# Patient Record
Sex: Male | Born: 1955
Health system: Southern US, Community
[De-identification: ages and names within clinical notes are randomized; demographics above are authoritative.]

## PROBLEM LIST (undated history)

## (undated) DIAGNOSIS — K219 Gastro-esophageal reflux disease without esophagitis: Secondary | ICD-10-CM

## (undated) DIAGNOSIS — G4700 Insomnia, unspecified: Secondary | ICD-10-CM

## (undated) DIAGNOSIS — G4730 Sleep apnea, unspecified: Secondary | ICD-10-CM

## (undated) DIAGNOSIS — K5790 Diverticulosis of intestine, part unspecified, without perforation or abscess without bleeding: Secondary | ICD-10-CM

## (undated) DIAGNOSIS — E785 Hyperlipidemia, unspecified: Secondary | ICD-10-CM

## (undated) DIAGNOSIS — B356 Tinea cruris: Secondary | ICD-10-CM

## (undated) DIAGNOSIS — M1990 Unspecified osteoarthritis, unspecified site: Secondary | ICD-10-CM

## (undated) DIAGNOSIS — J45909 Unspecified asthma, uncomplicated: Secondary | ICD-10-CM

## (undated) DIAGNOSIS — G4733 Obstructive sleep apnea (adult) (pediatric): Secondary | ICD-10-CM

## (undated) DIAGNOSIS — I10 Essential (primary) hypertension: Secondary | ICD-10-CM

## (undated) DIAGNOSIS — Z9989 Dependence on other enabling machines and devices: Secondary | ICD-10-CM

## (undated) DIAGNOSIS — Q539 Undescended testicle, unspecified: Secondary | ICD-10-CM

## (undated) DIAGNOSIS — E119 Type 2 diabetes mellitus without complications: Secondary | ICD-10-CM

## (undated) DIAGNOSIS — N200 Calculus of kidney: Secondary | ICD-10-CM

## (undated) HISTORY — PX: COLONOSCOPY: SHX174

## (undated) HISTORY — DX: Dependence on other enabling machines and devices: Z99.89

## (undated) HISTORY — DX: Type 2 diabetes mellitus without complications: E11.9

## (undated) HISTORY — DX: Sleep apnea, unspecified: G47.30

## (undated) HISTORY — DX: Essential (primary) hypertension: I10

## (undated) HISTORY — DX: Tinea cruris: B35.6

## (undated) HISTORY — DX: Diverticulosis of intestine, part unspecified, without perforation or abscess without bleeding: K57.90

## (undated) HISTORY — DX: Obstructive sleep apnea (adult) (pediatric): G47.33

## (undated) HISTORY — DX: Hyperlipidemia, unspecified: E78.5

## (undated) HISTORY — DX: Unspecified osteoarthritis, unspecified site: M19.90

## (undated) HISTORY — DX: Unspecified asthma, uncomplicated: J45.909

## (undated) HISTORY — DX: Calculus of kidney: N20.0

## (undated) HISTORY — DX: Gastro-esophageal reflux disease without esophagitis: K21.9

## (undated) HISTORY — DX: Undescended testicle, unspecified: Q53.9

## (undated) HISTORY — DX: Insomnia, unspecified: G47.00

---
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
## 2000-11-19 ENCOUNTER — Ambulatory Visit (HOSPITAL_COMMUNITY): Admission: RE | Admit: 2000-11-19 | Discharge: 2000-11-19 | Payer: Self-pay | Admitting: Internal Medicine

## 2000-11-19 ENCOUNTER — Encounter: Payer: Self-pay | Admitting: Internal Medicine

## 2007-08-05 ENCOUNTER — Ambulatory Visit (HOSPITAL_COMMUNITY): Admission: RE | Admit: 2007-08-05 | Discharge: 2007-08-05 | Payer: Self-pay | Admitting: Gastroenterology

## 2007-11-20 LAB — HM COLONOSCOPY

## 2008-09-04 ENCOUNTER — Encounter: Admission: RE | Admit: 2008-09-04 | Discharge: 2008-09-04 | Payer: Self-pay | Admitting: Internal Medicine

## 2008-09-11 ENCOUNTER — Ambulatory Visit (HOSPITAL_COMMUNITY): Admission: RE | Admit: 2008-09-11 | Discharge: 2008-09-11 | Payer: Self-pay | Admitting: Urology

## 2008-09-13 ENCOUNTER — Ambulatory Visit (HOSPITAL_BASED_OUTPATIENT_CLINIC_OR_DEPARTMENT_OTHER): Admission: RE | Admit: 2008-09-13 | Discharge: 2008-09-13 | Payer: Self-pay | Admitting: Urology

## 2008-09-13 HISTORY — PX: OTHER SURGICAL HISTORY: SHX169

## 2008-09-22 ENCOUNTER — Ambulatory Visit (HOSPITAL_COMMUNITY): Admission: RE | Admit: 2008-09-22 | Discharge: 2008-09-22 | Payer: Self-pay | Admitting: Urology

## 2008-09-28 ENCOUNTER — Ambulatory Visit (HOSPITAL_COMMUNITY): Admission: RE | Admit: 2008-09-28 | Discharge: 2008-09-28 | Payer: Self-pay | Admitting: Urology

## 2010-04-17 ENCOUNTER — Encounter: Payer: Self-pay | Admitting: Internal Medicine

## 2010-04-19 LAB — POCT I-STAT, CHEM 8
Calcium, Ion: 1.21 mmol/L (ref 1.12–1.32)
HCT: 52 % (ref 39.0–52.0)
Hemoglobin: 17.7 g/dL — ABNORMAL HIGH (ref 13.0–17.0)
TCO2: 22 mmol/L (ref 0–100)

## 2010-04-19 LAB — GLUCOSE, CAPILLARY
Glucose-Capillary: 116 mg/dL — ABNORMAL HIGH (ref 70–99)
Glucose-Capillary: 142 mg/dL — ABNORMAL HIGH (ref 70–99)

## 2010-04-24 ENCOUNTER — Encounter: Payer: Self-pay | Admitting: Internal Medicine

## 2010-04-24 ENCOUNTER — Ambulatory Visit (INDEPENDENT_AMBULATORY_CARE_PROVIDER_SITE_OTHER): Payer: Commercial Managed Care - PPO | Admitting: Internal Medicine

## 2010-04-24 DIAGNOSIS — G4733 Obstructive sleep apnea (adult) (pediatric): Secondary | ICD-10-CM

## 2010-04-24 DIAGNOSIS — G4700 Insomnia, unspecified: Secondary | ICD-10-CM

## 2010-04-24 NOTE — Progress Notes (Signed)
  Subjective:    Patient ID: Christopher Stewart, male    DOB: [birth year], 55 y.o.   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 He was aware of loud snore, witnessed apneas and daytime tiredness. A standard NPSG done at [facility] Sleep lab 10/18/09 showed AHI 52.1/ hr. CPAP was titrated to 9 cwp with residual AHI of 12.5/hr. Sleep efficiency was poor, with fragmentation. Interpretation was out-sourced, and he didn't have physician f/u. He is using CPAP every night, but it disturbs his wife, who has insomnia, so they sleep in separate rooms. Full face mask.  Sleep hygiene and schedule reviewed. He complains of difficulty maintaining sleep, "busy brain". In past he had used resoril 15 mg, then 1 mg Ativan. Ambien caused headache.    Review of Systems See HPI Constitutional:   No weight loss, night sweats,  Fevers, chills, fatigue, lassitude. HEENT:   No headaches,  Difficulty swallowing,  Tooth/dental problems,  Sore throat,                No sneezing, itching, ear ache, nasal congestion, post nasal drip,   CV:  No chest pain,  Orthopnea, PND, swelling in lower extremities, anasarca, dizziness, palpitations  GI  No heartburn, indigestion, abdominal pain, nausea, vomiting, diarrhea, change in bowel habits, loss of appetite  Resp: No shortness of breath with exertion or at rest.  No excess mucus, no productive cough,  No non-productive cough,  No coughing up of blood.  No change in color of mucus.  No wheezing.  Skin: no rash or lesions.  GU: no dysuria, change in color of urine, no urgency or frequency.  No flank pain.  MS:  No joint pain or swelling.  No decreased range of motion.  No back pain.  Psych:  No change in mood or affect. No depression or anxiety.  No memory loss.      Objective:   Physical Exam General- Alert, Oriented, Affect-appropriate, Distress- none acute. Overweight  Skin- rash-none, lesions- none,  excoriation- none  Lymphadenopathy- none  Head- atraumatic  Eyes- Gross vision intact, PERRLA, conjunctivae clear, secretions  Ears- Hearing normal canals, Tm L ,   R ,  Nose- Clear, No -Septal dev, but narrow on right, mucus, polyps, erosion, perforation   Throat- Mallampati II-III , mucosa clear , drainage- none, tonsils- atrophic  Neck- flexible , trachea midline, no stridor , thyroid nl, carotid no bruit  Chest - symmetrical excursion , unlabored     Heart/CV- RRR , no murmur , no gallop  , no rub, nl s1 s2                     - JVD- none , edema- none, stasis changes- none, varices- none     Lung- clear to P&A, wheeze- none, cough- none , dullness-none, rub- none     Chest wall- Abd- tender-no, distended-no, bowel sounds-present, HSM- no  Br/ Gen/ Rectal- Not done, not indicated  Extrem- cyanosis- none, clubbing, none, atrophy- none, strength- nl  Neuro- grossly intact to observation         Assessment & Plan:

## 2010-04-24 NOTE — Patient Instructions (Signed)
Continue CPAP at 9 CWP. Contact [facility] for any comfort or mechanical issues. If they can't help then call me.   Samples Lunesta 2 mg- call for script if you like it

## 2010-04-27 ENCOUNTER — Encounter: Payer: Self-pay | Admitting: Internal Medicine

## 2010-04-27 DIAGNOSIS — G4733 Obstructive sleep apnea (adult) (pediatric): Secondary | ICD-10-CM

## 2010-04-27 DIAGNOSIS — G4700 Insomnia, unspecified: Secondary | ICD-10-CM

## 2010-04-27 HISTORY — DX: Insomnia, unspecified: G47.00

## 2010-04-27 HISTORY — DX: Obstructive sleep apnea (adult) (pediatric): G47.33

## 2010-04-27 NOTE — Assessment & Plan Note (Signed)
He is getting used to CPAP 9 with full face mask. We don't see a reason to change now, but discussed the medical issues and available treatments for sleep apnea.

## 2010-04-27 NOTE — Assessment & Plan Note (Signed)
He notes difficulty maintaining sleep. This has persisted despite adjustment to CPAP and is probably a separate diagnosis. We reviewed sleep hygiene and have given samples Lunesta 2 mg.

## 2010-05-28 NOTE — Op Note (Signed)
NAME:  Christopher Stewart, Christopher Stewart NO.:  [id number]   MEDICAL RECORD NO.:  [id number]          PATIENT TYPE:  AMB   LOCATION:  [facility]                         FACILITY:  [facility]   PHYSICIAN:  Lindaann Slough, M.D.  DATE OF BIRTH:  [birth year]   DATE OF PROCEDURE:  09/13/2008  DATE OF DISCHARGE:                               OPERATIVE REPORT   PREOPERATIVE DIAGNOSIS:  Left ureteropelvic junction stone and left  ureterovesical junction stone, status post extracorporeal shock wave  lithotripsy.   POSTOPERATIVE DIAGNOSIS:  Left ureteropelvic junction stone and left  ureterovesical junction stone, status post extracorporeal shock wave  lithotripsy.   PROCEDURE:  Cystoscopy, stone extraction, left retrograde pyelogram, and  insertion of double-J catheter.   SURGEON:  Danae Chen, M.D.   ANESTHESIA:  General.   INDICATIONS:  The patient is a 55 year old male who had ESL of a left  renal stone on August 30.  He was doing well until yesterday when he  started having severe left flank pain.  The pain became worse this  morning and was associated with nausea and vomiting.  Repeat CT scan  showed a stone at the UPJ and a small stone fragment at the UVJ.  He is  scheduled for cystoscopy, stone extraction, retrograde pyelogram and  insertion of double-J stent.   The patient was identified by his wrist band, and proper time-out was  taken.   Under general anesthesia, he was prepped and draped and placed in the  dorsal lithotomy position.  A panendoscope was inserted in the bladder.  The urethra is normal.  The bladder mucosa is normal.  There is a stone  crowning at the UVJ.  There is no stone or tumor in the bladder.  The  ureteral orifices are in normal position and shape.  A nitinol stone  basket was passed through the cystoscope but could not be passed through  the UVJ.  The stone basket was removed, and a sensor tip guidewire was  passed through the cystoscope into the left ureter.   Then the stone  basket was passed in the ureter, and the stone at the UVJ was extracted.  There were 3 small stone fragments at the UVJ, and all of them were  removed.  The stone basket was removed.   Retrograde pyelogram.   An open-ended #5-French ureteral catheter was passed over the guidewire,  and the guidewire was removed.  Contrast was then injected through the  open-ended catheter.  The ureter appears normal.  There is a stone at  the UPJ, and the calices are moderately dilated.  The open-ended  catheter was then removed.   The guidewire was then back-loaded into the cystoscope, and a #6-French  26 double-J catheter was passed over the guidewire.  The proximal curl  of the double-J catheter is in the renal pelvis.  The distal curl is in  the bladder.   The bladder was then emptied, and the cystoscope and guidewire removed.   The patient tolerated the procedure well and left the OR in satisfactory  condition to post anesthesia care unit.  Lindaann Slough, M.D.  Electronically Signed     MN/MEDQ  D:  09/13/2008  T:  09/13/2008  Job:  [id number]

## 2010-06-21 ENCOUNTER — Telehealth: Payer: Self-pay | Admitting: *Deleted

## 2010-06-21 MED ORDER — ESZOPICLONE 2 MG PO TABS: 2.0000 mg | ORAL_TABLET | Freq: Every evening | ORAL | Status: DC | PRN

## 2010-06-21 NOTE — Telephone Encounter (Signed)
Per last OV note-okay to send Rx as requested. Pt aware Rx sent to Outpatient pharmacy.

## 2010-07-25 ENCOUNTER — Encounter: Payer: Self-pay | Admitting: Internal Medicine

## 2010-07-25 ENCOUNTER — Ambulatory Visit (INDEPENDENT_AMBULATORY_CARE_PROVIDER_SITE_OTHER): Payer: Commercial Managed Care - PPO | Admitting: Internal Medicine

## 2010-07-25 VITALS — BP 114/78 | HR 92 | Ht 70.0 in | Wt 304.6 lb

## 2010-07-25 DIAGNOSIS — G4733 Obstructive sleep apnea (adult) (pediatric): Secondary | ICD-10-CM

## 2010-07-25 DIAGNOSIS — G4700 Insomnia, unspecified: Secondary | ICD-10-CM

## 2010-07-25 NOTE — Assessment & Plan Note (Addendum)
He is doing very well with CPAP, but has comfort and fit issues. We discussed use of [employer] as a source for mask supplies.

## 2010-07-25 NOTE — Progress Notes (Signed)
Subjective:    Patient ID: Christopher Stewart, male    DOB: [birth year], 55 y.o.   MRN: [id number]  HPI    Review of Systems     Objective:   Physical Exam        Assessment & Plan:   Subjective:    Patient ID: Christopher Stewart, male    DOB: [birth year], 55 y.o.   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 He was aware of loud snore, witnessed apneas and daytime tiredness. A standard NPSG done at [facility] Sleep lab 10/18/09 showed AHI 52.1/ hr. CPAP was titrated to 9 cwp with residual AHI of 12.5/hr. Sleep efficiency was poor, with fragmentation. Interpretation was out-sourced, and he didn't have physician f/u. He is using CPAP every night, but it disturbs his wife, who has insomnia, so they sleep in separate rooms. Full face mask.  Sleep hygiene and schedule reviewed. He complains of difficulty maintaining sleep, "busy brain". In past he had used restoril 15 mg, then 1 mg Ativan. Ambien caused headache.   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 He is satisfied with CPAP 6.5 used all night every night and sleeping comfortably at this point. He expresses no concerns. Mask fit discussed.   Review of Systems See HPI Constitutional:   No weight loss, night sweats,  Fevers, chills, fatigue, lassitude. HEENT:   No headaches,  Difficulty swallowing,  Tooth/dental problems,  Sore throat,                No sneezing, itching, ear ache, nasal congestion, post nasal drip,   CV:  No chest pain,  Orthopnea, PND, swelling in lower extremities, anasarca, dizziness, palpitations  GI  No heartburn, indigestion, abdominal pain, nausea, vomiting, diarrhea, change in bowel habits, loss of appetite  Resp: No shortness of breath with exertion or at rest.  No excess mucus, no productive cough,  No non-productive cough,  No coughing up of blood.  No change  in color of mucus.  No wheezing.  Skin: no rash or lesions.  GU: no dysuria, change in color of urine, no urgency or frequency.  No flank pain.  MS:  No joint pain or swelling.  No decreased range of motion.  No back pain.  Psych:  No change in mood or affect. No depression or anxiety.  No memory loss.      Objective:   Physical Exam General- Alert, Oriented, Affect-appropriate, Distress- none acute. Overweight  Skin- rash-none, lesions- none, excoriation- none  Lymphadenopathy- none  Head- atraumatic  Eyes- Gross vision intact, PERRLA, conjunctivae clear secretions  Ears- Hearing normal canals, Tm- normal  Nose- Clear, No -Septal dev, but narrow on right, mucus, polyps, erosion, perforation   Throat- Mallampati II-III , mucosa clear , drainage- none, tonsils- atrophic  Neck- flexible , trachea midline, no stridor , thyroid nl, carotid no bruit  Chest - symmetrical excursion , unlabored     Heart/CV- RRR , no murmur , no gallop  , no rub, nl s1 s2                     - JVD- none , edema- none, stasis changes- none, varices- none     Lung- clear to P&A, wheeze- none, cough- none , dullness-none, rub- none     Chest wall- Abd- tender-no, distended-no, bowel sounds-present, HSM- no  Br/ Gen/ Rectal-  Not done, not indicated  Extrem- cyanosis- none, clubbing, none, atrophy- none, strength- nl  Neuro- grossly intact to observation         Assessment & Plan:

## 2010-07-25 NOTE — Patient Instructions (Signed)
Try [employer] as a source/ reference for CPAP supplies outside the DME/ insurance circuit.

## 2010-07-28 NOTE — Assessment & Plan Note (Signed)
Sleep hygiene discussed. Previously unsuccessfull with restoril, Remus Loffler, lunesta.

## 2010-12-25 IMAGING — CT CT ABDOMEN W/O CM
3 series · 12 of 36 positions shown, 18 images · non-contrast
Comparison: None.

CT ABDOMEN

CLINICAL DATA: Microhematuria with left side pain.  Question
kidney stones.

CT ABDOMEN AND PELVIS WITHOUT CONTRAST
TECHNIQUE: Multidetector CT imaging of the abdomen and pelvis was
performed following the standard protocol without intravenous
contrast.

[Series 3: renal stone · axial · 0.98mm/px · z∈[-457,-97]mm · 8 of 94 slices shown, 13 images]
[im 11/94  soft-tissue]
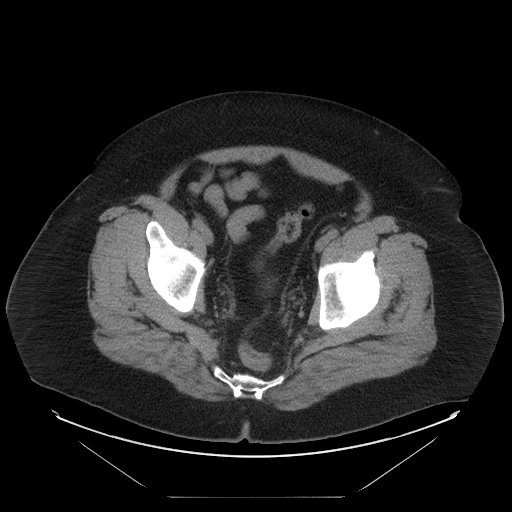
[im 11/94  bone]
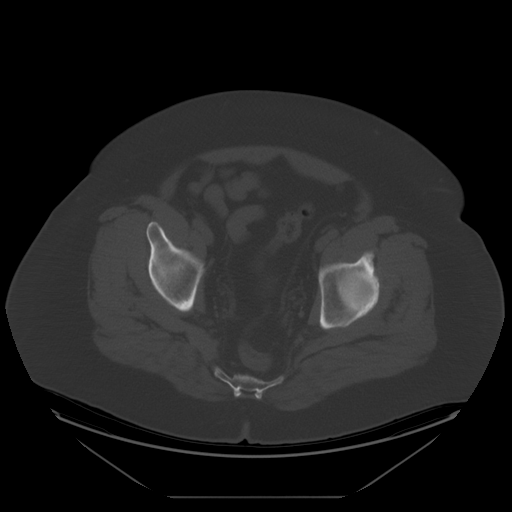
[im 21/94  soft-tissue]
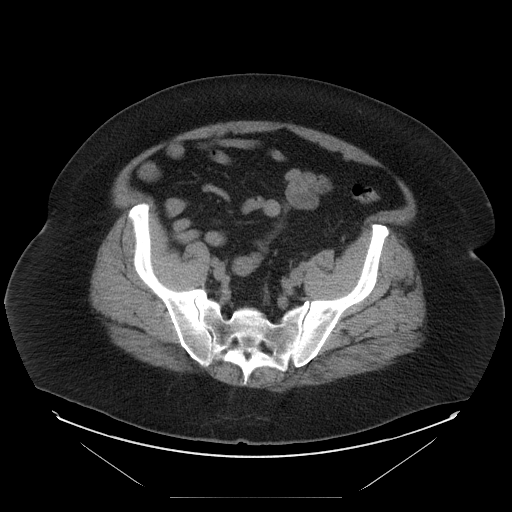
[im 32/94  soft-tissue]
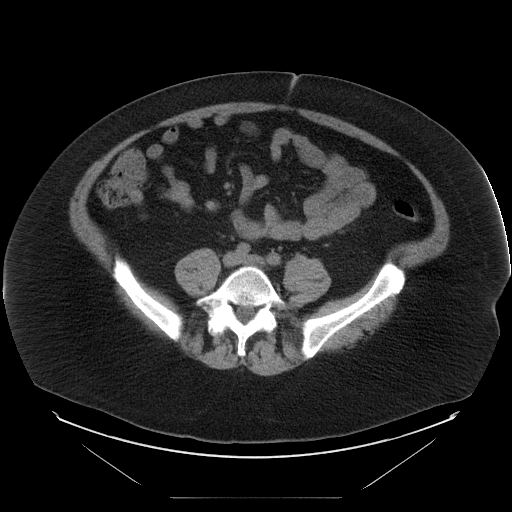
[im 42/94  soft-tissue]
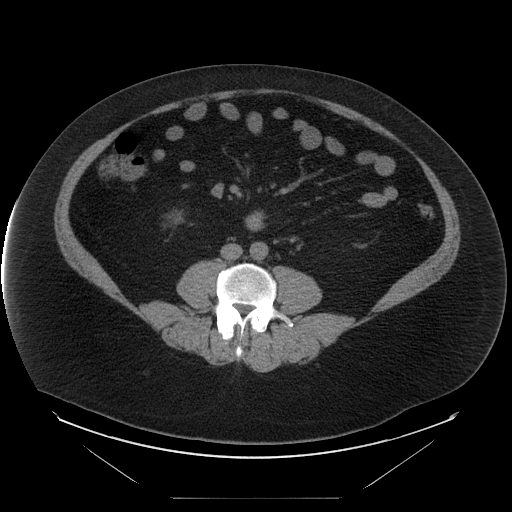
[im 52/94  soft-tissue]
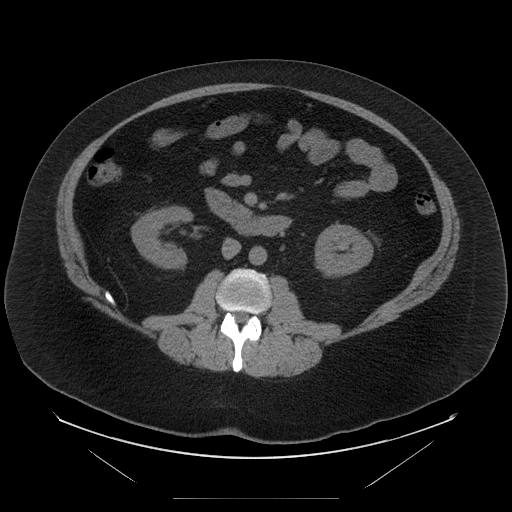
[im 52/94  lung]
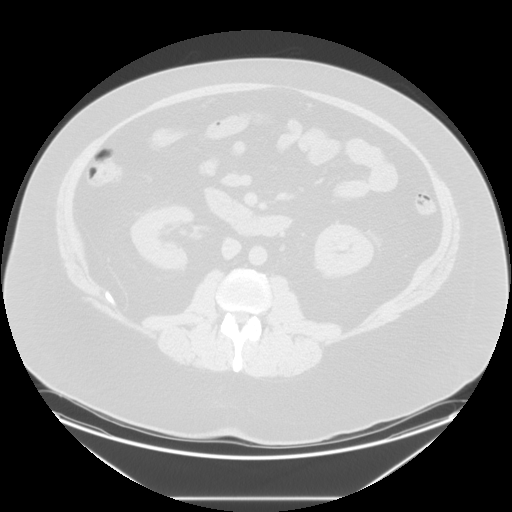
[im 63/94  soft-tissue]
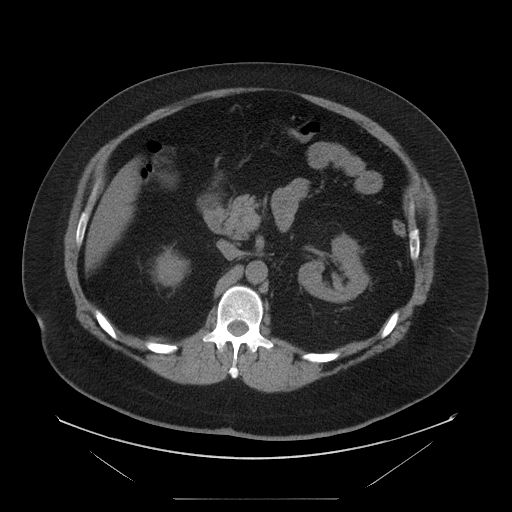
[im 63/94  lung]
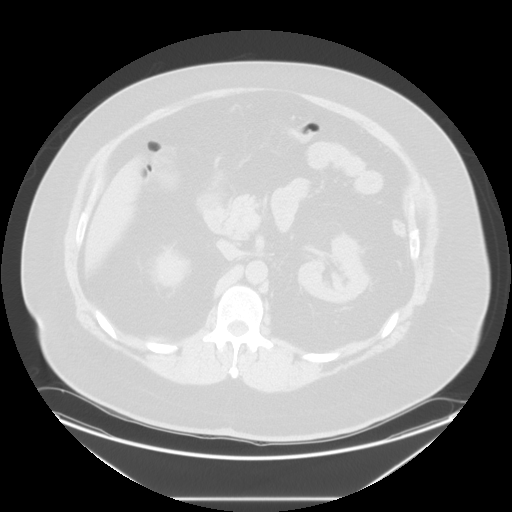
[im 73/94  soft-tissue]
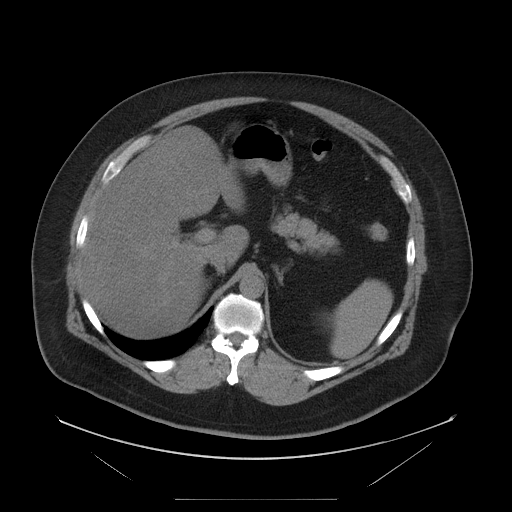
[im 73/94  lung]
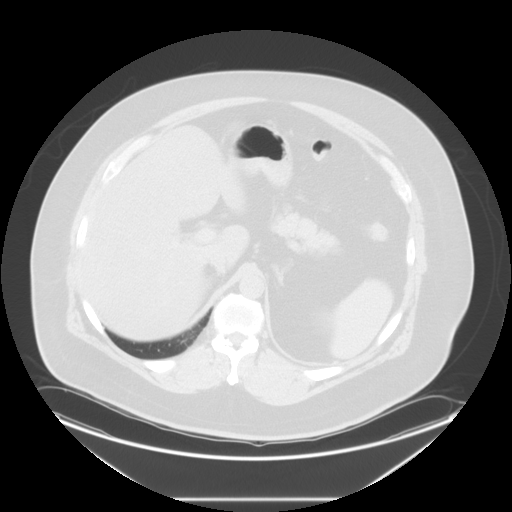
[im 83/94  soft-tissue]
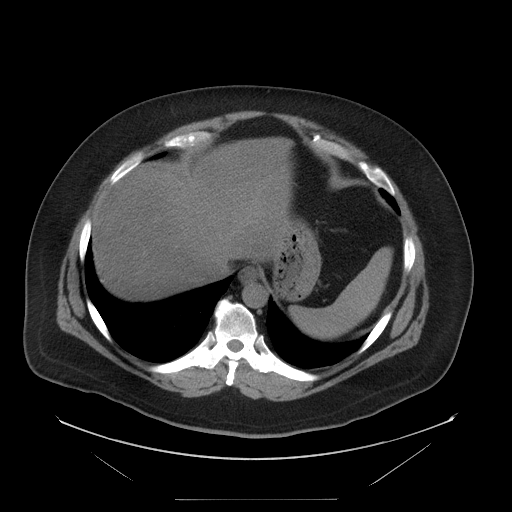
[im 83/94  lung]
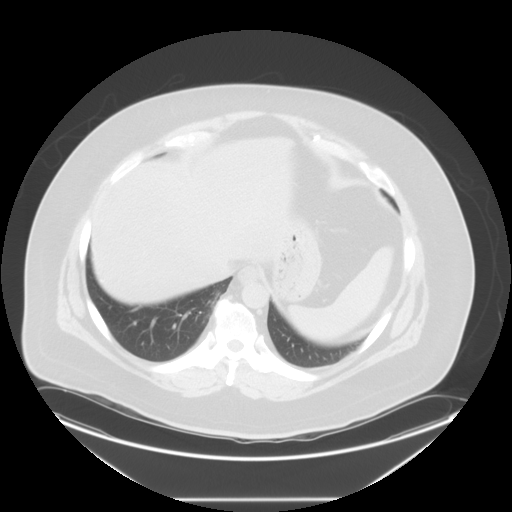

[Series 601: coronal body · coronal · 0.98mm/px · 1 of 153 slices shown, 2 images]
[im 51/153  soft-tissue]
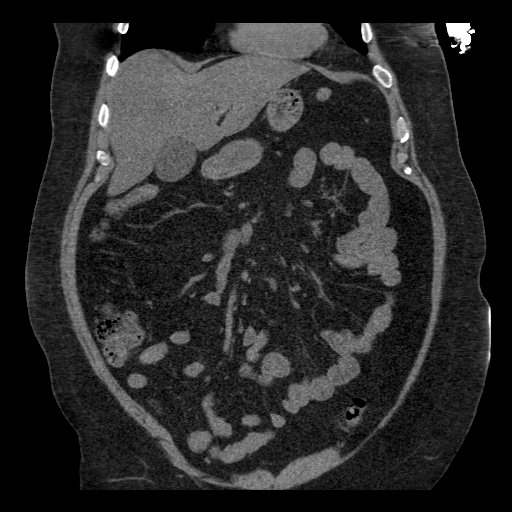
[im 51/153  bone]
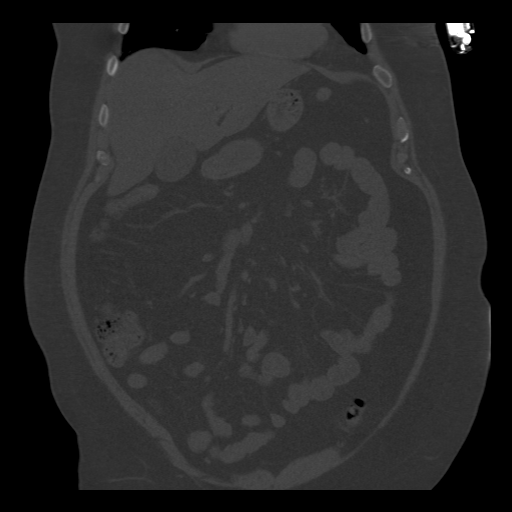

[Series 602: sagittal body · sagittal · 0.98mm/px · 3 of 200 slices shown]
[im 19/200  soft-tissue]
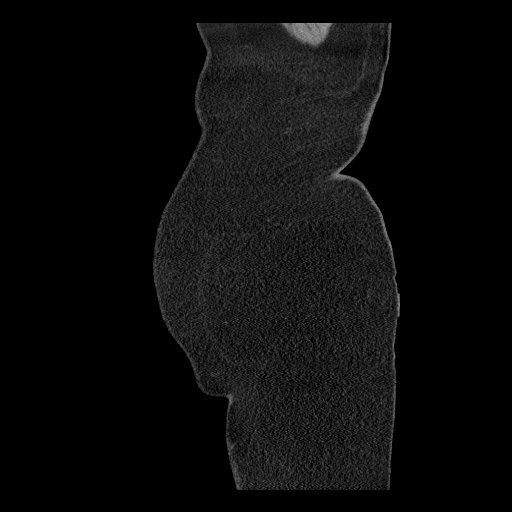
[im 38/200  soft-tissue]
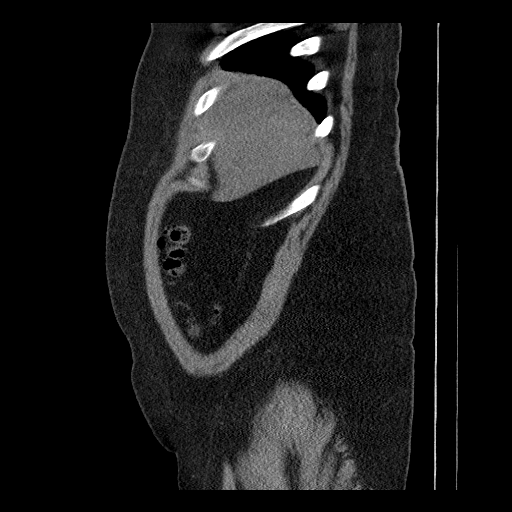
[im 67/200  soft-tissue]
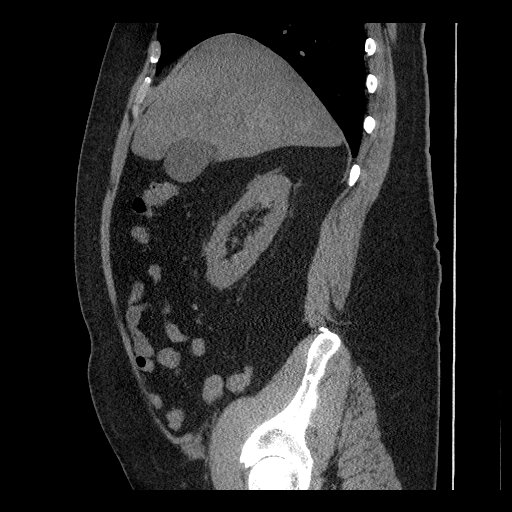

[12 of 36 positions shown; findings below may reference images not displayed]

FINDINGS: Lung bases show a pair of tiny nodules in the lower
lingula measuring up to 3 mm in diameter.

There is diffuse fatty infiltration of the liver with some
geographic sparing along the gallbladder fossa.  No focal
abnormalities seen in the spleen on this study performed without
intravenous contrast material.  The stomach, duodenum, pancreas,
gallbladder, adrenal glands, and right kidney are unremarkable.

7 mm stone is identified in the left renal pelvis, at the UPJ.  A
second 6 mm stone is identified in the lower pole of the left
kidney.  There is some mild perinephric and peripelvic edema on the
left.

No abdominal aortic aneurysm.  No abdominal lymphadenopathy.  No
intraperitoneal free fluid.  The abdominal bowel loops have normal
imaging features.
IMPRESSION: To stones are identified in the left kidney.  One is at the UPJ
causing mild secondary changes in the fat of the renal hilum.  A
second stone is in the lower pole of the left kidney.  There is no
overt hydronephrosis on the left.

Fatty infiltration of the liver.

Two tiny lung nodules are identified in the lingula. If the patient
is at high risk for bronchogenic carcinoma, follow-up chest CT at 1
year is recommended.  If the patient is at low risk, no follow-up
is needed.  This recommendation follows the consensus statement:
"Guidelines for Management of Small Pulmonary Nodules Detected on
CT Scans:  A Statement from the [HOSPITAL]" as published in
[URL]

CT PELVIS
FINDINGS: No stones are present within either ureter or the
urinary bladder.

Bladder is decompressed.  Prostate gland is unremarkable.
Scattered diverticuli are seen in the sigmoid colon without
diverticulitis.  The terminal ileum is normal.  The appendix is not
well seen, but there is no evidence for inflammatory change in the
right lower quadrant.

Bone windows show some degenerative facet disease in the lower
lumbar spine on the right.  No worrisome lytic or sclerotic osseous
lesion.
IMPRESSION: No acute findings in the pelvis.

## 2011-01-01 IMAGING — CR DG ABDOMEN 1V
2 series · 2 of 2 positions shown · non-contrast
Comparison: CT 09/04/2008.

CLINICAL DATA: Left UPJ calculus.

ABDOMEN - 1 VIEW

[t abdomen supine *]
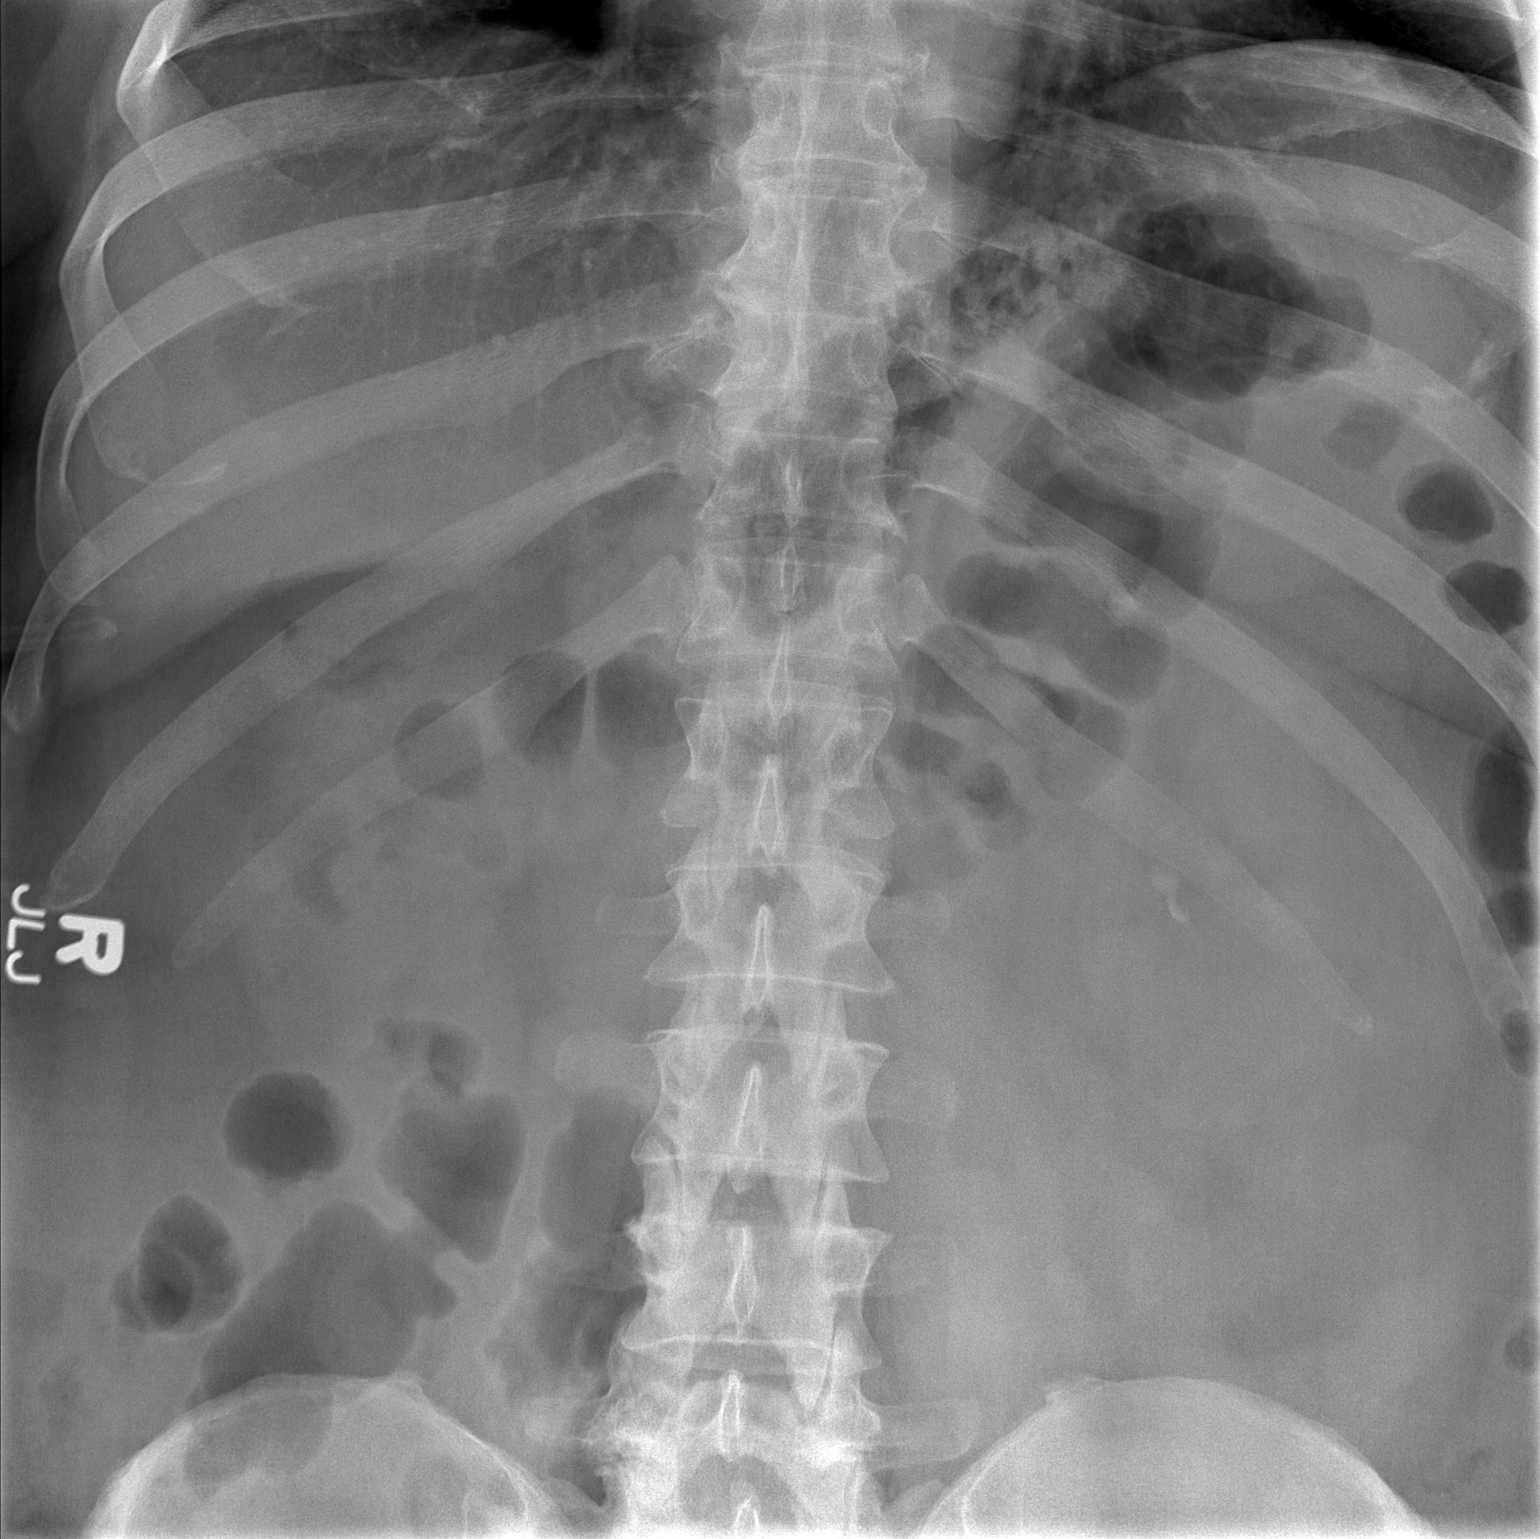

[t abdomen supine]
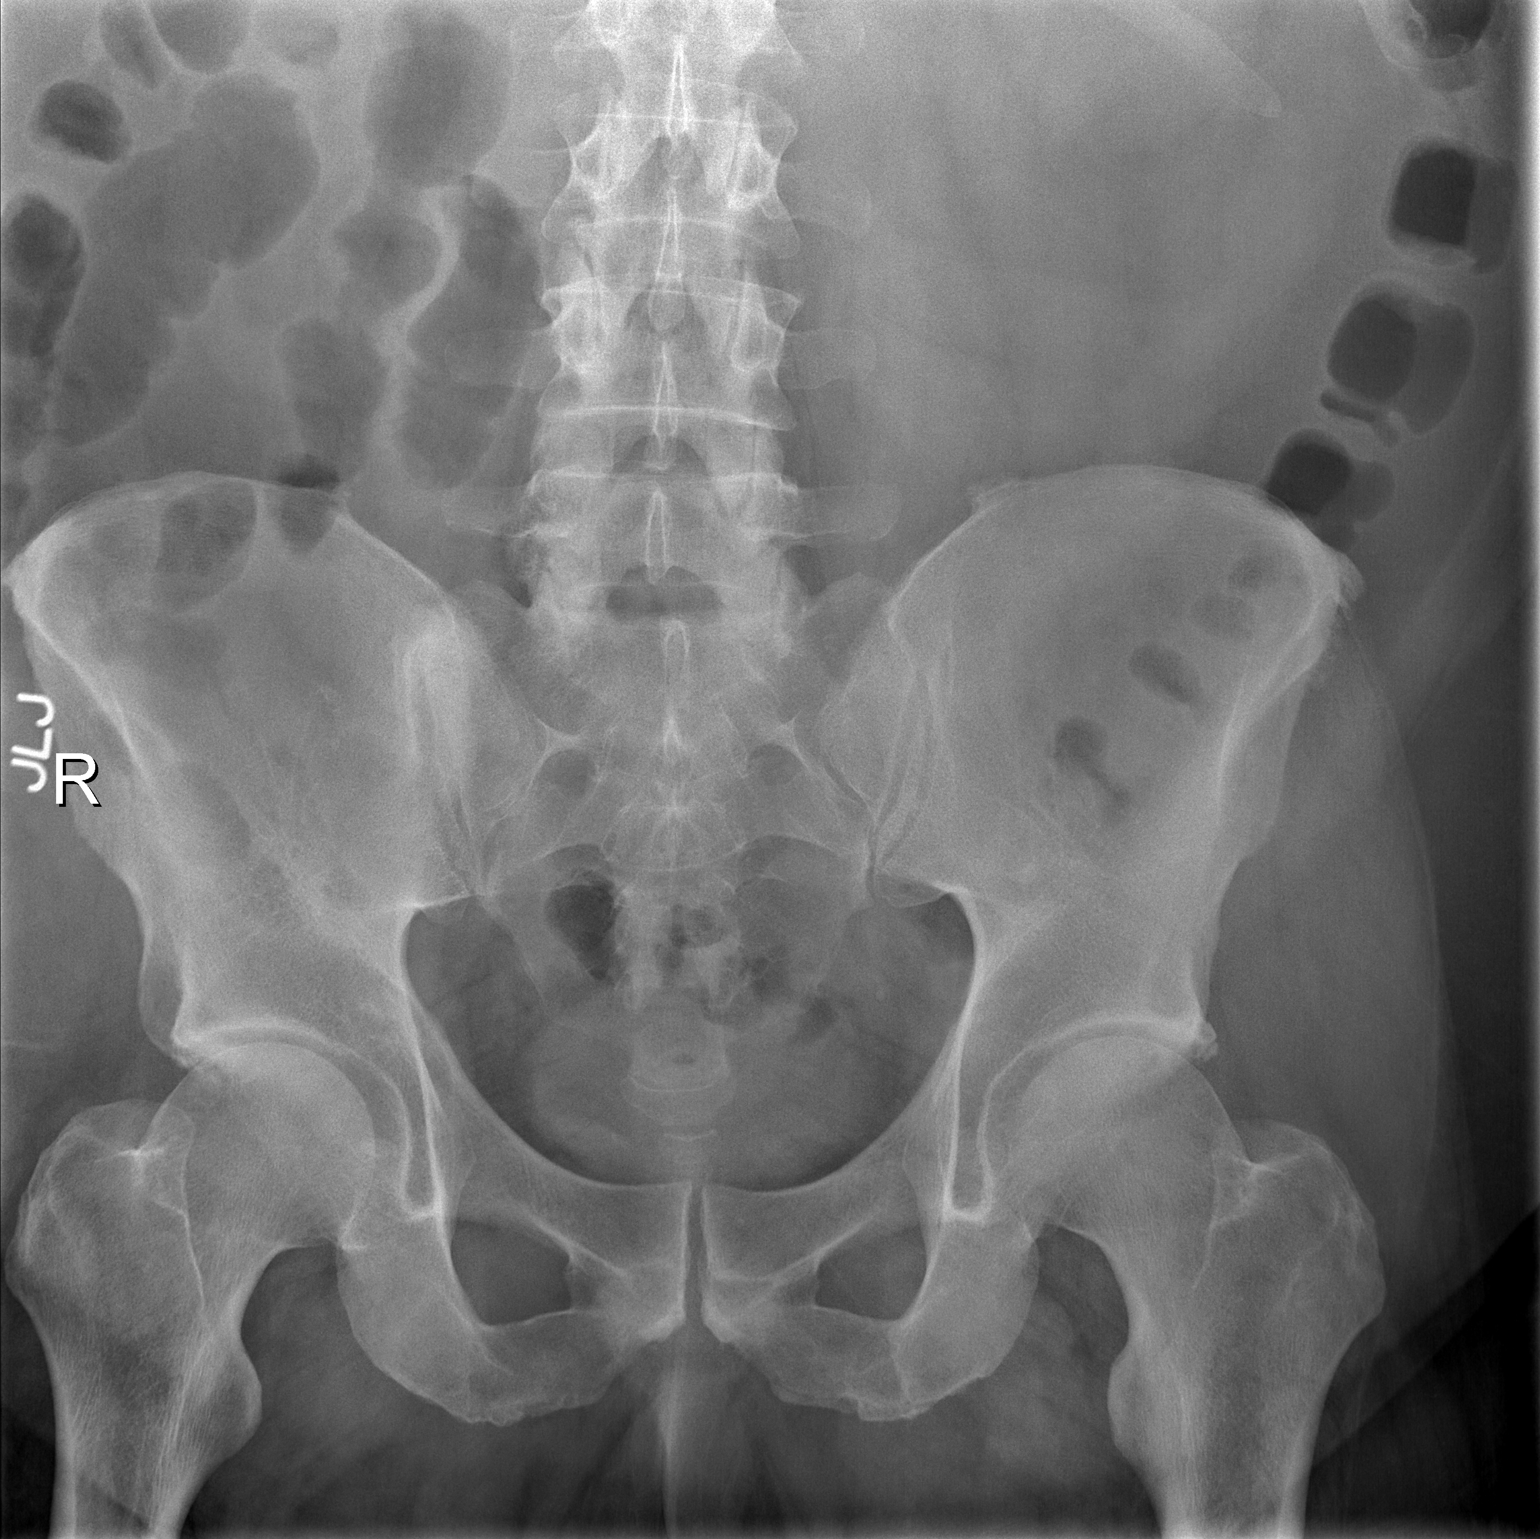

[2 of 2 positions shown; findings below may reference images not displayed]

FINDINGS: The recent CT showed a calculus at the UPJ and a second
calculus and a left lower pole calix.

On the current radiograph, there appear to be two calculi in the
left lower pole calix.  The UPJ stone may have migrated back into
the calix.

Bowel gas pattern is normal.  No acute bony abnormality.  No
calculi on the right.
IMPRESSION: Two calculi are present overlying the left kidney.  These may be in
the lower pole calix.  Each stone measures approximately 7 mm.

## 2011-02-13 ENCOUNTER — Telehealth: Payer: Self-pay | Admitting: Allergy

## 2011-02-13 MED ORDER — ESZOPICLONE 2 MG PO TABS: 2.0000 mg | ORAL_TABLET | Freq: Every evening | ORAL | Status: DC | PRN

## 2011-02-13 NOTE — Telephone Encounter (Signed)
Per [facility] to refill as requested x 5.

## 2011-02-13 NOTE — Telephone Encounter (Signed)
[facility] REQUESTING LUNESTA 2MG  TAKE 1 TABLET BY MOUTH AT BEDTIME AS NEEDED #30 Allergies  Allergen Reactions  . Horse-Derived Products Hives  . Other     Thimerosal  last fill 12//06/2010 Dr Maple Hudson is this ok to fill

## 2011-02-13 NOTE — Telephone Encounter (Signed)
Rx was called to pharm with 5 rf

## 2011-07-24 ENCOUNTER — Ambulatory Visit (INDEPENDENT_AMBULATORY_CARE_PROVIDER_SITE_OTHER): Payer: 59 | Admitting: Internal Medicine

## 2011-07-24 ENCOUNTER — Encounter: Payer: Self-pay | Admitting: Internal Medicine

## 2011-07-24 VITALS — BP 122/68 | HR 90 | Ht 70.0 in | Wt 208.8 lb

## 2011-07-24 DIAGNOSIS — G4733 Obstructive sleep apnea (adult) (pediatric): Secondary | ICD-10-CM

## 2011-07-24 DIAGNOSIS — G4700 Insomnia, unspecified: Secondary | ICD-10-CM

## 2011-07-24 MED ORDER — ESZOPICLONE 3 MG PO TABS: 3.0000 mg | ORAL_TABLET | Freq: Every day | ORAL | Status: DC

## 2011-07-24 NOTE — Progress Notes (Signed)
Patient ID: Christopher Stewart, male    DOB: 27-Feb-1955, 56 y.o.   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 He was aware of loud snore, witnessed apneas and daytime tiredness. A standard NPSG done at [facility] Sleep lab 10/18/09 showed AHI 52.1/ hr. CPAP was titrated to 9 cwp with residual AHI of 12.5/hr. Sleep efficiency was poor, with fragmentation. Interpretation was out-sourced, and he didn't have physician f/u. He is using CPAP every night, but it disturbs his wife, who has insomnia, so they sleep in separate rooms. Full face mask.  Sleep hygiene and schedule reviewed. He complains of difficulty maintaining sleep, "busy brain". In past he had used restoril 15 mg, then 1 mg Ativan. Ambien caused headache.   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 He is satisfied with CPAP 6.5 used all night every night and sleeping comfortably at this point. He expresses no concerns. Mask fit discussed.   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 Wears CPAP every night for approximately 8-9 hours and pressure working well for patient. We need to verify the pressure setting with [facility]. He feels he can continue using CPAP long term. Lunesta worked less well, like a tolerance. He has been off of it for a week. With 2 mg, he tends to wake after 2-4 hours. We discussed options and sleep hygiene.  ROS-see HPI Constitutional:   No-   weight loss, night sweats, fevers, chills, fatigue, lassitude. HEENT:   No-  headaches, difficulty swallowing, tooth/dental problems, sore throat,       No-  sneezing, itching, ear ache, nasal congestion, post nasal drip,  CV:  No-   chest pain, orthopnea, PND, swelling in lower extremities, anasarca, dizziness, palpitations Resp: No-   shortness of breath with  exertion or at rest.              No-   productive cough,  No non-productive cough,  No- coughing up of blood.              No-   change in color of mucus.  No- wheezing.   Skin: No-   rash or lesions. GI:  No-   heartburn, indigestion, abdominal pain, nausea, vomiting,  GU:  MS:  No-   joint pain or swelling.   Neuro-     nothing unusual Psych:  No- change in mood or affect. No depression or anxiety.  No memory loss.  OBJ- Physical Exam General- Alert, Oriented, Affect-appropriate, Distress- none acute, overweight Skin- rash-none, lesions- none, excoriation- none Lymphadenopathy- none Head- atraumatic            Eyes- Gross vision intact, PERRLA, conjunctivae and secretions clear            Ears- Hearing, canals-normal            Nose- Clear, no-Septal dev, mucus, polyps, erosion, perforation             Throat- Mallampati II-III , mucosa clear , drainage- none, tonsils- atrophic Neck- flexible , trachea midline, no stridor , thyroid nl, carotid no bruit Chest - symmetrical excursion , unlabored           Heart/CV- RRR , no murmur , no gallop  , no rub, nl s1 s2                           -  JVD- none , edema- none, stasis changes- none, varices- none           Lung- clear to P&A, wheeze- none, cough- none , dullness-none, rub- none           Chest wall-  Abd-  Br/ Gen/ Rectal- Not done, not indicated Extrem- cyanosis- none, clubbing, none, atrophy- none, strength- nl Neuro- grossly intact to observation

## 2011-07-24 NOTE — Patient Instructions (Addendum)
We will verify your current CPAP setting with [facility]   Ok to continue Lunesta 2 mg, but we will give you a comparison: \Sample and script Lunesta 3 mg. This may help you sleep through a little more soundly.

## 2011-07-30 NOTE — Assessment & Plan Note (Signed)
We reviewed sleep hygiene. Try increasing Lunesta to 3 mg

## 2011-07-30 NOTE — Assessment & Plan Note (Signed)
He thinks his pressure is set at 9, but we will verify. He describes good compliance and control with no complaints from his wife about snoring.

## 2012-02-09 ENCOUNTER — Other Ambulatory Visit: Payer: Self-pay | Admitting: Internal Medicine

## 2012-02-10 ENCOUNTER — Telehealth: Payer: Self-pay | Admitting: Internal Medicine

## 2012-02-10 NOTE — Telephone Encounter (Signed)
Per Florentina Addison, this is for Christopher Stewart is taking care of this & nothing further needed at this time.  Christopher Stewart

## 2012-07-21 ENCOUNTER — Ambulatory Visit: Payer: 59 | Admitting: Pulmonary Disease

## 2012-07-22 ENCOUNTER — Encounter: Payer: Self-pay | Admitting: Internal Medicine

## 2012-07-22 ENCOUNTER — Ambulatory Visit (INDEPENDENT_AMBULATORY_CARE_PROVIDER_SITE_OTHER): Payer: 59 | Admitting: Internal Medicine

## 2012-07-22 VITALS — BP 130/82 | HR 79 | Ht 70.0 in | Wt 298.8 lb

## 2012-07-22 DIAGNOSIS — G4733 Obstructive sleep apnea (adult) (pediatric): Secondary | ICD-10-CM

## 2012-07-22 MED ORDER — ESZOPICLONE 3 MG PO TABS: ORAL_TABLET | ORAL | Status: DC

## 2012-07-22 NOTE — Progress Notes (Signed)
Patient ID: Christopher Stewart, male    DOB: 1955-08-31, 57 y.o.   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 He was aware of loud snore, witnessed apneas and daytime tiredness. A standard NPSG done at [facility] Sleep lab 10/18/09 showed AHI 52.1/ hr. CPAP was titrated to 9 cwp with residual AHI of 12.5/hr. Sleep efficiency was poor, with fragmentation. Interpretation was out-sourced, and he didn't have physician f/u. He is using CPAP every night, but it disturbs his wife, who has insomnia, so they sleep in separate rooms. Full face mask.  Sleep hygiene and schedule reviewed. He complains of difficulty maintaining sleep, "busy brain". In past he had used restoril 15 mg, then 1 mg Ativan. Ambien caused headache.   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 He is satisfied with CPAP 6.5 used all night every night and sleeping comfortably at this point. He expresses no concerns. Mask fit discussed.   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 Wears CPAP every night for approximately 8-9 hours and pressure working well for patient. We need to verify the pressure setting with [facility]. He feels he can continue using CPAP long term. Lunesta worked less well, like a tolerance. He has been off of it for a week. With 2 mg, he tends to wake after 2-4 hours. We discussed options and sleep hygiene.  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 follows for:  wearing cpap 9/ [facility] nightly for about 8-9 hours and pt feels that the pressure is doing very well for him. fullface mask and humidifier. For insomnia component  uses Lunesta 3 mg as needed  ROS-see HPI Constitutional:   No-   weight loss, night sweats, fevers, chills,  fatigue, lassitude. HEENT:   No-  headaches, difficulty swallowing, tooth/dental problems, sore throat,       No-  sneezing, itching, ear ache, nasal congestion, post nasal drip,  CV:  No-   chest pain, orthopnea, PND, swelling in lower extremities, anasarca, dizziness, palpitations Resp: No-   shortness of breath with exertion or at rest.              No-   productive cough,  No non-productive cough,  No- coughing up of blood.              No-   change in color of mucus.  No- wheezing.   Skin: No-   rash or lesions. GI:  No-   heartburn, indigestion, abdominal pain, nausea, vomiting,  GU:  MS:  No-   joint pain or swelling.   Neuro-     nothing unusual Psych:  No- change in mood or affect. No depression or anxiety.  No memory loss.  OBJ- Physical Exam General- Alert, Oriented, Affect-appropriate, Distress- none acute, overweight Skin- rash-none, lesions- none, excoriation- none Lymphadenopathy- none Head- atraumatic            Eyes- Gross vision intact, PERRLA, conjunctivae and secretions clear            Ears- Hearing, canals-normal            Nose- Clear, no-Septal dev, mucus, polyps, erosion, perforation             Throat- Mallampati II-III , mucosa clear , drainage- none, tonsils-  atrophic Neck- flexible , trachea midline, no stridor , thyroid nl, carotid no bruit Chest - symmetrical excursion , unlabored           Heart/CV- RRR , no murmur , no gallop  , no rub, nl s1 s2                           - JVD- none , edema- none, stasis changes- none, varices- none           Lung- clear to P&A, wheeze- none, cough- none , dullness-none, rub- none           Chest wall-  Abd-  Br/ Gen/ Rectal- Not done, not indicated Extrem- cyanosis- none, clubbing, none, atrophy- none, strength- nl Neuro- grossly intact to observation

## 2012-07-22 NOTE — Patient Instructions (Addendum)
We can continue CPAP/ [facility]  Script refill Lunesta  Please call as needed

## 2012-08-04 NOTE — Assessment & Plan Note (Signed)
We think CPAP pressure is 6.5/[facility] but need to verify that with [facility]. Compliance and reported control are good.

## 2012-11-18 ENCOUNTER — Other Ambulatory Visit: Payer: Self-pay

## 2013-03-02 ENCOUNTER — Other Ambulatory Visit: Payer: Self-pay | Admitting: Internal Medicine

## 2013-03-02 NOTE — Telephone Encounter (Signed)
CY, please advise if okay to refill. Thanks.  

## 2013-03-02 NOTE — Telephone Encounter (Signed)
Ok to refill 

## 2013-03-04 NOTE — Telephone Encounter (Signed)
Called refill to pharmacy voicemail.  

## 2013-07-21 ENCOUNTER — Ambulatory Visit (INDEPENDENT_AMBULATORY_CARE_PROVIDER_SITE_OTHER): Payer: 59 | Admitting: Internal Medicine

## 2013-07-21 ENCOUNTER — Encounter: Payer: Self-pay | Admitting: Internal Medicine

## 2013-07-21 VITALS — BP 132/74 | HR 80 | Ht 70.0 in | Wt 292.0 lb

## 2013-07-21 DIAGNOSIS — G4700 Insomnia, unspecified: Secondary | ICD-10-CM

## 2013-07-21 DIAGNOSIS — G4733 Obstructive sleep apnea (adult) (pediatric): Secondary | ICD-10-CM

## 2013-07-21 NOTE — Patient Instructions (Signed)
We can continue CPAP 9/ [facility]  Order- DME [facility]- download for pressure compliance  We can refill the Lunesta when needed

## 2013-07-21 NOTE — Progress Notes (Signed)
Patient ID: Christopher Stewart, male    DOB: 1955/12/24, 58 y.o.   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 He was aware of loud snore, witnessed apneas and daytime tiredness. A standard NPSG done at [facility] 10/18/09 showed AHI 52.1/ hr. CPAP was titrated to 9 cwp with residual AHI of 12.5/hr. Sleep efficiency was poor, with fragmentation. Interpretation was out-sourced, and he didn't have physician f/u. He is using CPAP every night, but it disturbs his wife, who has insomnia, so they sleep in separate rooms. Full face mask.  Sleep hygiene and schedule reviewed. He complains of difficulty maintaining sleep, "busy brain". In past he had used restoril 15 mg, then 1 mg Ativan. Ambien caused headache.   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 He is satisfied with CPAP 6.5 used all night every night and sleeping comfortably at this point. He expresses no concerns. Mask fit discussed.   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 Wears CPAP every night for approximately 8-9 hours and pressure working well for patient. We need to verify the pressure setting with [facility]. He feels he can continue using CPAP long term. Lunesta worked less well, like a tolerance. He has been off of it for a week. With 2 mg, he tends to wake after 2-4 hours. We discussed options and sleep hygiene.  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 follows for:  wearing cpap 9/ [facility] nightly for about 8-9 hours and pt feels that the pressure is doing very well for him. fullface mask and humidifier. 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 FOLLOWS FOR: DME is [facility]; wears CPAP 9/ [facility] every night for about 8 hours on avg. Weight down 302->298 He feels that he "needs" CPAP and won't sleep without it. No reported snoring through. Lunesta 3 mg when needed has worked well for insomnia  ROS-see HPI Constitutional:   No-   weight loss, night sweats, fevers, chills, fatigue, lassitude. HEENT:   No-  headaches, difficulty swallowing, tooth/dental problems, sore throat,       No-  sneezing, itching, ear ache, nasal congestion, post nasal drip,  CV:  No-   chest pain, orthopnea, PND, swelling in lower extremities, anasarca, dizziness, palpitations Resp: No-   shortness of breath with exertion or at rest.              No-   productive cough,  No non-productive cough,  No- coughing up of blood.              No-   change in color of mucus.  No- wheezing.   Skin: No-   rash or lesions. GI:  No-   heartburn, indigestion, abdominal pain, nausea, vomiting,  GU:  MS:  No-   joint pain or swelling.   Neuro-     nothing unusual Psych:  No- change in mood or affect. No depression or anxiety.  No memory loss.  OBJ- Physical Exam General- Alert, Oriented, Affect-appropriate, Distress- none acute, overweight Skin- rash-none, lesions- none, excoriation- none Lymphadenopathy- none Head- atraumatic  Eyes- Gross vision intact, PERRLA, conjunctivae and secretions clear            Ears- Hearing, canals-normal            Nose- Clear, no-Septal dev, mucus, polyps, erosion, perforation             Throat- Mallampati II-III , mucosa clear , drainage- none, tonsils- atrophic Neck- flexible , trachea midline, no stridor , thyroid nl, carotid no bruit Chest - symmetrical excursion , unlabored           Heart/CV- RRR , no murmur , no gallop  , no rub, nl s1 s2                           - JVD- none , edema- none, stasis changes- none, varices- none           Lung- clear to P&A, wheeze- none, cough- none ,  dullness-none, rub- none           Chest wall-  Abd-  Br/ Gen/ Rectal- Not done, not indicated Extrem- cyanosis- none, clubbing, none, atrophy- none, strength- nl Neuro- grossly intact to observation

## 2013-07-27 ENCOUNTER — Encounter: Payer: Self-pay | Admitting: Internal Medicine

## 2013-09-21 ENCOUNTER — Other Ambulatory Visit: Payer: Self-pay | Admitting: Internal Medicine

## 2013-09-26 ENCOUNTER — Other Ambulatory Visit: Payer: Self-pay

## 2013-09-26 MED ORDER — ESZOPICLONE 3 MG PO TABS: 3.0000 mg | ORAL_TABLET | Freq: Every day | ORAL | Status: DC

## 2013-10-07 NOTE — Assessment & Plan Note (Signed)
Good compliance and control with CPAP 9/ [facility] Plan-download for pressure compliance documentation

## 2013-10-07 NOTE — Assessment & Plan Note (Signed)
Reminded of basic good sleep hygiene Plan-okay to refill Lunesta 3 mg when needed. Appropriate use discussed

## 2014-04-07 ENCOUNTER — Other Ambulatory Visit: Payer: Self-pay | Admitting: Internal Medicine

## 2014-04-10 ENCOUNTER — Telehealth: Payer: Self-pay | Admitting: Internal Medicine

## 2014-04-10 MED ORDER — ESZOPICLONE 3 MG PO TABS: 3.0000 mg | ORAL_TABLET | Freq: Every day | ORAL | Status: DC

## 2014-04-10 NOTE — Telephone Encounter (Signed)
Ok to refill [facility]

## 2014-04-10 NOTE — Telephone Encounter (Signed)
Rx was called in 

## 2014-04-10 NOTE — Telephone Encounter (Signed)
Last OV 07/21/13; next OV 08/03/14; Last refilled: 09/26/2013; ok to refill?  Current Outpatient Prescriptions on File Prior to Visit  Medication Sig Dispense Refill  . allopurinol (ZYLOPRIM) 300 MG tablet Take 300 mg by mouth daily.      Marland Kitchen amLODipine (NORVASC) 5 MG tablet Take 5 mg by mouth daily.      Marland Kitchen aspirin 81 MG tablet Take 81 mg by mouth every other day.     . Eszopiclone 3 MG TABS Take 1 tablet (3 mg total) by mouth at bedtime. Take immediately before bedtime 30 tablet 5  . lisinopril (PRINIVIL,ZESTRIL) 40 MG tablet Take 40 mg by mouth daily.      . metFORMIN (GLUCOPHAGE) 1000 MG tablet Take 1,000 mg by mouth 2 (two) times daily with a meal.      . pantoprazole (PROTONIX) 40 MG tablet Take 40 mg by mouth daily.    . rosuvastatin (CRESTOR) 10 MG tablet Take 10 mg by mouth every other day.     . tamsulosin (FLOMAX) 0.4 MG CAPS capsule Take 0.4 mg by mouth daily after supper.    . terazosin (HYTRIN) 5 MG capsule Take 5 mg by mouth daily.     No current facility-administered medications on file prior to visit.   Allergies  Allergen Reactions  . Horse-Derived Products Hives  . Other     Thimerosal

## 2014-05-30 ENCOUNTER — Other Ambulatory Visit (HOSPITAL_COMMUNITY): Payer: Self-pay | Admitting: Dentistry

## 2014-07-26 ENCOUNTER — Other Ambulatory Visit (HOSPITAL_COMMUNITY)
Admission: RE | Admit: 2014-07-26 | Discharge: 2014-07-26 | Disposition: A | Discharge status: Home / Self Care | Payer: 59 | Source: Ambulatory Visit | Attending: Internal Medicine | Admitting: Internal Medicine

## 2014-07-26 DIAGNOSIS — E119 Type 2 diabetes mellitus without complications: Secondary | ICD-10-CM | POA: Insufficient documentation

## 2014-07-27 ENCOUNTER — Ambulatory Visit: Payer: 59 | Admitting: Internal Medicine

## 2014-07-27 LAB — HEMOGLOBIN A1C
Hgb A1c MFr Bld: 7.1 % — ABNORMAL HIGH (ref 4.8–5.6)
MEAN PLASMA GLUCOSE: 157 mg/dL

## 2014-08-02 ENCOUNTER — Other Ambulatory Visit: Payer: Self-pay | Admitting: Internal Medicine

## 2014-08-02 DIAGNOSIS — G4733 Obstructive sleep apnea (adult) (pediatric): Secondary | ICD-10-CM

## 2014-08-03 ENCOUNTER — Encounter: Payer: Self-pay | Admitting: Internal Medicine

## 2014-08-03 ENCOUNTER — Ambulatory Visit (INDEPENDENT_AMBULATORY_CARE_PROVIDER_SITE_OTHER): Payer: 59 | Admitting: Internal Medicine

## 2014-08-03 VITALS — BP 118/76 | HR 83 | Ht 70.0 in | Wt 291.0 lb

## 2014-08-03 DIAGNOSIS — G4733 Obstructive sleep apnea (adult) (pediatric): Secondary | ICD-10-CM

## 2014-08-03 DIAGNOSIS — G4700 Insomnia, unspecified: Secondary | ICD-10-CM

## 2014-08-03 NOTE — Progress Notes (Signed)
Patient ID: Christopher Stewart, male    DOB: February 05, 1955, 59 y.o.   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 He was aware of loud snore, witnessed apneas and daytime tiredness. A standard NPSG done at [facility] 10/18/09 showed AHI 52.1/ hr. CPAP was titrated to 9 cwp with residual AHI of 12.5/hr. Sleep efficiency was poor, with fragmentation. Interpretation was out-sourced, and he didn't have physician f/u. He is using CPAP every night, but it disturbs his wife, who has insomnia, so they sleep in separate rooms. Full face mask.  Sleep hygiene and schedule reviewed. He complains of difficulty maintaining sleep, "busy brain". In past he had used restoril 15 mg, then 1 mg Ativan. Ambien caused headache.   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 He is satisfied with CPAP 6.5 used all night every night and sleeping comfortably at this point. He expresses no concerns. Mask fit discussed.   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 Wears CPAP every night for approximately 8-9 hours and pressure working well for patient. We need to verify the pressure setting with [facility]. He feels he can continue using CPAP long term. Lunesta worked less well, like a tolerance. He has been off of it for a week. With 2 mg, he tends to wake after 2-4 hours. We discussed options and sleep hygiene.  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 follows for:  wearing cpap 9/ [facility] nightly for about 8-9 hours and pt feels that the pressure is doing very well for him. fullface mask and humidifier. 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 FOLLOWS FOR: DME is [facility]; wears CPAP 9/ [facility] every night for about 8 hours on avg. Weight down 302->298 He feels that he "needs" CPAP and won't sleep without it. No reported snoring through. Lunesta 3 mg when needed has worked well for insomnia  08/03/14- 59 yoM, never smoker , hospital -based dentist, followed for OSA with Insomnia FOLLOWS FOR: DME is [facility]-pt went this morning drop off card for download. Pt continues to wear CPAP 9/ [facility]. Download confirms excellent use/compliance and control at a pressure of 9. He is quite comfortable. Unfortunately he has not lost weight but otherwise he is doing extremely well. Still needs occasional Lunesta 3 mg, but not often  ROS-see HPI Constitutional:   No-   weight loss, night sweats, fevers, chills, fatigue, lassitude. HEENT:   No-  headaches, difficulty swallowing, tooth/dental problems, sore throat,       No-  sneezing, itching, ear ache, nasal congestion, post nasal drip,  CV:  No-   chest pain, orthopnea, PND, swelling in lower extremities, anasarca, dizziness, palpitations Resp: No-   shortness of breath with exertion or at rest.              No-   productive cough,  No non-productive cough,  No- coughing up of blood.              No-   change in color of mucus.  No- wheezing.   Skin: No-   rash or lesions. GI:  No-   heartburn, indigestion, abdominal pain,  nausea, vomiting,  GU:  MS:  No-   joint pain or swelling.   Neuro-     nothing unusual Psych:  No- change in mood or affect. No depression or anxiety.  No memory loss.  OBJ- Physical Exam General- Alert, Oriented, Affect-appropriate, Distress- none acute, overweight Skin- rash-none, lesions- none, excoriation- none Lymphadenopathy- none Head- atraumatic            Eyes- Gross vision intact, PERRLA, conjunctivae and secretions clear            Ears- Hearing, canals-normal            Nose- Clear, no-Septal dev, mucus, polyps, erosion,  perforation             Throat- Mallampati II-III , mucosa clear , drainage- none, tonsils- atrophic Neck- flexible , trachea midline, no stridor , thyroid nl, carotid no bruit Chest - symmetrical excursion , unlabored           Heart/CV- RRR , no murmur , no gallop  , no rub, nl s1 s2                           - JVD- none , edema- none, stasis changes- none, varices- none           Lung- clear to P&A, wheeze- none, cough- none , dullness-none, rub- none           Chest wall-  Abd-  Br/ Gen/ Rectal- Not done, not indicated Extrem- cyanosis- none, clubbing, none, atrophy- none, strength- nl Neuro- grossly intact to observation

## 2014-08-03 NOTE — Telephone Encounter (Signed)
Will forward to [facility] as an [place]

## 2014-08-03 NOTE — Patient Instructions (Signed)
We can continue CPAP 9/ [facility]  We can refill lunesta when you call

## 2014-08-05 NOTE — Assessment & Plan Note (Signed)
CPAP compliance and control are excellent pressure 9 and continued to be necessary.

## 2014-08-05 NOTE — Assessment & Plan Note (Signed)
Sleep hygiene is appropriate. No problem with very occasional use of Lunesta.

## 2014-08-21 ENCOUNTER — Encounter: Payer: Self-pay | Admitting: Internal Medicine

## 2014-11-01 ENCOUNTER — Encounter: Payer: Self-pay | Admitting: Internal Medicine

## 2014-11-01 MED ORDER — ESZOPICLONE 3 MG PO TABS: 3.0000 mg | ORAL_TABLET | Freq: Every day | ORAL | Status: DC

## 2014-11-01 NOTE — Telephone Encounter (Signed)
10.19.16 mychart message from patient: Message     Dr. Annamaria Boots:    I need a new Rx with refills for Lunesta 3mg  #30 Take one at bedtime.    Please call or fax to [facility] Outpatient pharmacy.        Dr. Jorja Loa      Pt last seen by CY on 7.21.16: Patient Instructions     We can continue CPAP 9/ [facility]  We can refill lunesta when you call   Lunesta 3mg  last refilled 3.28.16 #30 with 5 additional refills Will go ahead and send refills to pt's requested pharmacy  Rx telephoned to pharmacist Aaron Edelman at the [facility] list updated and email sent to patient informing him

## 2014-11-15 LAB — HM DEXA SCAN

## 2015-01-16 MED FILL — predniSONE 5 MG (21) TBPK: 5 | 6 days supply | Qty: 21 | Fill #0

## 2015-01-22 MED FILL — PANTOPRAZOLE SOD DR 40 MG T: 40 | 90 days supply | Qty: 90 | Fill #2

## 2015-02-05 MED FILL — ESZOPICLONE 3 MG TABLET: 3 | 30 days supply | Qty: 30 | Fill #3

## 2015-02-05 MED FILL — predniSONE 5 MG (21) TBPK: 5 | 6 days supply | Qty: 21 | Fill #1

## 2015-02-23 MED FILL — ALLOPURINOL 300 MG TABLET: 300 | 90 days supply | Qty: 90 | Fill #0

## 2015-02-23 MED FILL — metFORMIN HCL 1000 MG TABS: 1000 | 90 days supply | Qty: 180 | Fill #0

## 2015-02-23 MED FILL — LISINOPRIL 40 MG TABLET: 40 | 90 days supply | Qty: 90 | Fill #2

## 2015-02-23 MED FILL — ROSUVASTATIN CALCIUM 10 MG: 10 | 90 days supply | Qty: 45 | Fill #1

## 2015-03-05 MED FILL — ESZOPICLONE 3 MG TABLET: 3 | 30 days supply | Qty: 30 | Fill #4

## 2015-03-19 MED FILL — AMLODIPINE BESYLATE 5 MG TA: 5 | 90 days supply | Qty: 90 | Fill #1

## 2015-04-06 MED FILL — ESZOPICLONE 3 MG TABLET: 3 | 30 days supply | Qty: 30 | Fill #5

## 2015-04-18 MED FILL — TERAZOSIN 5 MG CAPSULE: 5 | 90 days supply | Qty: 90 | Fill #1

## 2015-04-18 MED FILL — PANTOPRAZOLE SOD DR 40 MG T: 40 | 90 days supply | Qty: 90 | Fill #3

## 2015-05-06 ENCOUNTER — Encounter: Payer: Self-pay | Admitting: Internal Medicine

## 2015-05-07 ENCOUNTER — Other Ambulatory Visit: Payer: Self-pay | Admitting: Internal Medicine

## 2015-05-07 MED ORDER — ESZOPICLONE 3 MG PO TABS: 3.0000 mg | ORAL_TABLET | Freq: Every day | ORAL | Status: DC

## 2015-05-07 MED FILL — ESZOPICLONE 3 MG TABLET: 3 | 30 days supply | Qty: 30 | Fill #0

## 2015-05-07 NOTE — Telephone Encounter (Signed)
Ok as requested   Lunesta 3 mg, # 30, ref x 5, 1 at bedtime for sleep    To [place]

## 2015-05-07 NOTE — Addendum Note (Signed)
Addended by: Desmond Dike C on: 05/07/2015 03:20 PM   Modules accepted: Orders

## 2015-05-07 NOTE — Telephone Encounter (Signed)
CY please advise on refill. Thanks. 

## 2015-05-14 DIAGNOSIS — M109 Gout, unspecified: Secondary | ICD-10-CM | POA: Diagnosis not present

## 2015-05-14 DIAGNOSIS — Z0000 Encounter for general adult medical examination without abnormal findings: Secondary | ICD-10-CM | POA: Diagnosis not present

## 2015-05-14 DIAGNOSIS — Z125 Encounter for screening for malignant neoplasm of prostate: Secondary | ICD-10-CM | POA: Diagnosis not present

## 2015-05-14 DIAGNOSIS — E119 Type 2 diabetes mellitus without complications: Secondary | ICD-10-CM | POA: Diagnosis not present

## 2015-05-14 LAB — HEPATIC FUNCTION PANEL
ALK PHOS: 54 U/L (ref 25–125)
ALT: 46 U/L — AB (ref 10–40)
AST: 28 U/L (ref 14–40)
Bilirubin, Total: 0.6 mg/dL

## 2015-05-14 LAB — TSH: TSH: 2.15 u[IU]/mL (ref 0.41–5.90)

## 2015-05-14 LAB — HEMOGLOBIN A1C: Hemoglobin A1C: 7.3

## 2015-05-14 LAB — BASIC METABOLIC PANEL
BUN: 18 mg/dL (ref 4–21)
Creatinine: 1.1 mg/dL (ref 0.6–1.3)
GLUCOSE: 149 mg/dL
Potassium: 4.2 mmol/L (ref 3.4–5.3)
SODIUM: 140 mmol/L (ref 137–147)

## 2015-05-14 LAB — CBC AND DIFFERENTIAL
HEMATOCRIT: 42 % (ref 41–53)
HEMOGLOBIN: 13.7 g/dL (ref 13.5–17.5)
Platelets: 284 10*3/uL (ref 150–399)
WBC: 7.3 10*3/mL

## 2015-05-14 LAB — LIPID PANEL
Cholesterol: 123 mg/dL (ref 0–200)
HDL: 28 mg/dL — AB (ref 35–70)
LDL Cholesterol: 72 mg/dL
LDl/HDL Ratio: 2.6
Triglycerides: 114 mg/dL (ref 40–160)

## 2015-05-14 LAB — MICROALBUMIN, URINE

## 2015-05-14 LAB — PSA: PSA: 0.253

## 2015-05-20 LAB — HM DIABETES EYE EXAM

## 2015-05-25 DIAGNOSIS — E669 Obesity, unspecified: Secondary | ICD-10-CM | POA: Diagnosis not present

## 2015-05-25 DIAGNOSIS — I10 Essential (primary) hypertension: Secondary | ICD-10-CM | POA: Diagnosis not present

## 2015-05-25 DIAGNOSIS — E784 Other hyperlipidemia: Secondary | ICD-10-CM | POA: Diagnosis not present

## 2015-05-25 DIAGNOSIS — G4733 Obstructive sleep apnea (adult) (pediatric): Secondary | ICD-10-CM | POA: Diagnosis not present

## 2015-05-25 DIAGNOSIS — Z1389 Encounter for screening for other disorder: Secondary | ICD-10-CM | POA: Diagnosis not present

## 2015-05-25 DIAGNOSIS — E119 Type 2 diabetes mellitus without complications: Secondary | ICD-10-CM | POA: Diagnosis not present

## 2015-05-25 DIAGNOSIS — E298 Other testicular dysfunction: Secondary | ICD-10-CM | POA: Diagnosis not present

## 2015-05-25 DIAGNOSIS — N200 Calculus of kidney: Secondary | ICD-10-CM | POA: Diagnosis not present

## 2015-05-31 MED FILL — ALLOPURINOL 300 MG TABLET: 300 | 90 days supply | Qty: 90 | Fill #1

## 2015-05-31 MED FILL — ROSUVASTATIN CALCIUM 10 MG: 10 | 90 days supply | Qty: 45 | Fill #2

## 2015-05-31 MED FILL — LISINOPRIL 40 MG TABLET: 40 | 90 days supply | Qty: 90 | Fill #3

## 2015-05-31 MED FILL — metFORMIN HCL 1000 MG TABS: 1000 | 90 days supply | Qty: 180 | Fill #1

## 2015-06-04 DIAGNOSIS — Z1212 Encounter for screening for malignant neoplasm of rectum: Secondary | ICD-10-CM | POA: Diagnosis not present

## 2015-06-06 MED FILL — ESZOPICLONE 3 MG TABLET: 3 | 30 days supply | Qty: 30 | Fill #1

## 2015-06-12 MED FILL — AMLODIPINE BESYLATE 5 MG TA: 5 | 90 days supply | Qty: 90 | Fill #2

## 2015-06-27 DIAGNOSIS — G4733 Obstructive sleep apnea (adult) (pediatric): Secondary | ICD-10-CM | POA: Diagnosis not present

## 2015-07-06 MED FILL — ESZOPICLONE 3 MG TABLET: 3 | 30 days supply | Qty: 30 | Fill #2

## 2015-07-12 MED FILL — PANTOPRAZOLE SOD DR 40 MG T: 40 | 90 days supply | Qty: 90 | Fill #4

## 2015-07-12 MED FILL — TERAZOSIN 5 MG CAPSULE: 5 | 90 days supply | Qty: 90 | Fill #2

## 2015-08-06 MED FILL — ESZOPICLONE 3 MG TABLET: 3 | 30 days supply | Qty: 30 | Fill #3

## 2015-08-08 ENCOUNTER — Encounter: Payer: Self-pay | Admitting: Internal Medicine

## 2015-08-09 ENCOUNTER — Encounter: Payer: Self-pay | Admitting: Internal Medicine

## 2015-08-09 ENCOUNTER — Ambulatory Visit (INDEPENDENT_AMBULATORY_CARE_PROVIDER_SITE_OTHER): Payer: 59 | Admitting: Internal Medicine

## 2015-08-09 DIAGNOSIS — G4700 Insomnia, unspecified: Secondary | ICD-10-CM

## 2015-08-09 DIAGNOSIS — G4733 Obstructive sleep apnea (adult) (pediatric): Secondary | ICD-10-CM

## 2015-08-09 NOTE — Progress Notes (Signed)
Patient ID: Christopher Stewart, male    DOB: 18-Sep-1955, 60 y.o.   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 FOLLOWS FOR: DME is [facility]; wears CPAP 9/ [facility] every night for about 8 hours on avg. Weight down 302->298 He feels that he "needs" CPAP and won't sleep without it. No reported snoring through. Lunesta 3 mg when needed has worked well for insomnia  08/03/14- 60 yoM, never smoker , hospital -based dentist, followed for OSA with Insomnia FOLLOWS FOR: DME is [facility]-pt went this morning drop off card for download. Pt continues to wear CPAP 9/ [facility]. Download confirms excellent use/compliance and control at a pressure of 9. He is quite comfortable. Unfortunately he has not lost weight but otherwise he is doing extremely well. 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 Pt wears CPAP nightly; DL attached.  Machine working well now about 6 years. Download confirms pressure 9 with 100% 4 hour compliance and AHI 3.4/hour. He reports he "can't live without it"-his CPAP.  ROS-see HPI Constitutional:   No-   weight loss, night sweats, fevers, chills, fatigue, lassitude. HEENT:   No-  headaches, difficulty swallowing, tooth/dental problems, sore throat,       No-  sneezing, itching, ear ache, nasal congestion, post nasal drip,  CV:  No-   chest pain, orthopnea, PND, swelling in lower extremities, anasarca, dizziness, palpitations Resp: No-   shortness of breath with exertion or at rest.              No-   productive cough,  No non-productive cough,  No- coughing up of blood.              No-   change in color of mucus.  No- wheezing.   Skin: No-   rash or  lesions. GI:  No-   heartburn, indigestion, abdominal pain, nausea, vomiting,  GU:  MS:  No-   joint pain or swelling.   Neuro-     nothing unusual Psych:  No- change in mood or affect. No depression or anxiety.  No memory loss.  OBJ- Physical Exam General- Alert, Oriented, Affect-appropriate, Distress- none acute, + overweight Skin- rash-none, lesions- none, excoriation- none Lymphadenopathy- none Head- atraumatic            Eyes- Gross vision intact, PERRLA, conjunctivae and secretions clear            Ears- Hearing, canals-normal            Nose- Clear, no-Septal dev, mucus, polyps, erosion, perforation             Throat- Mallampati II-III , mucosa clear , drainage- none, tonsils- atrophic Neck- flexible , trachea midline, no stridor , thyroid nl, carotid no bruit Chest - symmetrical excursion , unlabored           Heart/CV- RRR , no murmur , no gallop  , no rub, nl s1 s2                           - JVD- none , edema- none, stasis changes- none, varices- none           Lung-  clear to P&A, wheeze- none, cough- none , dullness-none, rub- none           Chest wall-  Abd-  Br/ Gen/ Rectal- Not done, not indicated Extrem- cyanosis- none, clubbing, none, atrophy- none, strength- nl Neuro- grossly intact to observation

## 2015-08-09 NOTE — Patient Instructions (Signed)
We can continue CPAP 9, mask of choice, humidifier, supplies, AirView    Dx OSA  Please call as needed

## 2015-08-26 MED FILL — LISINOPRIL 40 MG TABLET: 40 | 90 days supply | Qty: 90 | Fill #4

## 2015-08-26 MED FILL — ROSUVASTATIN CALCIUM 10 MG: 10 | 90 days supply | Qty: 45 | Fill #3

## 2015-08-26 MED FILL — metFORMIN HCL 1000 MG TABS: 1000 | 90 days supply | Qty: 180 | Fill #2

## 2015-08-27 MED FILL — ALLOPURINOL 300 MG TABLET: 300 | 90 days supply | Qty: 90 | Fill #2

## 2015-09-06 MED FILL — ESZOPICLONE 3 MG TABLET: 3 | 30 days supply | Qty: 30 | Fill #4

## 2015-09-11 MED FILL — AMLODIPINE BESYLATE 5 MG TA: 5 | 90 days supply | Qty: 90 | Fill #0

## 2015-09-18 DIAGNOSIS — M546 Pain in thoracic spine: Secondary | ICD-10-CM | POA: Diagnosis not present

## 2015-10-02 NOTE — Assessment & Plan Note (Signed)
Lunesta 3 mg has remained effective Plan-continue emphasis on good sleep hygiene

## 2015-10-02 NOTE — Assessment & Plan Note (Signed)
Very successful with CPAP as documented by great download report this visit. He denies daytime sleepiness now and is told he is not snoring. We discussed pressure goals. Plan-continue present settings.

## 2015-10-08 MED FILL — ESZOPICLONE 3 MG TABLET: 3 | 30 days supply | Qty: 30 | Fill #5

## 2015-10-08 MED FILL — PANTOPRAZOLE SOD DR 40 MG T: 40 | 90 days supply | Qty: 90 | Fill #0

## 2015-10-10 DIAGNOSIS — M546 Pain in thoracic spine: Secondary | ICD-10-CM | POA: Diagnosis not present

## 2015-10-17 DIAGNOSIS — M546 Pain in thoracic spine: Secondary | ICD-10-CM | POA: Diagnosis not present

## 2015-10-17 MED FILL — FREESTYLE LANCETS: 25 days supply | Qty: 100 | Fill #0

## 2015-10-17 MED FILL — FREESTYLE LITE TEST STRIP: 75 days supply | Qty: 300 | Fill #0

## 2015-10-17 MED FILL — TERAZOSIN 5 MG CAPSULE: 5 | 90 days supply | Qty: 90 | Fill #3

## 2015-10-30 DIAGNOSIS — M546 Pain in thoracic spine: Secondary | ICD-10-CM | POA: Diagnosis not present

## 2015-11-02 ENCOUNTER — Other Ambulatory Visit: Payer: Self-pay | Admitting: Internal Medicine

## 2015-11-02 NOTE — Telephone Encounter (Signed)
Per CY: okay to fill Lunesta 3mg , 1 at bedtime if needed for sleep #30 with 5 refills [facility], spoke with pharmacist Darnelle Bos and verbal Rx was given Med list updated Will sign off

## 2015-11-02 NOTE — Telephone Encounter (Signed)
Last ov with CY on 08/09/15  Return in about 1 year (around 08/08/2016).  We can continue CPAP 9, mask of choice, humidifier, supplies, AirView    Dx OSA  Please call as needed   Received electronic refill request for  Eszoopiclone 3mg  Sig: Take 1 tablet (3 mg total) by mouth at bedtime. Take immediately before bedtime #30 with 5 refills Last refilled 05/07/15   CY please advise if okay to refill   Allergies  Allergen Reactions  . Horse-Derived Products Hives  . Other     Thimerosal     Current Outpatient Prescriptions:  .  allopurinol (ZYLOPRIM) 300 MG tablet, Take 300 mg by mouth daily.  , Disp: , Rfl:  .  amLODipine (NORVASC) 5 MG tablet, Take 5 mg by mouth daily.  , Disp: , Rfl:  .  aspirin 81 MG tablet, Take 81 mg by mouth every other day. , Disp: , Rfl:  .  Eszopiclone 3 MG TABS, Take 1 tablet (3 mg total) by mouth at bedtime. Take immediately before bedtime, Disp: 30 tablet, Rfl: 5 .  lisinopril (PRINIVIL,ZESTRIL) 40 MG tablet, Take 40 mg by mouth daily.  , Disp: , Rfl:  .  metFORMIN (GLUCOPHAGE) 1000 MG tablet, Take 1,000 mg by mouth 2 (two) times daily with a meal.  , Disp: , Rfl:  .  pantoprazole (PROTONIX) 40 MG tablet, Take 40 mg by mouth daily., Disp: , Rfl:  .  rosuvastatin (CRESTOR) 10 MG tablet, Take 10 mg by mouth every other day. , Disp: , Rfl:  .  terazosin (HYTRIN) 5 MG capsule, Take 5 mg by mouth daily., Disp: , Rfl:

## 2015-11-05 MED FILL — ESZOPICLONE 3 MG TABLET: 3 | 30 days supply | Qty: 30 | Fill #0

## 2015-11-13 ENCOUNTER — Telehealth: Payer: Self-pay | Admitting: *Deleted

## 2015-11-13 NOTE — Telephone Encounter (Signed)
Pt has been scheduled for Nov 6 @5pm ( New Patient) will this be okay. He switched appt with his wife, due to her not being able to keep her appt. Will this be okay ?

## 2015-11-13 NOTE — Telephone Encounter (Signed)
FYI

## 2015-11-19 ENCOUNTER — Ambulatory Visit (INDEPENDENT_AMBULATORY_CARE_PROVIDER_SITE_OTHER): Payer: 59 | Admitting: Internal Medicine

## 2015-11-19 ENCOUNTER — Encounter: Payer: Self-pay | Admitting: Internal Medicine

## 2015-11-19 VITALS — BP 154/88 | HR 87 | Temp 97.8°F | Resp 12 | Ht 70.0 in | Wt 296.0 lb

## 2015-11-19 DIAGNOSIS — E669 Obesity, unspecified: Secondary | ICD-10-CM

## 2015-11-19 DIAGNOSIS — E785 Hyperlipidemia, unspecified: Secondary | ICD-10-CM | POA: Diagnosis not present

## 2015-11-19 DIAGNOSIS — E1159 Type 2 diabetes mellitus with other circulatory complications: Secondary | ICD-10-CM

## 2015-11-19 DIAGNOSIS — E119 Type 2 diabetes mellitus without complications: Secondary | ICD-10-CM | POA: Diagnosis not present

## 2015-11-19 DIAGNOSIS — I10 Essential (primary) hypertension: Secondary | ICD-10-CM

## 2015-11-19 DIAGNOSIS — E1169 Type 2 diabetes mellitus with other specified complication: Secondary | ICD-10-CM

## 2015-11-19 NOTE — Patient Instructions (Addendum)
Good to see you!     Cell phone [phone]  Call if any problems

## 2015-11-19 NOTE — Progress Notes (Signed)
Pre-visit discussion using our clinic review tool. No additional management support is needed unless otherwise documented below in the visit note.  

## 2015-11-19 NOTE — Progress Notes (Signed)
20 every other   Subjective:  Patient ID: Christopher Stewart, male    DOB: [birth year]  Age: 60 y.o. MRN: [id number]  CC: The primary encounter diagnosis was Diabetes mellitus without complication ([place]). Diagnoses of Hyperlipidemia LDL goal <100, Obesity, diabetes, and hypertension syndrome ([place]), and Essential hypertension were also pertinent to this visit.  HPI Christopher Stewart presents for establishment of care. 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  He has a histor yof Type 2 DM complicated by hypertension, hyperlipidemia and obesity complicated by OSA managed with CPAP.     History of hypogonadism: treated  In the past with various modalities including buccal tabs,  Topical gels, all not tolerated by patient or wife . Has had a screening  DEXA  Which was reportedly normal.      :  Hypertension: patient checks blood pressure twice weekly at home.  Readings have been for the most part < 130/80 at rest . Patient is following a reduced salt diet most days and is taking medications as prescribed.  Terazosin was added /substituted for Flomax for additional control.    Type 2 DM; diagnosed 15 years ago .    Annual eye exams every 2 years by Dr Patsy Lager colleague Dr. Cena Benton .   Last a1c 7.3 in May.  Prior trial of Byetta made him nauseated.   Stress eater .  Walking every other day, limited by the inclination and ambulatory speed of his dachsund, and by his chronic back pain .  Marland Kitchen   Mid  thoracic Back pain since10/31/2105 after a 3 hour surgery.  Diagnosed with bone spurring at  T8 level with arthritis tried oral steroids,  Then NSAIDs.  Saw Ramos, injection helped transiently  for 2 week .  Uses aleve 400 mg total and tylenol twice daily  the day of and after surgery    colonoscopy at 17.  No polyps some tics  Done by Sadie Haber in [place].  Wants to switch to [employer] .  OSA managed by Pulmonary. Changing it routinely.   Hypertension  Hyperlipidemia  On Crestor   10  mg every other day ,  Alternates with aspirin.  Last panel  HDL 28 LDL 72 . Avoiding daily asa due to mild gastritis with prior daily use.  PSA normal May 2017 during annual preventive visit with Crist Infante     [name] has a past medical history of Arthritis; Diabetes mellitus, type 2 ([place]); Gout; Hyperlipemia; Hypertension; Insomnia (04/27/2010); Nephrolithiasis; Obstructive sleep apnea (04/27/2010); and OSA on CPAP.   He has a past surgical history that includes Surgery scrotal / testicular (N/A, 1966).   His family history includes Deep vein thrombosis in his father; Heart disease in his mother.He reports that he has never smoked. He has never used smokeless tobacco. He reports that he does not drink alcohol or use drugs.  Outpatient Medications Prior to Visit  Medication Sig Dispense Refill  . allopurinol (ZYLOPRIM) 300 MG tablet Take 300 mg by mouth daily.      Marland Kitchen amLODipine (NORVASC) 5 MG tablet Take 5 mg by mouth daily.      Marland Kitchen aspirin 81 MG tablet Take 81 mg by mouth every other day.     . Eszopiclone 3 MG TABS TAKE 1 TABLET BY MOUTH AT BEDTIME 30 tablet 5  . lisinopril (PRINIVIL,ZESTRIL) 40 MG tablet Take 40 mg by mouth daily.      . metFORMIN (GLUCOPHAGE) 1000 MG  tablet Take 1,000 mg by mouth 2 (two) times daily with a meal.      . pantoprazole (PROTONIX) 40 MG tablet Take 40 mg by mouth daily.    . rosuvastatin (CRESTOR) 10 MG tablet Take 10 mg by mouth every other day.     . terazosin (HYTRIN) 5 MG capsule Take 5 mg by mouth daily.     No facility-administered medications prior to visit.     Review of Systems:  Patient denies headache, fevers, malaise, unintentional weight loss, skin rash, eye pain, sinus congestion and sinus pain, sore throat, dysphagia,  hemoptysis , cough, dyspnea, wheezing, chest pain, palpitations, orthopnea, edema, abdominal pain, nausea, melena, diarrhea, constipation, flank pain, dysuria, hematuria, urinary  Frequency, nocturia, numbness,  tingling, seizures,  Focal weakness, Loss of consciousness,  Tremor, insomnia, depression, anxiety, and suicidal ideation.     Objective:  BP (!) 154/88   Pulse 87   Temp 97.8 F (36.6 C) (Oral)   Resp 12   Ht 5\' 10"  (1.778 m)   Wt 296 lb (134.3 kg)   SpO2 97%   BMI 42.47 kg/m   Physical Exam:   Assessment & Plan:   Problem List Items Addressed This Visit    Obesity, diabetes, and hypertension syndrome ([place])    Currently managed with metformin only,  Intolerant of Byetta.  Last A1c 7.3 in July 2016,  No proteinuria.  On lisinopril, d crestor and aspirin.  Lab Results  Component Value Date   HGBA1C 7.1 (H) 07/26/2014         Hyperlipidemia LDL goal <100    Managed with every other day crestor 10 mg .  Fasting labs ordered       Relevant Orders   LDL cholesterol, direct   Essential hypertension    he reports compliance with medication regimen  but has an elevated reading today in office.  He has been asked to check his BP at work and  submit readings for evaluation. Renal function will be checked today. Plan will be to change lisinopril to losartan if needed for goal BP 120/70        Other Visit Diagnoses    Diabetes mellitus without complication ([place])    -  Primary   Relevant Orders   Hemoglobin A1c   Comprehensive metabolic panel   Lipid panel      I am having Mr. Christopher Stewart maintain his lisinopril, allopurinol, metFORMIN, aspirin, amLODipine, rosuvastatin, pantoprazole, terazosin, Eszopiclone, acetaminophen, and naproxen sodium.  Meds ordered this encounter  Medications  . acetaminophen (TYLENOL) 500 MG tablet    Sig: Take 1,000 mg by mouth every 6 (six) hours as needed.  . naproxen sodium (ANAPROX) 220 MG tablet    Sig: Take 400 mg by mouth 2 (two) times daily with a meal.    There are no discontinued medications.  Follow-up: No Follow-up on file.   Crecencio Mc, MD

## 2015-11-20 ENCOUNTER — Encounter: Payer: Self-pay | Admitting: Internal Medicine

## 2015-11-20 DIAGNOSIS — E119 Type 2 diabetes mellitus without complications: Secondary | ICD-10-CM | POA: Insufficient documentation

## 2015-11-20 DIAGNOSIS — E669 Obesity, unspecified: Secondary | ICD-10-CM | POA: Insufficient documentation

## 2015-11-20 DIAGNOSIS — E785 Hyperlipidemia, unspecified: Secondary | ICD-10-CM | POA: Insufficient documentation

## 2015-11-20 DIAGNOSIS — E1159 Type 2 diabetes mellitus with other circulatory complications: Secondary | ICD-10-CM

## 2015-11-20 DIAGNOSIS — E1169 Type 2 diabetes mellitus with other specified complication: Secondary | ICD-10-CM

## 2015-11-20 DIAGNOSIS — I10 Essential (primary) hypertension: Secondary | ICD-10-CM | POA: Insufficient documentation

## 2015-11-20 LAB — HEMOGLOBIN A1C: Hgb A1c MFr Bld: 8 % — ABNORMAL HIGH (ref 4.6–6.5)

## 2015-11-20 LAB — LIPID PANEL
CHOLESTEROL: 132 mg/dL (ref 0–200)
HDL: 33 mg/dL — ABNORMAL LOW (ref >39.00)
LDL Cholesterol: 71 mg/dL (ref 0–99)
NonHDL: 98.51
Total CHOL/HDL Ratio: 4
Triglycerides: 137 mg/dL (ref 0.0–149.0)
VLDL: 27.4 mg/dL (ref 0.0–40.0)

## 2015-11-20 LAB — COMPREHENSIVE METABOLIC PANEL
ALT: 67 U/L — ABNORMAL HIGH (ref 0–53)
AST: 47 U/L — AB (ref 0–37)
Albumin: 4.1 g/dL (ref 3.5–5.2)
Alkaline Phosphatase: 56 U/L (ref 39–117)
BILIRUBIN TOTAL: 0.5 mg/dL (ref 0.2–1.2)
BUN: 24 mg/dL — ABNORMAL HIGH (ref 6–23)
CALCIUM: 10.1 mg/dL (ref 8.4–10.5)
CHLORIDE: 105 meq/L (ref 96–112)
CO2: 26 mEq/L (ref 19–32)
Creatinine, Ser: 1.29 mg/dL (ref 0.40–1.50)
GFR: 60.45 mL/min (ref >60.00)
Glucose, Bld: 152 mg/dL — ABNORMAL HIGH (ref 70–99)
Potassium: 4.2 mEq/L (ref 3.5–5.1)
Sodium: 141 mEq/L (ref 135–145)
Total Protein: 7.3 g/dL (ref 6.0–8.3)

## 2015-11-20 LAB — LDL CHOLESTEROL, DIRECT: LDL DIRECT: 87 mg/dL

## 2015-11-20 NOTE — Assessment & Plan Note (Signed)
Managed with every other day crestor 10 mg .  Fasting labs ordered

## 2015-11-20 NOTE — Assessment & Plan Note (Signed)
he reports compliance with medication regimen  but has an elevated reading today in office.  He has been asked to check his BP at work and  submit readings for evaluation. Renal function will be checked today. Plan will be to change lisinopril to losartan if needed for goal BP 120/70

## 2015-11-20 NOTE — Assessment & Plan Note (Signed)
Currently managed with metformin only,  Intolerant of Byetta.  Last A1c 7.3 in July 2016,  No proteinuria.  On lisinopril, d crestor and aspirin.  Lab Results  Component Value Date   HGBA1C 7.1 (H) 07/26/2014

## 2015-11-21 ENCOUNTER — Other Ambulatory Visit: Payer: Self-pay | Admitting: Internal Medicine

## 2015-11-21 MED ORDER — LISINOPRIL 40 MG PO TABS: 40.0000 mg | ORAL_TABLET | Freq: Every day | ORAL | 1 refills | Status: DC

## 2015-11-21 MED ORDER — EMPAGLIFLOZIN 10 MG PO TABS: 10.0000 mg | ORAL_TABLET | Freq: Every day | ORAL | 3 refills | Status: DC

## 2015-11-21 MED ORDER — ROSUVASTATIN CALCIUM 10 MG PO TABS: 10.0000 mg | ORAL_TABLET | ORAL | 1 refills | Status: DC

## 2015-11-21 MED ORDER — CELECOXIB 200 MG PO CAPS: 200.0000 mg | ORAL_CAPSULE | Freq: Two times a day (BID) | ORAL | 1 refills | Status: DC

## 2015-11-21 MED FILL — JARDIANCE 10 MG TABLET: 10 | 30 days supply | Qty: 30 | Fill #0

## 2015-11-21 MED FILL — metFORMIN HCL 1000 MG TABS: 1000 | 90 days supply | Qty: 180 | Fill #3

## 2015-11-21 MED FILL — CELECOXIB 200 MG CAPSULE: 200 | 90 days supply | Qty: 180 | Fill #0

## 2015-11-21 MED FILL — LISINOPRIL 40 MG TABLET: 40 | 90 days supply | Qty: 90 | Fill #0

## 2015-11-21 MED FILL — ALLOPURINOL 300 MG TABLET: 300 | 90 days supply | Qty: 90 | Fill #3

## 2015-11-21 MED FILL — ROSUVASTATIN CALCIUM 10 MG: 10 | 90 days supply | Qty: 45 | Fill #0

## 2015-11-23 ENCOUNTER — Ambulatory Visit: Payer: 59 | Admitting: Internal Medicine

## 2015-11-27 LAB — LIPID PANEL
CHOLESTEROL: 123 mg/dL (ref 0–200)
HDL: 28 mg/dL — AB (ref 35–70)
LDL Cholesterol: 72 mg/dL
LDl/HDL Ratio: 2.6
Triglycerides: 114 mg/dL (ref 40–160)

## 2015-11-27 LAB — BASIC METABOLIC PANEL
BUN: 18 mg/dL (ref 4–21)
Creatinine: 1.1 mg/dL (ref 0.6–1.3)
Glucose: 149 mg/dL
Sodium: 140 mmol/L (ref 137–147)

## 2015-11-27 LAB — TSH: TSH: 2.15 u[IU]/mL (ref 0.41–5.90)

## 2015-11-27 LAB — PSA: PSA: 0.253

## 2015-11-27 LAB — CBC AND DIFFERENTIAL
HEMATOCRIT: 42 % (ref 41–53)
HEMOGLOBIN: 13.7 g/dL (ref 13.5–17.5)
Platelets: 284 10*3/uL (ref 150–399)
WBC: 7.3 10^3/mL

## 2015-11-27 LAB — HEPATIC FUNCTION PANEL
ALK PHOS: 54 U/L (ref 25–125)
ALT: 46 U/L — AB (ref 10–40)
AST: 28 U/L (ref 14–40)
Bilirubin, Total: 0.6 mg/dL

## 2015-11-27 LAB — HEMOGLOBIN A1C: Hemoglobin A1C: 7.3

## 2015-12-04 MED FILL — ESZOPICLONE 3 MG TABLET: 3 | 30 days supply | Qty: 30 | Fill #1

## 2015-12-04 MED FILL — AMLODIPINE BESYLATE 5 MG TA: 5 | 90 days supply | Qty: 90 | Fill #1

## 2015-12-12 ENCOUNTER — Telehealth: Payer: Self-pay | Admitting: Internal Medicine

## 2015-12-12 DIAGNOSIS — E1159 Type 2 diabetes mellitus with other circulatory complications: Secondary | ICD-10-CM

## 2015-12-12 DIAGNOSIS — E785 Hyperlipidemia, unspecified: Secondary | ICD-10-CM

## 2015-12-12 DIAGNOSIS — E1169 Type 2 diabetes mellitus with other specified complication: Secondary | ICD-10-CM

## 2015-12-12 DIAGNOSIS — E669 Obesity, unspecified: Secondary | ICD-10-CM

## 2015-12-12 DIAGNOSIS — I10 Essential (primary) hypertension: Secondary | ICD-10-CM

## 2015-12-12 NOTE — Telephone Encounter (Signed)
Yes, labs and UA for protein have been ordered

## 2015-12-12 NOTE — Telephone Encounter (Signed)
I have pended the some orders please advise if this is what you want.

## 2015-12-12 NOTE — Telephone Encounter (Signed)
Pt called and was wondering if you would like him to have an A1C and CMET before appt on 2/9 at 3:30. Please advise, thank you!  Call pt @ [phone]

## 2015-12-13 NOTE — Telephone Encounter (Signed)
Pt is going to have done at the hospital.

## 2015-12-13 NOTE — Telephone Encounter (Signed)
Yes please  Schedule lab.

## 2015-12-17 MED FILL — JARDIANCE 10 MG TABLET: 10 | 30 days supply | Qty: 30 | Fill #1

## 2015-12-31 MED FILL — ESZOPICLONE 3 MG TABLET: 3 | 30 days supply | Qty: 30 | Fill #2

## 2015-12-31 MED FILL — PANTOPRAZOLE SOD DR 40 MG T: 40 | 90 days supply | Qty: 90 | Fill #1

## 2016-01-03 ENCOUNTER — Encounter: Payer: Self-pay | Admitting: Internal Medicine

## 2016-01-05 DIAGNOSIS — G4733 Obstructive sleep apnea (adult) (pediatric): Secondary | ICD-10-CM | POA: Diagnosis not present

## 2016-01-15 MED FILL — predniSONE 5 MG (21) TBPK: 5 | 6 days supply | Qty: 21 | Fill #2

## 2016-01-18 MED FILL — TERAZOSIN 5 MG CAPSULE: 5 | 90 days supply | Qty: 90 | Fill #0

## 2016-01-18 MED FILL — JARDIANCE 10 MG TABLET: 10 | 30 days supply | Qty: 30 | Fill #2

## 2016-01-28 MED FILL — ESZOPICLONE 3 MG TABLET: 3 | 30 days supply | Qty: 30 | Fill #3

## 2016-01-28 MED FILL — FREESTYLE LANCETS: 25 days supply | Qty: 100 | Fill #1

## 2016-02-12 ENCOUNTER — Telehealth: Payer: Self-pay | Admitting: Internal Medicine

## 2016-02-12 ENCOUNTER — Encounter: Payer: Self-pay | Admitting: Internal Medicine

## 2016-02-12 MED ORDER — METFORMIN HCL 1000 MG PO TABS: 1000.0000 mg | ORAL_TABLET | Freq: Two times a day (BID) | ORAL | 0 refills | Status: DC

## 2016-02-12 MED ORDER — ALLOPURINOL 300 MG PO TABS: 300.0000 mg | ORAL_TABLET | Freq: Every day | ORAL | 0 refills | Status: DC

## 2016-02-12 MED FILL — metFORMIN HCL 1000 MG TABS: 1000 | 30 days supply | Qty: 60 | Fill #0

## 2016-02-12 MED FILL — JARDIANCE 10 MG TABLET: 10 | 30 days supply | Qty: 30 | Fill #3

## 2016-02-12 MED FILL — LISINOPRIL 40 MG TABLET: 40 | 90 days supply | Qty: 90 | Fill #1

## 2016-02-12 MED FILL — ALLOPURINOL 300 MG TABLET: 300 | 30 days supply | Qty: 30 | Fill #0

## 2016-02-12 MED FILL — CELECOXIB 200 MG CAP: 200 | 90 days supply | Qty: 180 | Fill #1

## 2016-02-12 NOTE — Telephone Encounter (Signed)
allopurinol (ZYLOPRIM) 300 MG tablet take one by mouth daily. Qty 90 refills 3.

## 2016-02-12 NOTE — Telephone Encounter (Signed)
metFORMIN (GLUCOPHAGE) 1000 MG tablet take one tablet by mouth twice daily qty 180 refills 3.

## 2016-02-12 NOTE — Addendum Note (Signed)
Addended by: Valere Dross on: 02/12/2016 02:57 PM   Modules accepted: Orders

## 2016-02-13 MED ORDER — ALLOPURINOL 300 MG PO TABS: 300.0000 mg | ORAL_TABLET | Freq: Every day | ORAL | 1 refills | Status: DC

## 2016-02-13 MED ORDER — METFORMIN HCL 1000 MG PO TABS: 1000.0000 mg | ORAL_TABLET | Freq: Two times a day (BID) | ORAL | 1 refills | Status: DC

## 2016-02-15 ENCOUNTER — Other Ambulatory Visit (INDEPENDENT_AMBULATORY_CARE_PROVIDER_SITE_OTHER): Payer: 59

## 2016-02-15 DIAGNOSIS — E785 Hyperlipidemia, unspecified: Secondary | ICD-10-CM | POA: Diagnosis not present

## 2016-02-15 DIAGNOSIS — I10 Essential (primary) hypertension: Secondary | ICD-10-CM | POA: Diagnosis not present

## 2016-02-15 DIAGNOSIS — E1169 Type 2 diabetes mellitus with other specified complication: Principal | ICD-10-CM

## 2016-02-15 DIAGNOSIS — E1159 Type 2 diabetes mellitus with other circulatory complications: Principal | ICD-10-CM

## 2016-02-15 DIAGNOSIS — E119 Type 2 diabetes mellitus without complications: Secondary | ICD-10-CM

## 2016-02-15 DIAGNOSIS — E669 Obesity, unspecified: Secondary | ICD-10-CM | POA: Diagnosis not present

## 2016-02-15 LAB — COMPREHENSIVE METABOLIC PANEL
ALT: 49 U/L (ref 0–53)
AST: 30 U/L (ref 0–37)
Albumin: 3.9 g/dL (ref 3.5–5.2)
Alkaline Phosphatase: 60 U/L (ref 39–117)
BUN: 21 mg/dL (ref 6–23)
CO2: 29 mEq/L (ref 19–32)
CREATININE: 1.09 mg/dL (ref 0.40–1.50)
Calcium: 9.8 mg/dL (ref 8.4–10.5)
Chloride: 108 mEq/L (ref 96–112)
GFR: 73.37 mL/min (ref >60.00)
GLUCOSE: 140 mg/dL — AB (ref 70–99)
Potassium: 4.4 mEq/L (ref 3.5–5.1)
Sodium: 143 mEq/L (ref 135–145)
Total Bilirubin: 0.3 mg/dL (ref 0.2–1.2)
Total Protein: 7 g/dL (ref 6.0–8.3)

## 2016-02-15 LAB — LIPID PANEL
CHOL/HDL RATIO: 4
Cholesterol: 117 mg/dL (ref 0–200)
HDL: 28.1 mg/dL — ABNORMAL LOW (ref >39.00)
LDL CALC: 71 mg/dL (ref 0–99)
NONHDL: 88.54
Triglycerides: 86 mg/dL (ref 0.0–149.0)
VLDL: 17.2 mg/dL (ref 0.0–40.0)

## 2016-02-15 LAB — MICROALBUMIN / CREATININE URINE RATIO
CREATININE, U: 102.6 mg/dL
Microalb Creat Ratio: 1 mg/g (ref 0.0–30.0)
Microalb, Ur: 1 mg/dL (ref 0.0–1.9)

## 2016-02-15 LAB — HEMOGLOBIN A1C: Hgb A1c MFr Bld: 7.3 % — ABNORMAL HIGH (ref 4.6–6.5)

## 2016-02-18 ENCOUNTER — Other Ambulatory Visit: Payer: Self-pay | Admitting: Internal Medicine

## 2016-02-18 ENCOUNTER — Other Ambulatory Visit: Payer: 59

## 2016-02-22 ENCOUNTER — Encounter: Payer: Self-pay | Admitting: Internal Medicine

## 2016-02-22 ENCOUNTER — Ambulatory Visit (INDEPENDENT_AMBULATORY_CARE_PROVIDER_SITE_OTHER): Payer: 59 | Admitting: Internal Medicine

## 2016-02-22 VITALS — BP 116/72 | HR 84 | Resp 16 | Wt 294.0 lb

## 2016-02-22 DIAGNOSIS — S39012S Strain of muscle, fascia and tendon of lower back, sequela: Secondary | ICD-10-CM | POA: Diagnosis not present

## 2016-02-22 DIAGNOSIS — I10 Essential (primary) hypertension: Secondary | ICD-10-CM

## 2016-02-22 DIAGNOSIS — E669 Obesity, unspecified: Secondary | ICD-10-CM

## 2016-02-22 DIAGNOSIS — E785 Hyperlipidemia, unspecified: Secondary | ICD-10-CM

## 2016-02-22 DIAGNOSIS — E1169 Type 2 diabetes mellitus with other specified complication: Principal | ICD-10-CM

## 2016-02-22 DIAGNOSIS — E119 Type 2 diabetes mellitus without complications: Secondary | ICD-10-CM | POA: Diagnosis not present

## 2016-02-22 DIAGNOSIS — E1159 Type 2 diabetes mellitus with other circulatory complications: Principal | ICD-10-CM

## 2016-02-22 MED ORDER — LISINOPRIL 40 MG PO TABS: 40.0000 mg | ORAL_TABLET | Freq: Every day | ORAL | 1 refills | Status: DC

## 2016-02-22 MED ORDER — CELECOXIB 200 MG PO CAPS: 200.0000 mg | ORAL_CAPSULE | Freq: Two times a day (BID) | ORAL | 1 refills | Status: DC

## 2016-02-22 MED ORDER — EMPAGLIFLOZIN 10 MG PO TABS: 10.0000 mg | ORAL_TABLET | Freq: Every day | ORAL | 3 refills | Status: DC

## 2016-02-22 MED ORDER — PANTOPRAZOLE SODIUM 40 MG PO TBEC: 40.0000 mg | DELAYED_RELEASE_TABLET | Freq: Every day | ORAL | 0 refills | Status: DC

## 2016-02-22 NOTE — Progress Notes (Signed)
Subjective:  Patient ID: Christopher Stewart, male    DOB: [birth year]  Age: 61 y.o. MRN: [id number]  CC: The primary encounter diagnosis was Obesity, diabetes, and hypertension syndrome ([place]). Diagnoses of Essential hypertension, Hyperlipidemia LDL goal <100, and Low back strain, sequela were also pertinent to this visit.  HPI [name] presents for follow up on type 2 DM obesity, hypertension and back pain.    He feels generally well, is not exercising due ot recent flare of back pain , but is checking blood sugars once daily at variable times.  BS have been under 130 fasting and < 150 post prandially.  Denies any recent hypoglyemic events.  Taking his medications as directed. Following a carbohydrate modified diet 6 days per week. Denies numbness, burning and tingling of extremities. Appetite is good.     Weight down 2 lbs.   bp fine    Outpatient Medications Prior to Visit  Medication Sig Dispense Refill  . allopurinol (ZYLOPRIM) 300 MG tablet Take 1 tablet (300 mg total) by mouth daily. 90 tablet 1  . amLODipine (NORVASC) 5 MG tablet Take 5 mg by mouth daily.      Marland Kitchen aspirin 81 MG tablet Take 81 mg by mouth every other day.     . Eszopiclone 3 MG TABS TAKE 1 TABLET BY MOUTH AT BEDTIME 30 tablet 5  . metFORMIN (GLUCOPHAGE) 1000 MG tablet Take 1 tablet (1,000 mg total) by mouth 2 (two) times daily with a meal. 180 tablet 1  . rosuvastatin (CRESTOR) 10 MG tablet Take 1 tablet (10 mg total) by mouth every other day. 45 tablet 1  . terazosin (HYTRIN) 5 MG capsule Take 5 mg by mouth daily.    . celecoxib (CELEBREX) 200 MG capsule Take 1 capsule (200 mg total) by mouth 2 (two) times daily. 180 capsule 1  . JARDIANCE 10 MG TABS tablet TAKE 1 TABLET BY MOUTH DAILY. 30 tablet 3  . lisinopril (PRINIVIL,ZESTRIL) 40 MG tablet Take 1 tablet (40 mg total) by mouth daily. 90 tablet 1  . pantoprazole (PROTONIX) 40 MG tablet Take 40 mg by mouth daily.    Marland Kitchen acetaminophen (TYLENOL) 500 MG tablet  Take 1,000 mg by mouth every 6 (six) hours as needed.    . naproxen sodium (ANAPROX) 220 MG tablet Take 400 mg by mouth 2 (two) times daily with a meal.     No facility-administered medications prior to visit.     Review of Systems;  Patient denies headache, fevers, malaise, unintentional weight loss, skin rash, eye pain, sinus congestion and sinus pain, sore throat, dysphagia,  hemoptysis , cough, dyspnea, wheezing, chest pain, palpitations, orthopnea, edema, abdominal pain, nausea, melena, diarrhea, constipation, flank pain, dysuria, hematuria, urinary  Frequency, nocturia, numbness, tingling, seizures,  Focal weakness, Loss of consciousness,  Tremor, insomnia, depression, anxiety, and suicidal ideation.      Objective:  BP 116/72   Pulse 84   Resp 16   Wt 294 lb (133.4 kg)   SpO2 96%   BMI 42.18 kg/m   BP Readings from Last 3 Encounters:  02/22/16 116/72  11/19/15 (!) 154/88  08/09/15 126/80    Wt Readings from Last 3 Encounters:  02/22/16 294 lb (133.4 kg)  11/19/15 296 lb (134.3 kg)  08/09/15 297 lb 12.8 oz (135.1 kg)    General appearance: alert, cooperative and appears stated age Ears: normal TM's and external ear canals both ears Throat: lips, mucosa, and tongue normal; teeth and gums normal Neck:  no adenopathy, no carotid bruit, supple, symmetrical, trachea midline and thyroid not enlarged, symmetric, no tenderness/mass/nodules Back: symmetric, no curvature. ROM normal. No CVA tenderness. Lungs: clear to auscultation bilaterally Heart: regular rate and rhythm, S1, S2 normal, no murmur, click, rub or gallop Abdomen: soft, non-tender; bowel sounds normal; no masses,  no organomegaly Pulses: 2+ and symmetric Skin: Skin color, texture, turgor normal. No rashes or lesions Lymph nodes: Cervical, supraclavicular, and axillary nodes normal.  Lab Results  Component Value Date   HGBA1C 7.3 (H) 02/15/2016   HGBA1C 7.3 11/27/2015   HGBA1C 8.0 (H) 11/19/2015    Lab  Results  Component Value Date   CREATININE 1.09 02/15/2016   CREATININE 1.1 11/27/2015   CREATININE 1.29 11/19/2015    Lab Results  Component Value Date   WBC 7.3 11/27/2015   HGB 13.7 11/27/2015   HCT 42 11/27/2015   PLT 284 11/27/2015   GLUCOSE 140 (H) 02/15/2016   CHOL 117 02/15/2016   TRIG 86.0 02/15/2016   HDL 28.10 (L) 02/15/2016   LDLDIRECT 87.0 11/19/2015   LDLCALC 71 02/15/2016   ALT 49 02/15/2016   AST 30 02/15/2016   NA 143 02/15/2016   K 4.4 02/15/2016   CL 108 02/15/2016   CREATININE 1.09 02/15/2016   BUN 21 02/15/2016   CO2 29 02/15/2016   TSH 2.15 11/27/2015   PSA 0.253 11/27/2015   HGBA1C 7.3 (H) 02/15/2016   MICROALBUR 1.0 02/15/2016    No results found.  Assessment & Plan:   Problem List Items Addressed This Visit    Essential hypertension    Well controlled on current regimen. Renal function stable, no changes today.  Lab Results  Component Value Date   CREATININE 1.09 02/15/2016   Lab Results  Component Value Date   NA 143 02/15/2016   K 4.4 02/15/2016   CL 108 02/15/2016   CO2 29 02/15/2016         Relevant Medications   lisinopril (PRINIVIL,ZESTRIL) 40 MG tablet   Hyperlipidemia LDL goal <100    Managed with every other day crestor 10 mg .  No changes today  Lab Results  Component Value Date   CHOL 117 02/15/2016   HDL 28.10 (L) 02/15/2016   LDLCALC 71 02/15/2016   LDLDIRECT 87.0 11/19/2015   TRIG 86.0 02/15/2016   CHOLHDL 4 02/15/2016   Lab Results  Component Value Date   ALT 49 02/15/2016   AST 30 02/15/2016   ALKPHOS 60 02/15/2016   BILITOT 0.3 02/15/2016         Relevant Medications   lisinopril (PRINIVIL,ZESTRIL) 40 MG tablet   Low back strain, sequela    Managed with celebrex once daily ,  Prednisone for acute flares.      Obesity, diabetes, and hypertension syndrome ([place]) - Primary    Previously  managed with metformin only,  Intolerant of Byetta.  Jardiance added for A1c 7.1 in Nov 2017  No proteinuria.   On lisinopril,  crestor and aspirin.  Lab Results  Component Value Date   HGBA1C 7.3 (H) 02/15/2016         Relevant Medications   lisinopril (PRINIVIL,ZESTRIL) 40 MG tablet   empagliflozin (JARDIANCE) 10 MG TABS tablet   Other Relevant Orders   Comp Met (CMET)   Hemoglobin A1c      I have changed Mr. Wessler JARDIANCE to empagliflozin. I have also changed his pantoprazole. I am also having him maintain his aspirin, amLODipine, terazosin, Eszopiclone, acetaminophen, naproxen sodium, rosuvastatin, metFORMIN,  allopurinol, celecoxib, and lisinopril.  Meds ordered this encounter  Medications  . celecoxib (CELEBREX) 200 MG capsule    Sig: Take 1 capsule (200 mg total) by mouth 2 (two) times daily.    Dispense:  180 capsule    Refill:  1  . lisinopril (PRINIVIL,ZESTRIL) 40 MG tablet    Sig: Take 1 tablet (40 mg total) by mouth daily.    Dispense:  90 tablet    Refill:  1  . empagliflozin (JARDIANCE) 10 MG TABS tablet    Sig: Take 10 mg by mouth daily.    Dispense:  90 tablet    Refill:  3  . pantoprazole (PROTONIX) 40 MG tablet    Sig: Take 1 tablet (40 mg total) by mouth daily.    Dispense:  90 tablet    Refill:  0    DO NOT FILL; KEEP ON FILE FOR NEXT REFILL    Medications Discontinued During This Encounter  Medication Reason  . celecoxib (CELEBREX) 200 MG capsule Reorder  . lisinopril (PRINIVIL,ZESTRIL) 40 MG tablet Reorder  . JARDIANCE 10 MG TABS tablet Reorder  . pantoprazole (PROTONIX) 40 MG tablet Reorder    Follow-up: No Follow-up on file.   Crecencio Mc, MD

## 2016-02-22 NOTE — Progress Notes (Signed)
Pre visit review using our clinic review tool, if applicable. No additional management support is needed unless otherwise documented below in the visit note. 

## 2016-02-24 DIAGNOSIS — S39012S Strain of muscle, fascia and tendon of lower back, sequela: Secondary | ICD-10-CM | POA: Insufficient documentation

## 2016-02-24 NOTE — Assessment & Plan Note (Signed)
Managed with celebrex once daily ,  Prednisone for acute flares.

## 2016-02-24 NOTE — Assessment & Plan Note (Signed)
Previously  managed with metformin only,  Intolerant of Byetta.  Jardiance added for A1c 7.1 in Nov 2017  No proteinuria.  On lisinopril,  crestor and aspirin.  Lab Results  Component Value Date   HGBA1C 7.3 (H) 02/15/2016

## 2016-02-24 NOTE — Assessment & Plan Note (Signed)
Well controlled on current regimen. Renal function stable, no changes today.  Lab Results  Component Value Date   CREATININE 1.09 02/15/2016   Lab Results  Component Value Date   NA 143 02/15/2016   K 4.4 02/15/2016   CL 108 02/15/2016   CO2 29 02/15/2016

## 2016-02-24 NOTE — Assessment & Plan Note (Signed)
Managed with every other day crestor 10 mg .  No changes today  Lab Results  Component Value Date   CHOL 117 02/15/2016   HDL 28.10 (L) 02/15/2016   LDLCALC 71 02/15/2016   LDLDIRECT 87.0 11/19/2015   TRIG 86.0 02/15/2016   CHOLHDL 4 02/15/2016   Lab Results  Component Value Date   ALT 49 02/15/2016   AST 30 02/15/2016   ALKPHOS 60 02/15/2016   BILITOT 0.3 02/15/2016

## 2016-03-11 MED FILL — ESZOPICLONE 3 MG TABLET: 3 | 30 days supply | Qty: 30 | Fill #4

## 2016-03-11 MED FILL — ROSUVASTATIN CALCIUM 10 MG: 10 | 90 days supply | Qty: 45 | Fill #1

## 2016-03-12 ENCOUNTER — Other Ambulatory Visit: Payer: Self-pay | Admitting: Internal Medicine

## 2016-03-12 MED FILL — AMLODIPINE BESYLATE 5 MG TA: 5 | 90 days supply | Qty: 90 | Fill #2

## 2016-03-12 MED FILL — JARDIANCE 10 MG TABLET: 10 | 30 days supply | Qty: 30 | Fill #0

## 2016-03-27 MED FILL — ALLOPURINOL 300 MG TAB: 300 | 90 days supply | Qty: 90 | Fill #0

## 2016-04-09 MED FILL — JARDIANCE 10 MG TABLET: 10 | 30 days supply | Qty: 30 | Fill #1

## 2016-04-09 MED FILL — ESZOPICLONE 3 MG TABLET: 3 | 30 days supply | Qty: 30 | Fill #5

## 2016-04-14 MED FILL — PANTOPRAZOLE SOD DR 40 MG T: 40 | 90 days supply | Qty: 90 | Fill #2

## 2016-04-14 MED FILL — metFORMIN HCL 1000 MG TABS: 1000 | 90 days supply | Qty: 180 | Fill #0

## 2016-04-21 MED FILL — TERAZOSIN 5 MG CAPSULE: 5 | 90 days supply | Qty: 90 | Fill #0

## 2016-05-09 MED FILL — CELECOXIB 200 MG CAP: 200 | 90 days supply | Qty: 180 | Fill #0

## 2016-05-09 MED FILL — JARDIANCE 10 MG TABLET: 10 | 30 days supply | Qty: 30 | Fill #0

## 2016-05-12 ENCOUNTER — Other Ambulatory Visit: Payer: Self-pay | Admitting: Internal Medicine

## 2016-05-12 NOTE — Telephone Encounter (Signed)
CY Please advise on refill. Thanks.  

## 2016-05-12 NOTE — Telephone Encounter (Signed)
Ok to refill x 6 months 

## 2016-05-13 MED FILL — ESZOPICLONE 3 MG TABLET: 3 | 30 days supply | Qty: 30 | Fill #0

## 2016-06-16 MED FILL — LISINOPRIL 40 MG TABLET: 40 | 90 days supply | Qty: 90 | Fill #0

## 2016-06-16 MED FILL — JARDIANCE 10 MG TABLET: 10 | 30 days supply | Qty: 30 | Fill #1

## 2016-06-17 ENCOUNTER — Other Ambulatory Visit: Payer: Self-pay

## 2016-06-17 MED ORDER — AMLODIPINE BESYLATE 5 MG PO TABS: 5.0000 mg | ORAL_TABLET | Freq: Every day | ORAL | 2 refills | Status: DC

## 2016-06-17 MED FILL — AMLODIPINE BESYLATE 5 MG TA: 5 | 90 days supply | Qty: 90 | Fill #0

## 2016-06-23 MED FILL — ALLOPURINOL 300 MG TAB: 300 | 90 days supply | Qty: 90 | Fill #1

## 2016-07-02 MED FILL — ESZOPICLONE 3 MG TABLET: 3 | 30 days supply | Qty: 30 | Fill #1

## 2016-07-07 ENCOUNTER — Encounter: Payer: Self-pay | Admitting: Internal Medicine

## 2016-07-07 ENCOUNTER — Other Ambulatory Visit (INDEPENDENT_AMBULATORY_CARE_PROVIDER_SITE_OTHER): Payer: 59

## 2016-07-07 DIAGNOSIS — E1159 Type 2 diabetes mellitus with other circulatory complications: Principal | ICD-10-CM

## 2016-07-07 DIAGNOSIS — I10 Essential (primary) hypertension: Secondary | ICD-10-CM | POA: Diagnosis not present

## 2016-07-07 DIAGNOSIS — E669 Obesity, unspecified: Secondary | ICD-10-CM

## 2016-07-07 DIAGNOSIS — E119 Type 2 diabetes mellitus without complications: Secondary | ICD-10-CM

## 2016-07-07 DIAGNOSIS — E1169 Type 2 diabetes mellitus with other specified complication: Principal | ICD-10-CM

## 2016-07-07 LAB — COMPREHENSIVE METABOLIC PANEL
ALT: 45 U/L (ref 0–53)
AST: 32 U/L (ref 0–37)
Albumin: 3.9 g/dL (ref 3.5–5.2)
Alkaline Phosphatase: 55 U/L (ref 39–117)
BUN: 16 mg/dL (ref 6–23)
CALCIUM: 9.7 mg/dL (ref 8.4–10.5)
CHLORIDE: 106 meq/L (ref 96–112)
CO2: 26 meq/L (ref 19–32)
Creatinine, Ser: 1.15 mg/dL (ref 0.40–1.50)
GFR: 68.88 mL/min (ref >60.00)
Glucose, Bld: 236 mg/dL — ABNORMAL HIGH (ref 70–99)
Potassium: 4.4 mEq/L (ref 3.5–5.1)
Sodium: 142 mEq/L (ref 135–145)
Total Bilirubin: 0.4 mg/dL (ref 0.2–1.2)
Total Protein: 6.7 g/dL (ref 6.0–8.3)

## 2016-07-07 LAB — HEMOGLOBIN A1C: Hgb A1c MFr Bld: 8 % — ABNORMAL HIGH (ref 4.6–6.5)

## 2016-07-08 MED ORDER — ROSUVASTATIN CALCIUM 10 MG PO TABS: 10.0000 mg | ORAL_TABLET | ORAL | 1 refills | Status: DC

## 2016-07-08 MED FILL — ROSUVASTATIN CALCIUM 10 MG: 10 | 90 days supply | Qty: 45 | Fill #0

## 2016-07-08 MED FILL — metFORMIN HCL 1000 MG TABS: 1000 | 90 days supply | Qty: 180 | Fill #1

## 2016-07-08 MED FILL — PANTOPRAZOLE SOD DR 40 MG T: 40 | 90 days supply | Qty: 90 | Fill #3

## 2016-07-08 NOTE — Telephone Encounter (Signed)
Rx sent 

## 2016-07-09 ENCOUNTER — Encounter: Payer: Self-pay | Admitting: Internal Medicine

## 2016-07-11 ENCOUNTER — Other Ambulatory Visit: Payer: Self-pay | Admitting: Internal Medicine

## 2016-07-11 MED ORDER — EMPAGLIFLOZIN 25 MG PO TABS: 25.0000 mg | ORAL_TABLET | Freq: Every day | ORAL | 5 refills | Status: DC

## 2016-07-14 MED FILL — TERAZOSIN 5 MG CAPSULE: 5 | 90 days supply | Qty: 90 | Fill #1

## 2016-07-14 MED FILL — JARDIANCE 25 MG TABLET: 25 | 30 days supply | Qty: 30 | Fill #0

## 2016-07-22 DIAGNOSIS — G4733 Obstructive sleep apnea (adult) (pediatric): Secondary | ICD-10-CM | POA: Diagnosis not present

## 2016-07-24 MED FILL — FREESTYLE LANCETS: 25 days supply | Qty: 100 | Fill #2

## 2016-07-30 ENCOUNTER — Encounter: Payer: Self-pay | Admitting: Internal Medicine

## 2016-07-30 ENCOUNTER — Telehealth: Payer: Self-pay | Admitting: Internal Medicine

## 2016-07-30 DIAGNOSIS — G4733 Obstructive sleep apnea (adult) (pediatric): Secondary | ICD-10-CM

## 2016-07-30 NOTE — Telephone Encounter (Signed)
CY, please see below copied email.  Ok to order new cpap?  If so, please specify settings.  Thanks!   Dr. Annamaria Boots:  My ResMed S-9 CPAP unit motor is dead. I will need to upgrade to a new unit per Nevin Bloodgood (respiratory specialist).  Can you send a prescription to [facility] in [place] for a new unit.  The Rx needs to specify the ResMed S-10 unit with autoset features.  I will be seeing you next week for follow up.  Nevin Bloodgood indicated she downloaded the results from my sim card yesterday if you need to request recent CPAP information.  Christopher Stewart

## 2016-07-30 NOTE — Telephone Encounter (Signed)
Christopher Stewart w/ [facility] states pt will need OV with note stating benefit and usage. After OV order for new machine can be processed.  Pt last seen 08/09/15. Pending OV 08/07/16 Per Katie schedule pt with CY at 11:30 on 08/04/16. ATC- received busy dial.

## 2016-07-30 NOTE — Telephone Encounter (Signed)
Please order DME [facility]- replacement for old broken CPAP machine:   Request ResMed S-10, auto 5-20, mask of choice, humidifier, supplies, AirView,    Dx OSA

## 2016-07-30 NOTE — Addendum Note (Signed)
Addended by: Len Blalock on: 07/30/2016 11:44 AM   Modules accepted: Orders

## 2016-07-31 NOTE — Telephone Encounter (Signed)
Pt is scheduled to see CY on 7/26 at 3

## 2016-08-06 ENCOUNTER — Telehealth: Payer: Self-pay | Admitting: Internal Medicine

## 2016-08-06 MED FILL — CELECOXIB 200 MG CAPSULE: 200 | 90 days supply | Qty: 180 | Fill #1

## 2016-08-06 MED FILL — ESZOPICLONE 3 MG TABLET: 3 | 30 days supply | Qty: 30 | Fill #2

## 2016-08-06 NOTE — Telephone Encounter (Signed)
Pt was contacted and voicemail was left for patient to bring CPAP machine with the cord to appointment on 08/07/16 with CY. Nothing further is needed.

## 2016-08-06 NOTE — Telephone Encounter (Signed)
Spoke with the pt  He states that [facility] is supposed to be sending DL off of his CPAP  Please advise once you have reviewed, thanks

## 2016-08-07 ENCOUNTER — Encounter: Payer: Self-pay | Admitting: Internal Medicine

## 2016-08-07 ENCOUNTER — Ambulatory Visit (INDEPENDENT_AMBULATORY_CARE_PROVIDER_SITE_OTHER): Payer: 59 | Admitting: Internal Medicine

## 2016-08-07 DIAGNOSIS — I10 Essential (primary) hypertension: Secondary | ICD-10-CM | POA: Diagnosis not present

## 2016-08-07 DIAGNOSIS — G4733 Obstructive sleep apnea (adult) (pediatric): Secondary | ICD-10-CM

## 2016-08-07 DIAGNOSIS — E669 Obesity, unspecified: Secondary | ICD-10-CM | POA: Diagnosis not present

## 2016-08-07 DIAGNOSIS — E1169 Type 2 diabetes mellitus with other specified complication: Secondary | ICD-10-CM

## 2016-08-07 DIAGNOSIS — E1159 Type 2 diabetes mellitus with other circulatory complications: Secondary | ICD-10-CM

## 2016-08-07 DIAGNOSIS — E119 Type 2 diabetes mellitus without complications: Secondary | ICD-10-CM | POA: Diagnosis not present

## 2016-08-07 NOTE — Patient Instructions (Addendum)
Order- [facility]- Please go ahead with order to replace old/ broken CPAP with Resmed S10 Air auto 5-20, mask of choice, humidifier, supplies, AirView    Dx OSA   Please call as needed

## 2016-08-07 NOTE — Progress Notes (Signed)
   Patient ID: Christopher Stewart, male    DOB: [birth year], 61 y.o.   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 Pt states his machine broke a week ago (new motor was placed); in process of getting new machine. No supplies needed.  He continues to feel he sleeps well with CPAP and depends on it. Qualifies for new machine. Download 100% compliance, AHI 3.3/hour.  ROS-see HPI   + = pos Constitutional:   No-   weight loss, night sweats, fevers, chills, fatigue, lassitude. HEENT:   No-  headaches, difficulty swallowing, tooth/dental problems, sore throat,       No-  sneezing, itching, ear ache, nasal congestion, post nasal drip,  CV:  No-   chest pain, orthopnea, PND, swelling in lower extremities, anasarca, dizziness, palpitations Resp: No-   shortness of breath with exertion or at rest.              No-   productive cough,  No non-productive cough,  No- coughing up of blood.              No-   change in color of mucus.  No- wheezing.   Skin: No-   rash or lesions. GI:  No-   heartburn, indigestion, abdominal pain, nausea, vomiting,  GU:  MS:  No-   joint pain or swelling.   Neuro-     nothing unusual Psych:  No- change in mood or affect. No depression or anxiety.  No memory loss.  OBJ- Physical Exam General- Alert, Oriented, Affect-appropriate, Distress- none acute, + overweight Skin- rash-none, lesions- none, excoriation- none Lymphadenopathy- none Head-  atraumatic            Eyes- Gross vision intact, PERRLA, conjunctivae and secretions clear            Ears- Hearing, canals-normal            Nose- Clear, no-Septal dev, mucus, polyps, erosion, perforation             Throat- Mallampati II-III , mucosa clear , drainage- none, tonsils- atrophic Neck- flexible , trachea midline, no stridor , thyroid nl, carotid no bruit Chest - symmetrical excursion , unlabored           Heart/CV- RRR , no murmur , no gallop  , no rub, nl s1 s2                           - JVD- none , edema- none, stasis changes- none, varices- none           Lung- clear to P&A, wheeze- none, cough- none , dullness-none, rub- none           Chest wall-  Abd-  Br/ Gen/ Rectal- Not done, not indicated Extrem- cyanosis- none, clubbing, none, atrophy- none, strength- nl Neuro- grossly intact to observation

## 2016-08-08 MED FILL — JARDIANCE 25 MG TABLET: 25 | 30 days supply | Qty: 30 | Fill #1

## 2016-08-08 NOTE — Telephone Encounter (Signed)
Pt had OV with CY on 08/07/16. Per Sharla Kidney reviewed compliance report. Order was placed for new machine during office visit. Nothing further needed.

## 2016-08-24 NOTE — Assessment & Plan Note (Signed)
He continues with excellent compliance and control, but wanted to sleep without CPAP. Pressure of 9 remains satisfactory but since he is eligible for a new machine, we will take advantage of AutoPap and set pressure 5-20

## 2016-08-24 NOTE — Assessment & Plan Note (Signed)
He understands the medical importance of seeking normal body weight with potential benefit for management of his comorbidities.

## 2016-09-08 MED FILL — JARDIANCE 25 MG TABLET: 25 | 30 days supply | Qty: 30 | Fill #2

## 2016-09-08 MED FILL — AMLODIPINE BESYLATE 5 MG TA: 5 | 90 days supply | Qty: 90 | Fill #1

## 2016-09-08 MED FILL — ESZOPICLONE 3 MG TABS: 3 | 30 days supply | Qty: 30 | Fill #3

## 2016-09-08 MED FILL — LISINOPRIL 40 MG TABLET: 40 | 90 days supply | Qty: 90 | Fill #1

## 2016-09-22 ENCOUNTER — Other Ambulatory Visit: Payer: Self-pay | Admitting: Internal Medicine

## 2016-09-22 MED FILL — ALLOPURINOL 300 MG TAB: 300 | 90 days supply | Qty: 90 | Fill #0

## 2016-09-30 MED FILL — PANTOPRAZOLE SOD DR 40 MG T: 40 | 90 days supply | Qty: 90 | Fill #4

## 2016-09-30 MED FILL — FREESTYLE LITE TEST STRIP: 75 days supply | Qty: 300 | Fill #1

## 2016-10-03 ENCOUNTER — Encounter: Payer: Self-pay | Admitting: Internal Medicine

## 2016-10-03 ENCOUNTER — Telehealth: Payer: Self-pay | Admitting: Internal Medicine

## 2016-10-03 DIAGNOSIS — I152 Hypertension secondary to endocrine disorders: Secondary | ICD-10-CM

## 2016-10-03 DIAGNOSIS — E1159 Type 2 diabetes mellitus with other circulatory complications: Secondary | ICD-10-CM

## 2016-10-03 DIAGNOSIS — E1169 Type 2 diabetes mellitus with other specified complication: Secondary | ICD-10-CM

## 2016-10-03 DIAGNOSIS — I10 Essential (primary) hypertension: Secondary | ICD-10-CM

## 2016-10-03 DIAGNOSIS — E785 Hyperlipidemia, unspecified: Secondary | ICD-10-CM

## 2016-10-03 DIAGNOSIS — E669 Obesity, unspecified: Secondary | ICD-10-CM

## 2016-10-03 NOTE — Telephone Encounter (Signed)
Please see telephone encounter 10/03/16.

## 2016-10-03 NOTE — Telephone Encounter (Signed)
Per CY >> Please order change DME from [facility], per patient request- see his note. Please print script for replacement CPAP machine as in his note, for him to pick up today, and let him know when ready.  Attempted to contact pt. No answer, no option to leave a message. Will try back.

## 2016-10-03 NOTE — Telephone Encounter (Signed)
I already responded to this issue when he emailed- forwarded [facility] Chart message to Triage about an hour ago.

## 2016-10-03 NOTE — Telephone Encounter (Signed)
Spoke with pt, who is requesting written Rx for cpap. Pt states he does not wish to get cpap therapy with [facility]. Pt states he is researching local companies. Pt has requested that we fax Rx to his office at   [phone].  CY please advise. Thanks.

## 2016-10-03 NOTE — Telephone Encounter (Signed)
[facility] still has not provided me with a new CPAP unit.  [facility] indicates that they have not received a Rx for the new unit even though your note indicates it was sent at my visit of 08/07/16.   I no longer wish to use [facility] for my CPAP unit and supplies. They are unprofessional and have poor patient relation skills.  Can I pick up a Rx for the ResMed S10 unit with Air Auto 5-20, mask of choice, humidifier, supplies prn ,and AirView- today?  I will then take to another Medical Supply business.  Can you recommend another CPAP supply business?  1. [facility] [facility] [address]  2. [employer] [facility] in [place], [place] please advise. Thanks

## 2016-10-03 NOTE — Telephone Encounter (Signed)
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    Thanks,  I have ordered his labs   TT

## 2016-10-03 NOTE — Telephone Encounter (Signed)
Please order change DME from [facility], per patient request- see his note. Please print script for replacement CPAP machine as in his note, for him to pick up today, and let him know when ready.

## 2016-10-03 NOTE — Telephone Encounter (Signed)
Sent patient My chart message with [facility] and [facility] we can do either one or I can find another besides these two.

## 2016-10-06 NOTE — Telephone Encounter (Signed)
ATC pt, it was a general phone message without VM. WCB.

## 2016-10-08 NOTE — Telephone Encounter (Signed)
Attempted to contact pt. No answer, no option to leave a message. Will try back.   Tried pt on his cell phone. Call went straight to voicemail but I could not leave a message. Will call back.

## 2016-10-09 NOTE — Telephone Encounter (Signed)
Attempted to call the pt but the VM was not accepting messages.

## 2016-10-12 MED FILL — ESZOPICLONE 3 MG TABS: 3 | 30 days supply | Qty: 30 | Fill #4

## 2016-10-12 MED FILL — TERAZOSIN 5 MG CAPSULE: 5 | 90 days supply | Qty: 90 | Fill #2

## 2016-10-13 ENCOUNTER — Other Ambulatory Visit: Payer: Self-pay | Admitting: Internal Medicine

## 2016-10-13 MED FILL — ROSUVASTATIN CALCIUM 10 MG: 10 | 90 days supply | Qty: 45 | Fill #1

## 2016-10-13 MED FILL — metFORMIN HCL 1000 MG TABS: 1000 | 90 days supply | Qty: 180 | Fill #0

## 2016-10-13 MED FILL — JARDIANCE 25 MG TABLET: 25 | 30 days supply | Qty: 30 | Fill #3

## 2016-10-13 NOTE — Telephone Encounter (Signed)
Called pt and spoke with pt and he stated that he did get everything taken care of.

## 2016-10-20 ENCOUNTER — Other Ambulatory Visit (INDEPENDENT_AMBULATORY_CARE_PROVIDER_SITE_OTHER): Payer: 59

## 2016-10-20 DIAGNOSIS — I10 Essential (primary) hypertension: Secondary | ICD-10-CM | POA: Diagnosis not present

## 2016-10-20 DIAGNOSIS — E119 Type 2 diabetes mellitus without complications: Secondary | ICD-10-CM

## 2016-10-20 DIAGNOSIS — E785 Hyperlipidemia, unspecified: Secondary | ICD-10-CM

## 2016-10-20 DIAGNOSIS — E669 Obesity, unspecified: Secondary | ICD-10-CM | POA: Diagnosis not present

## 2016-10-20 DIAGNOSIS — E1159 Type 2 diabetes mellitus with other circulatory complications: Principal | ICD-10-CM

## 2016-10-20 DIAGNOSIS — E1169 Type 2 diabetes mellitus with other specified complication: Principal | ICD-10-CM

## 2016-10-20 LAB — LIPID PANEL
CHOL/HDL RATIO: 4
Cholesterol: 117 mg/dL (ref 0–200)
HDL: 27.3 mg/dL — AB (ref >39.00)
LDL CALC: 69 mg/dL (ref 0–99)
NONHDL: 90.16
Triglycerides: 107 mg/dL (ref 0.0–149.0)
VLDL: 21.4 mg/dL (ref 0.0–40.0)

## 2016-10-20 LAB — COMPREHENSIVE METABOLIC PANEL
ALT: 37 U/L (ref 0–53)
AST: 24 U/L (ref 0–37)
Albumin: 4 g/dL (ref 3.5–5.2)
Alkaline Phosphatase: 52 U/L (ref 39–117)
BILIRUBIN TOTAL: 0.4 mg/dL (ref 0.2–1.2)
BUN: 16 mg/dL (ref 6–23)
CO2: 27 meq/L (ref 19–32)
Calcium: 9.7 mg/dL (ref 8.4–10.5)
Chloride: 104 mEq/L (ref 96–112)
Creatinine, Ser: 1.11 mg/dL (ref 0.40–1.50)
GFR: 71.68 mL/min (ref >60.00)
GLUCOSE: 150 mg/dL — AB (ref 70–99)
POTASSIUM: 3.9 meq/L (ref 3.5–5.1)
Sodium: 140 mEq/L (ref 135–145)
Total Protein: 7.1 g/dL (ref 6.0–8.3)

## 2016-10-20 LAB — HEMOGLOBIN A1C: Hgb A1c MFr Bld: 7.8 % — ABNORMAL HIGH (ref 4.6–6.5)

## 2016-10-23 ENCOUNTER — Encounter: Payer: Self-pay | Admitting: Internal Medicine

## 2016-10-30 DIAGNOSIS — H5213 Myopia, bilateral: Secondary | ICD-10-CM | POA: Diagnosis not present

## 2016-10-30 DIAGNOSIS — R0689 Other abnormalities of breathing: Secondary | ICD-10-CM | POA: Diagnosis not present

## 2016-10-30 DIAGNOSIS — G4733 Obstructive sleep apnea (adult) (pediatric): Secondary | ICD-10-CM | POA: Diagnosis not present

## 2016-11-10 ENCOUNTER — Other Ambulatory Visit: Payer: Self-pay | Admitting: Internal Medicine

## 2016-11-10 MED FILL — JARDIANCE 25 MG TABLET: 25 | 30 days supply | Qty: 30 | Fill #4

## 2016-11-10 MED FILL — CELECOXIB 200 MG CAPS: 200 | 90 days supply | Qty: 180 | Fill #0

## 2016-11-10 NOTE — Telephone Encounter (Signed)
Please advise on refill. Thanks. 

## 2016-11-10 NOTE — Telephone Encounter (Signed)
Ok to refill x 6 months 

## 2016-11-11 MED FILL — ESZOPICLONE 3 MG TABS: 3 | 30 days supply | Qty: 30 | Fill #0

## 2016-11-30 DIAGNOSIS — R0689 Other abnormalities of breathing: Secondary | ICD-10-CM | POA: Diagnosis not present

## 2016-11-30 DIAGNOSIS — G4733 Obstructive sleep apnea (adult) (pediatric): Secondary | ICD-10-CM | POA: Diagnosis not present

## 2016-12-01 ENCOUNTER — Encounter: Payer: Self-pay | Admitting: Internal Medicine

## 2016-12-03 MED FILL — AMLODIPINE BESYLATE 5 MG TA: 5 | 90 days supply | Qty: 90 | Fill #2

## 2016-12-04 MED FILL — JARDIANCE 25 MG TABLET: 25 | 30 days supply | Qty: 30 | Fill #5

## 2016-12-09 MED FILL — ESZOPICLONE 3 MG TABS: 3 | 30 days supply | Qty: 30 | Fill #1

## 2016-12-19 ENCOUNTER — Other Ambulatory Visit: Payer: Self-pay | Admitting: Internal Medicine

## 2016-12-19 MED FILL — LISINOPRIL 40 MG TABLET: 40 | 90 days supply | Qty: 90 | Fill #0

## 2016-12-19 MED FILL — ALLOPURINOL 300 MG TAB: 300 | 90 days supply | Qty: 90 | Fill #1

## 2016-12-25 ENCOUNTER — Telehealth: Payer: Self-pay | Admitting: Internal Medicine

## 2016-12-25 ENCOUNTER — Other Ambulatory Visit: Payer: Self-pay | Admitting: Internal Medicine

## 2016-12-25 MED FILL — PANTOPRAZOLE SOD DR 40 MG T: 40 | 90 days supply | Qty: 90 | Fill #0

## 2016-12-25 MED FILL — ROSUVASTATIN CALCIUM 10 MG: 10 | 90 days supply | Qty: 45 | Fill #0

## 2016-12-25 NOTE — Telephone Encounter (Signed)
Spoke with patient-appt made for Friday 12-26-16 at [place] is in [facility] for CPAP usage. Nothing more needed at this time.

## 2016-12-26 ENCOUNTER — Encounter: Payer: Self-pay | Admitting: Internal Medicine

## 2016-12-26 ENCOUNTER — Ambulatory Visit: Payer: 59 | Admitting: Internal Medicine

## 2016-12-26 DIAGNOSIS — G4733 Obstructive sleep apnea (adult) (pediatric): Secondary | ICD-10-CM

## 2016-12-26 DIAGNOSIS — I10 Essential (primary) hypertension: Secondary | ICD-10-CM

## 2016-12-26 DIAGNOSIS — E669 Obesity, unspecified: Secondary | ICD-10-CM

## 2016-12-26 DIAGNOSIS — E119 Type 2 diabetes mellitus without complications: Secondary | ICD-10-CM

## 2016-12-26 DIAGNOSIS — I152 Hypertension secondary to endocrine disorders: Secondary | ICD-10-CM

## 2016-12-26 DIAGNOSIS — E1159 Type 2 diabetes mellitus with other circulatory complications: Secondary | ICD-10-CM

## 2016-12-26 DIAGNOSIS — E1169 Type 2 diabetes mellitus with other specified complication: Secondary | ICD-10-CM

## 2016-12-26 NOTE — Patient Instructions (Signed)
We can continue CPAP auto 5-20, mask of choice, humidifier, supplies, AirView dx OSA  Please call if we can help

## 2016-12-26 NOTE — Progress Notes (Signed)
Patient ID: Christopher Stewart, male    DOB: August 10, 1955, 61 y.o.   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 Pt states his machine broke a week ago (new motor was placed); in process of getting new machine. No supplies needed.  He continues to feel he sleeps well with CPAP and depends on it. Qualifies for new machine. Download 100% compliance, AHI 3.3/hour.  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 Pt wears CPAP nightly-DL attached  He is very comfortable with his AutoPap machine.  Download shows excellent compliance and control-100%, AHI 2.8/hour.  No problem with the pressures.  He prefers his older mask and we discussed choice and replacement of masks. Denies pertinent other medical problems or acute issues.  ROS-see HPI   + = pos Constitutional:   No-   weight loss, night sweats, fevers, chills, fatigue, lassitude. HEENT:   No-  headaches, difficulty swallowing, tooth/dental problems, sore throat,       No-  sneezing, itching, ear ache, nasal congestion, post nasal drip,  CV:  No-   chest pain, orthopnea, PND, swelling in lower extremities, anasarca, dizziness, palpitations Resp: No-   shortness of breath with exertion or at rest.              No-   productive cough,  No non-productive cough,  No- coughing up of blood.    No-   change in color of mucus.  No- wheezing.   Skin: No-   rash or lesions. GI:  No-   heartburn, indigestion, abdominal pain, nausea, vomiting,  GU:  MS:  No-   joint pain or swelling.   Neuro-     nothing unusual Psych:  No- change in mood or affect. No depression or anxiety.  No memory loss.  OBJ- Physical Exam General- Alert, Oriented, Affect-appropriate, Distress- none acute, + overweight Skin- rash-none, lesions- none, excoriation- none Lymphadenopathy- none Head- atraumatic            Eyes- Gross vision intact, PERRLA, conjunctivae and secretions clear            Ears- Hearing, canals-normal            Nose- Clear, no-Septal dev, mucus, polyps, erosion, perforation             Throat- Mallampati II-III , mucosa clear , drainage- none, tonsils- atrophic Neck- flexible , trachea midline, no stridor , thyroid nl, carotid no bruit Chest - symmetrical excursion , unlabored           Heart/CV- RRR , no murmur , no gallop  , no rub, nl s1 s2                           - JVD- none , edema- none, stasis changes- none, varices- none           Lung- clear to P&A, wheeze- none, cough- none , dullness-none, rub- none  Chest wall-  Abd-  Br/ Gen/ Rectal- Not done, not indicated Extrem- cyanosis- none, clubbing, none, atrophy- none, strength- nl Neuro- grossly intact to observation

## 2016-12-26 NOTE — Assessment & Plan Note (Signed)
He recognizes benefit from CPAP.  Download confirms excellent compliance and control with his AutoPap 5-20 machine.  He may decide to make changes, including mask style, and we will help as needed.

## 2016-12-26 NOTE — Assessment & Plan Note (Signed)
Weight loss would be to his advantage and he understands this.

## 2016-12-28 MED FILL — JARDIANCE 25 MG TABLET: 25 | 30 days supply | Qty: 30 | Fill #0

## 2016-12-30 DIAGNOSIS — R0689 Other abnormalities of breathing: Secondary | ICD-10-CM | POA: Diagnosis not present

## 2016-12-30 DIAGNOSIS — G4733 Obstructive sleep apnea (adult) (pediatric): Secondary | ICD-10-CM | POA: Diagnosis not present

## 2017-01-13 MED FILL — TERAZOSIN 5 MG CAPSULE: 5 | 90 days supply | Qty: 90 | Fill #3

## 2017-01-13 MED FILL — ESZOPICLONE 3 MG TABS: 3 | 30 days supply | Qty: 30 | Fill #2

## 2017-01-13 MED FILL — metFORMIN HCL 1000 MG TABS: 1000 | 90 days supply | Qty: 180 | Fill #1

## 2017-01-30 DIAGNOSIS — G4733 Obstructive sleep apnea (adult) (pediatric): Secondary | ICD-10-CM | POA: Diagnosis not present

## 2017-01-30 DIAGNOSIS — R0689 Other abnormalities of breathing: Secondary | ICD-10-CM | POA: Diagnosis not present

## 2017-01-30 MED FILL — JARDIANCE 25 MG TABLET: 25 | 30 days supply | Qty: 30 | Fill #1

## 2017-02-11 MED FILL — ESZOPICLONE 3 MG TABS: 3 | 30 days supply | Qty: 30 | Fill #3

## 2017-02-11 MED FILL — CELECOXIB 200 MG CAPSULE: 200 | 90 days supply | Qty: 180 | Fill #1

## 2017-02-27 ENCOUNTER — Other Ambulatory Visit: Payer: Self-pay | Admitting: Internal Medicine

## 2017-02-27 ENCOUNTER — Ambulatory Visit: Payer: 59 | Admitting: Internal Medicine

## 2017-02-27 MED FILL — AMLODIPINE BESYLATE 5 MG TA: 5 | 90 days supply | Qty: 90 | Fill #0

## 2017-02-27 MED FILL — JARDIANCE 25 MG TABLET: 25 | 30 days supply | Qty: 30 | Fill #2

## 2017-03-02 DIAGNOSIS — G4733 Obstructive sleep apnea (adult) (pediatric): Secondary | ICD-10-CM | POA: Diagnosis not present

## 2017-03-02 DIAGNOSIS — R0689 Other abnormalities of breathing: Secondary | ICD-10-CM | POA: Diagnosis not present

## 2017-03-16 ENCOUNTER — Other Ambulatory Visit: Payer: Self-pay | Admitting: Internal Medicine

## 2017-03-16 MED FILL — ESZOPICLONE 3 MG TABS: 3 | 30 days supply | Qty: 30 | Fill #4

## 2017-03-16 MED FILL — LISINOPRIL 40 MG TABLET: 40 | 90 days supply | Qty: 90 | Fill #1

## 2017-03-16 MED FILL — ALLOPURINOL 300 MG TAB: 300 | 90 days supply | Qty: 90 | Fill #0

## 2017-03-30 ENCOUNTER — Other Ambulatory Visit: Payer: Self-pay | Admitting: Internal Medicine

## 2017-03-30 DIAGNOSIS — R0689 Other abnormalities of breathing: Secondary | ICD-10-CM | POA: Diagnosis not present

## 2017-03-30 DIAGNOSIS — G4733 Obstructive sleep apnea (adult) (pediatric): Secondary | ICD-10-CM | POA: Diagnosis not present

## 2017-03-30 MED FILL — JARDIANCE 25 MG TABLET: 25 | 30 days supply | Qty: 30 | Fill #3

## 2017-03-30 MED FILL — PANTOPRAZOLE SOD DR 40 MG T: 40 | 90 days supply | Qty: 90 | Fill #0

## 2017-03-31 ENCOUNTER — Telehealth: Payer: Self-pay

## 2017-03-31 NOTE — Telephone Encounter (Signed)
Copied from [place] [id number]. Topic: Appointment Scheduling - Scheduling Inquiry for Clinic >> Mar 31, 2017  9:12 AM [name] wrote: Reason for [id number] scheduled for April 5, needing to change appt to anytime on April 11,12,15. Able to work in? Please advise

## 2017-03-31 NOTE — Telephone Encounter (Signed)
Yes, that's fine 

## 2017-03-31 NOTE — Telephone Encounter (Signed)
Would it be okay to change pt's appt to April 11th at 11:30am?

## 2017-04-01 NOTE — Telephone Encounter (Signed)
Please change and add slot. Patient has not been notified.

## 2017-04-01 NOTE — Telephone Encounter (Signed)
Patient has been scheduled for 04/23/17 at 11:30 [facility] may notify patient.

## 2017-04-03 ENCOUNTER — Encounter: Payer: Self-pay | Admitting: Internal Medicine

## 2017-04-03 DIAGNOSIS — I10 Essential (primary) hypertension: Principal | ICD-10-CM

## 2017-04-03 DIAGNOSIS — E669 Obesity, unspecified: Secondary | ICD-10-CM

## 2017-04-03 DIAGNOSIS — E1169 Type 2 diabetes mellitus with other specified complication: Principal | ICD-10-CM

## 2017-04-03 DIAGNOSIS — E1159 Type 2 diabetes mellitus with other circulatory complications: Principal | ICD-10-CM

## 2017-04-03 DIAGNOSIS — Z125 Encounter for screening for malignant neoplasm of prostate: Secondary | ICD-10-CM

## 2017-04-06 ENCOUNTER — Encounter: Payer: Self-pay | Admitting: Internal Medicine

## 2017-04-06 MED ORDER — FREESTYLE LANCETS MISC: 2 refills | Status: DC

## 2017-04-06 MED ORDER — METFORMIN HCL 1000 MG PO TABS: ORAL_TABLET | ORAL | 1 refills | Status: DC

## 2017-04-06 MED ORDER — GLUCOSE BLOOD VI STRP: ORAL_STRIP | 2 refills | Status: DC

## 2017-04-07 ENCOUNTER — Telehealth: Payer: Self-pay | Admitting: Internal Medicine

## 2017-04-07 MED FILL — metFORMIN HCL 1000 MG TABS: 1000 | 90 days supply | Qty: 180 | Fill #0

## 2017-04-07 NOTE — Telephone Encounter (Signed)
Copied from [place]. Topic: Quick Communication - See Telephone Encounter >> Apr 07, 2017  8:34 AM Ahmed Prima L wrote: CRM for notification. See Telephone encounter for: 04/07/17.  [place] needs clarification on the directions glucose blood test strip & lancets. Call back is [phone]

## 2017-04-08 MED ORDER — FREESTYLE LANCETS MISC: 2 refills | Status: DC

## 2017-04-08 MED ORDER — GLUCOSE BLOOD VI STRP: ORAL_STRIP | 2 refills | Status: DC

## 2017-04-08 MED FILL — FREESTYLE LITE TEST STRIP: 90 days supply | Qty: 100 | Fill #0

## 2017-04-08 MED FILL — FREESTYLE LANCETS: 90 days supply | Qty: 100 | Fill #0

## 2017-04-08 NOTE — Telephone Encounter (Signed)
Resent to [employer] with clarification

## 2017-04-10 ENCOUNTER — Other Ambulatory Visit (INDEPENDENT_AMBULATORY_CARE_PROVIDER_SITE_OTHER): Payer: 59

## 2017-04-10 DIAGNOSIS — E1169 Type 2 diabetes mellitus with other specified complication: Secondary | ICD-10-CM

## 2017-04-10 DIAGNOSIS — E1159 Type 2 diabetes mellitus with other circulatory complications: Secondary | ICD-10-CM

## 2017-04-10 DIAGNOSIS — I10 Essential (primary) hypertension: Secondary | ICD-10-CM | POA: Diagnosis not present

## 2017-04-10 DIAGNOSIS — E119 Type 2 diabetes mellitus without complications: Secondary | ICD-10-CM | POA: Diagnosis not present

## 2017-04-10 DIAGNOSIS — E669 Obesity, unspecified: Secondary | ICD-10-CM | POA: Diagnosis not present

## 2017-04-10 DIAGNOSIS — Z125 Encounter for screening for malignant neoplasm of prostate: Secondary | ICD-10-CM | POA: Diagnosis not present

## 2017-04-10 LAB — COMPREHENSIVE METABOLIC PANEL
ALT: 41 U/L (ref 0–53)
AST: 26 U/L (ref 0–37)
Albumin: 3.9 g/dL (ref 3.5–5.2)
Alkaline Phosphatase: 58 U/L (ref 39–117)
BILIRUBIN TOTAL: 0.5 mg/dL (ref 0.2–1.2)
BUN: 25 mg/dL — AB (ref 6–23)
CALCIUM: 9.7 mg/dL (ref 8.4–10.5)
CHLORIDE: 105 meq/L (ref 96–112)
CO2: 27 meq/L (ref 19–32)
CREATININE: 1.09 mg/dL (ref 0.40–1.50)
GFR: 73.08 mL/min (ref >60.00)
Glucose, Bld: 182 mg/dL — ABNORMAL HIGH (ref 70–99)
Potassium: 4.7 mEq/L (ref 3.5–5.1)
SODIUM: 140 meq/L (ref 135–145)
Total Protein: 6.7 g/dL (ref 6.0–8.3)

## 2017-04-10 LAB — MICROALBUMIN / CREATININE URINE RATIO
Creatinine,U: 63.9 mg/dL
MICROALB/CREAT RATIO: 1.1 mg/g (ref 0.0–30.0)
Microalb, Ur: 0.7 mg/dL (ref 0.0–1.9)

## 2017-04-10 LAB — PSA: PSA: 0.31 ng/mL (ref 0.10–4.00)

## 2017-04-10 LAB — HEMOGLOBIN A1C: HEMOGLOBIN A1C: 8.1 % — AB (ref 4.6–6.5)

## 2017-04-10 MED FILL — TERAZOSIN 5 MG CAPSULE: 5 | 90 days supply | Qty: 90 | Fill #0

## 2017-04-13 MED FILL — ESZOPICLONE 3 MG TABS: 3 | 30 days supply | Qty: 30 | Fill #5

## 2017-04-17 ENCOUNTER — Ambulatory Visit: Payer: 59 | Admitting: Internal Medicine

## 2017-04-23 ENCOUNTER — Ambulatory Visit (INDEPENDENT_AMBULATORY_CARE_PROVIDER_SITE_OTHER): Payer: 59 | Admitting: Internal Medicine

## 2017-04-23 ENCOUNTER — Encounter: Payer: Self-pay | Admitting: Internal Medicine

## 2017-04-23 ENCOUNTER — Other Ambulatory Visit: Payer: Self-pay

## 2017-04-23 VITALS — BP 132/76 | HR 78 | Temp 98.0°F | Wt 300.4 lb

## 2017-04-23 DIAGNOSIS — Z1283 Encounter for screening for malignant neoplasm of skin: Secondary | ICD-10-CM

## 2017-04-23 DIAGNOSIS — E669 Obesity, unspecified: Secondary | ICD-10-CM | POA: Diagnosis not present

## 2017-04-23 DIAGNOSIS — Z1211 Encounter for screening for malignant neoplasm of colon: Secondary | ICD-10-CM

## 2017-04-23 DIAGNOSIS — D492 Neoplasm of unspecified behavior of bone, soft tissue, and skin: Secondary | ICD-10-CM

## 2017-04-23 DIAGNOSIS — E119 Type 2 diabetes mellitus without complications: Secondary | ICD-10-CM | POA: Diagnosis not present

## 2017-04-23 DIAGNOSIS — E785 Hyperlipidemia, unspecified: Secondary | ICD-10-CM

## 2017-04-23 DIAGNOSIS — I10 Essential (primary) hypertension: Secondary | ICD-10-CM | POA: Diagnosis not present

## 2017-04-23 DIAGNOSIS — E1159 Type 2 diabetes mellitus with other circulatory complications: Secondary | ICD-10-CM

## 2017-04-23 DIAGNOSIS — E1169 Type 2 diabetes mellitus with other specified complication: Secondary | ICD-10-CM

## 2017-04-23 DIAGNOSIS — G4733 Obstructive sleep apnea (adult) (pediatric): Secondary | ICD-10-CM

## 2017-04-23 MED ORDER — SITAGLIPTIN PHOSPHATE 25 MG PO TABS: 25.0000 mg | ORAL_TABLET | Freq: Every day | ORAL | 2 refills | Status: DC

## 2017-04-23 NOTE — Progress Notes (Signed)
Subjective:  Patient ID: Christopher Stewart, male    DOB: 24-Apr-1955  Age: 62 y.o. MRN: [id number]  CC: The primary encounter diagnosis was Skin neoplasm. Diagnoses of Skin cancer screening, Colon cancer screening, Obesity, diabetes, and hypertension syndrome ([place]), Obstructive sleep apnea, Hyperlipidemia LDL goal <100, Essential hypertension, and Morbid obesity ([place]) were also pertinent to this visit.  HPI Christopher Stewart presents for 3 month follow up on diabetes, morbid obesity.  Patient has no complaints today.  Patient is  Not following a low glycemic index diet but he is  taking all prescribed medications regularly without side effects.  Fasting sugars  have been  less than 150 most of the time and post prandials have been under 180 except on rare occasions. Patient is not exercising  .  Patient has had an eye exam in the last 12 months and checks feet regularly for signs of infection.  Patient does not walk barefoot outside,  And denies an numbness tingling or burning in feet. Patient is up to date on all recommended vaccinations  Dr Katy Fitch eye exam nov 2018 . No retinopathy,  Annual follow up  Wearing CPAP,   8 hours ,  99%  Scores.  9 to 12 mm H20   Lab Results  Component Value Date   PSA 0.31 04/10/2017   PSA 0.253 11/27/2015   PSA 0.253 05/14/2015    Needs colonoscy with gessner   Lab Results  Component Value Date   HGBA1C 8.1 (H) 04/10/2017     Outpatient Medications Prior to Visit  Medication Sig Dispense Refill  . acetaminophen (TYLENOL) 500 MG tablet Take 1,000 mg by mouth every 6 (six) hours as needed.    Marland Kitchen allopurinol (ZYLOPRIM) 300 MG tablet TAKE 1 TABLET BY MOUTH ONCE DAILY 90 tablet 1  . amLODipine (NORVASC) 5 MG tablet TAKE 1 TABLET BY MOUTH ONCE DAILY 90 tablet 2  . aspirin 81 MG tablet Take 81 mg by mouth every other day.     . celecoxib (CELEBREX) 200 MG capsule TAKE 1 CAPSULE BY MOUTH TWICE DAILY 180 capsule 1  . Eszopiclone 3 MG TABS TAKE 1 TABLET BY  MOUTH AT BEDTIME AS NEEDED FOR SLEEP 30 tablet 5  . glucose blood test strip Use to check blood sugar once daily 100 each 2  . JARDIANCE 25 MG TABS tablet TAKE 1 TABLET BY MOUTH ONCE DAILY 30 tablet 5  . Lancets (FREESTYLE) lancets Use to check blood sugar once daily 100 each 2  . lisinopril (PRINIVIL,ZESTRIL) 40 MG tablet TAKE 1 TABLET BY MOUTH ONCE DAILY 90 tablet 1  . metFORMIN (GLUCOPHAGE) 1000 MG tablet TAKE 1 TABLET BY MOUTH TWICE A DAY WITH A MEAL 180 tablet 1  . pantoprazole (PROTONIX) 40 MG tablet TAKE 1 TABLET BY MOUTH ONCE DAILY 90 tablet 1  . rosuvastatin (CRESTOR) 10 MG tablet TAKE 1 TABLET BY MOUTH EVERY OTHER DAY. 45 tablet 1  . terazosin (HYTRIN) 5 MG capsule Take 5 mg by mouth daily.     No facility-administered medications prior to visit.     Review of Systems;  Patient denies headache, fevers, malaise, unintentional weight loss, skin rash, eye pain, sinus congestion and sinus pain, sore throat, dysphagia,  hemoptysis , cough, dyspnea, wheezing, chest pain, palpitations, orthopnea, edema, abdominal pain, nausea, melena, diarrhea, constipation, flank pain, dysuria, hematuria, urinary  Frequency, nocturia, numbness, tingling, seizures,  Focal weakness, Loss of consciousness,  Tremor, insomnia, depression, anxiety, and suicidal ideation.  Objective:  BP 132/76 (BP Location: Left Arm, Patient Position: Sitting, Cuff Size: Normal)   Pulse 78   Temp 98 F (36.7 C)   Wt (!) 300 lb 6.4 oz (136.3 kg)   SpO2 97%   BMI 43.10 kg/m   BP Readings from Last 3 Encounters:  04/23/17 132/76  12/26/16 130/74  08/07/16 130/72    Wt Readings from Last 3 Encounters:  04/23/17 (!) 300 lb 6.4 oz (136.3 kg)  12/26/16 295 lb 12.8 oz (134.2 kg)  08/07/16 294 lb 9.6 oz (133.6 kg)    General appearance: alert, cooperative and appears stated age Ears: normal TM's and external ear canals both ears Throat: lips, mucosa, and tongue normal; teeth and gums normal Neck: no adenopathy,  no carotid bruit, supple, symmetrical, trachea midline and thyroid not enlarged, symmetric, no tenderness/mass/nodules Back: symmetric, no curvature. ROM normal. No CVA tenderness. Lungs: clear to auscultation bilaterally Heart: regular rate and rhythm, S1, S2 normal, no murmur, click, rub or gallop Abdomen: soft, non-tender; bowel sounds normal; no masses,  no organomegaly Pulses: 2+ and symmetric Skin: Skin color, texture, turgor normal. No rashes or lesions Lymph nodes: Cervical, supraclavicular, and axillary nodes normal.  Lab Results  Component Value Date   HGBA1C 8.1 (H) 04/10/2017   HGBA1C 7.8 (H) 10/20/2016   HGBA1C 8.0 (H) 07/07/2016    Lab Results  Component Value Date   CREATININE 1.09 04/10/2017   CREATININE 1.11 10/20/2016   CREATININE 1.15 07/07/2016    Lab Results  Component Value Date   WBC 7.3 11/27/2015   HGB 13.7 11/27/2015   HCT 42 11/27/2015   PLT 284 11/27/2015   GLUCOSE 182 (H) 04/10/2017   CHOL 117 10/20/2016   TRIG 107.0 10/20/2016   HDL 27.30 (L) 10/20/2016   LDLDIRECT 87.0 11/19/2015   LDLCALC 69 10/20/2016   ALT 41 04/10/2017   AST 26 04/10/2017   NA 140 04/10/2017   K 4.7 04/10/2017   CL 105 04/10/2017   CREATININE 1.09 04/10/2017   BUN 25 (H) 04/10/2017   CO2 27 04/10/2017   TSH 2.15 11/27/2015   PSA 0.31 04/10/2017   HGBA1C 8.1 (H) 04/10/2017   MICROALBUR 0.7 04/10/2017    No results found.  Assessment & Plan:   Problem List Items Addressed This Visit    Obstructive sleep apnea (Chronic)    Diagnosed by sleep study. he is wearing her CPAP every night a minimum of 6 hours per night and notes improved daytime wakefulness and decreased fatigue       Obesity, diabetes, and hypertension syndrome ([place])    Not at goal on maximal dose of Jardiance.  Adding Januvia.. Continue metformin and lisiinopril , pravastatin and aspirin   Lab Results  Component Value Date   HGBA1C 8.1 (H) 04/10/2017   Lab Results  Component Value Date    MICROALBUR 0.7 04/10/2017         Relevant Medications   sitaGLIPtin (JANUVIA) 25 MG tablet   Morbid obesity ([place])    complicated by diabetes, hypertension and OSA> I  have addressed  BMI and recommended wt loss of 10% of body weigh over the next 6 months using a low glycemic index diet and regular exercise a minimum of 5 days per week.        Relevant Medications   sitaGLIPtin (JANUVIA) 25 MG tablet   Hyperlipidemia LDL goal <100    Managed with every other day crestor 10 mg .  No changes today  Lab Results  Component Value Date   CHOL 117 10/20/2016   HDL 27.30 (L) 10/20/2016   LDLCALC 69 10/20/2016   LDLDIRECT 87.0 11/19/2015   TRIG 107.0 10/20/2016   CHOLHDL 4 10/20/2016   Lab Results  Component Value Date   ALT 41 04/10/2017   AST 26 04/10/2017   ALKPHOS 58 04/10/2017   BILITOT 0.5 04/10/2017         Essential hypertension    Well controlled on current regimen. Renal function stable, no changes today.  Lab Results  Component Value Date   CREATININE 1.09 04/10/2017   Lab Results  Component Value Date   NA 140 04/10/2017   K 4.7 04/10/2017   CL 105 04/10/2017   CO2 27 04/10/2017          Other Visit Diagnoses    Skin neoplasm    -  Primary   Skin cancer screening       Relevant Orders   Ambulatory referral to Dermatology   Colon cancer screening       Relevant Orders   Ambulatory referral to Gastroenterology      I am having Lenn Cal start on sitaGLIPtin. I am also having him maintain his aspirin, terazosin, acetaminophen, celecoxib, Eszopiclone, lisinopril, JARDIANCE, rosuvastatin, amLODipine, allopurinol, pantoprazole, metFORMIN, freestyle, and glucose blood.  Meds ordered this encounter  Medications  . sitaGLIPtin (JANUVIA) 25 MG tablet    Sig: Take 1 tablet (25 mg total) by mouth daily.    Dispense:  30 tablet    Refill:  2    There are no discontinued medications.  Follow-up: No follow-ups on file.   Crecencio Mc, MD

## 2017-04-23 NOTE — Patient Instructions (Signed)
Sitagliptin oral tablet What is this medicine? SITAGLIPTIN (sit a GLIP tin) helps to treat type 2 diabetes. It helps to control blood sugar. Treatment is combined with diet and exercise. This medicine may be used for other purposes; ask your health care provider or pharmacist if you have questions. COMMON BRAND NAME(S): Januvia What should I tell my health care provider before I take this medicine? They need to know if you have any of these conditions: -diabetic ketoacidosis -kidney disease -pancreatitis -previous swelling of the tongue, face, or lips with difficulty breathing, difficulty swallowing, hoarseness, or tightening of the throat -type 1 diabetes -an unusual or allergic reaction to sitagliptin, other medicines, foods, dyes, or preservatives -pregnant or trying to get pregnant -breast-feeding How should I use this medicine? Take this medicine by mouth with a glass of water. Follow the directions on the prescription label. You can take it with or without food. Do not cut, crush or chew this medicine. Take your dose at the same time each day. Do not take more often than directed. Do not stop taking except on your doctor's advice. A special MedGuide will be given to you by the pharmacist with each prescription and refill. Be sure to read this information carefully each time. Talk to your pediatrician regarding the use of this medicine in children. Special care may be needed. Overdosage: If you think you have taken too much of this medicine contact a poison control center or emergency room at once. NOTE: This medicine is only for you. Do not share this medicine with others. What if I miss a dose? If you miss a dose, take it as soon as you can. If it is almost time for your next dose, take only that dose. Do not take double or extra doses. What may interact with this medicine? Do not take this medicine with any of the following medications: -gatifloxacin This medicine may also interact  with the following medications: -alcohol -digoxin -insulin -sulfonylureas like glimepiride, glipizide, glyburide This list may not describe all possible interactions. Give your health care provider a list of all the medicines, herbs, non-prescription drugs, or dietary supplements you use. Also tell them if you smoke, drink alcohol, or use illegal drugs. Some items may interact with your medicine. What should I watch for while using this medicine? Visit your doctor or health care professional for regular checks on your progress. A test called the HbA1C (A1C) will be monitored. This is a simple blood test. It measures your blood sugar control over the last 2 to 3 months. You will receive this test every 3 to 6 months. Learn how to check your blood sugar. Learn the symptoms of low and high blood sugar and how to manage them. Always carry a quick-source of sugar with you in case you have symptoms of low blood sugar. Examples include hard sugar candy or glucose tablets. Make sure others know that you can choke if you eat or drink when you develop serious symptoms of low blood sugar, such as seizures or unconsciousness. They must get medical help at once. Tell your doctor or health care professional if you have high blood sugar. You might need to change the dose of your medicine. If you are sick or exercising more than usual, you might need to change the dose of your medicine. Do not skip meals. Ask your doctor or health care professional if you should avoid alcohol. Many nonprescription cough and cold products contain sugar or alcohol. These can affect blood sugar.   Wear a medical ID bracelet or chain, and carry a card that describes your disease and details of your medicine and dosage times. What side effects may I notice from receiving this medicine? Side effects that you should report to your doctor or health care professional as soon as possible: -allergic reactions like skin rash, itching or hives,  swelling of the face, lips, or tongue -breathing problems -general ill feeling or flu-like symptoms -joint pain -loss of appetite -redness, blistering, peeling or loosening of the skin, including inside the mouth -signs and symptoms of low blood sugar such as feeling anxious, confusion, dizziness, increased hunger, unusually weak or tired, sweating, shakiness, cold, irritable, headache, blurred vision, fast heartbeat, loss of consciousness -unusual stomach pain or discomfort -vomiting Side effects that usually do not require medical attention (report to your doctor or health care professional if they continue or are bothersome): -diarrhea -headache -sore throat -stomach upset -stuffy or runny nose This list may not describe all possible side effects. Call your doctor for medical advice about side effects. You may report side effects to FDA at 1-800-FDA-1088. Where should I keep my medicine? Keep out of the reach of children. Store at room temperature between 15 and 30 degrees C (59 and 86 degrees F). Throw away any unused medicine after the expiration date. NOTE: This sheet is a summary. It may not cover all possible information. If you have questions about this medicine, talk to your doctor, pharmacist, or health care provider.  2018 Elsevier/Gold Standard (2015-06-29 14:23:55)  

## 2017-04-25 NOTE — Assessment & Plan Note (Signed)
complicated by diabetes, hypertension and OSA> I  have addressed  BMI and recommended wt loss of 10% of body weigh over the next 6 months using a low glycemic index diet and regular exercise a minimum of 5 days per week.

## 2017-04-25 NOTE — Assessment & Plan Note (Signed)
Diagnosed by sleep study. he is wearing her CPAP every night a minimum of 6 hours per night and notes improved daytime wakefulness and decreased fatigue  

## 2017-04-25 NOTE — Assessment & Plan Note (Signed)
Not at goal on maximal dose of Jardiance.  Adding Januvia.. Continue metformin and lisiinopril , pravastatin and aspirin   Lab Results  Component Value Date   HGBA1C 8.1 (H) 04/10/2017   Lab Results  Component Value Date   MICROALBUR 0.7 04/10/2017

## 2017-04-25 NOTE — Assessment & Plan Note (Signed)
Managed with every other day crestor 10 mg .  No changes today  Lab Results  Component Value Date   CHOL 117 10/20/2016   HDL 27.30 (L) 10/20/2016   LDLCALC 69 10/20/2016   LDLDIRECT 87.0 11/19/2015   TRIG 107.0 10/20/2016   CHOLHDL 4 10/20/2016   Lab Results  Component Value Date   ALT 41 04/10/2017   AST 26 04/10/2017   ALKPHOS 58 04/10/2017   BILITOT 0.5 04/10/2017

## 2017-04-25 NOTE — Assessment & Plan Note (Signed)
Well controlled on current regimen. Renal function stable, no changes today.  Lab Results  Component Value Date   CREATININE 1.09 04/10/2017   Lab Results  Component Value Date   NA 140 04/10/2017   K 4.7 04/10/2017   CL 105 04/10/2017   CO2 27 04/10/2017

## 2017-04-27 MED FILL — JANUVIA 25 MG TABLET: 25 | 60 days supply | Qty: 60 | Fill #0

## 2017-04-28 ENCOUNTER — Encounter (INDEPENDENT_AMBULATORY_CARE_PROVIDER_SITE_OTHER): Payer: Self-pay

## 2017-05-01 MED FILL — JARDIANCE 25 MG TABLET: 25 | 30 days supply | Qty: 30 | Fill #4

## 2017-05-04 MED FILL — ROSUVASTATIN CALCIUM 10 MG: 10 | 90 days supply | Qty: 45 | Fill #1

## 2017-05-07 DIAGNOSIS — G4733 Obstructive sleep apnea (adult) (pediatric): Secondary | ICD-10-CM | POA: Diagnosis not present

## 2017-05-14 ENCOUNTER — Encounter: Payer: Self-pay | Admitting: Internal Medicine

## 2017-05-14 ENCOUNTER — Other Ambulatory Visit: Payer: Self-pay | Admitting: Internal Medicine

## 2017-05-14 MED ORDER — PREDNISONE 10 MG PO TABS: ORAL_TABLET | ORAL | 0 refills | Status: DC

## 2017-05-14 MED ORDER — SITAGLIPTIN PHOSPHATE 50 MG PO TABS: 50.0000 mg | ORAL_TABLET | Freq: Every day | ORAL | 1 refills | Status: DC

## 2017-05-14 MED ORDER — CELECOXIB 200 MG PO CAPS: 200.0000 mg | ORAL_CAPSULE | Freq: Two times a day (BID) | ORAL | 1 refills | Status: DC

## 2017-05-14 MED FILL — predniSONE 10 MG (21) TBPK: 10 | 6 days supply | Qty: 21 | Fill #0

## 2017-05-14 MED FILL — JANUVIA 50 MG TABLET: 50 | 30 days supply | Qty: 30 | Fill #0

## 2017-05-14 MED FILL — CELECOXIB 200 MG CAPSULE: 200 | 90 days supply | Qty: 180 | Fill #0

## 2017-05-14 MED FILL — ESZOPICLONE 3 MG TABLET: 3 | 30 days supply | Qty: 30 | Fill #0

## 2017-05-14 NOTE — Telephone Encounter (Signed)
Ok to refill total 6 months 

## 2017-05-14 NOTE — Progress Notes (Signed)
meds sent to [facility]

## 2017-05-14 NOTE — Telephone Encounter (Signed)
CY Please advise on refill. Thanks.  

## 2017-05-25 MED FILL — AMLODIPINE BESYLATE 5 MG TA: 5 | 90 days supply | Qty: 90 | Fill #1

## 2017-05-25 MED FILL — JARDIANCE 25 MG TABLET: 25 | 30 days supply | Qty: 30 | Fill #5

## 2017-06-16 ENCOUNTER — Other Ambulatory Visit: Payer: Self-pay | Admitting: Internal Medicine

## 2017-06-16 MED FILL — ESZOPICLONE 3 MG TABLET: 3 | 30 days supply | Qty: 30 | Fill #1

## 2017-06-16 MED FILL — JANUVIA 50 MG TABLET: 50 | 30 days supply | Qty: 30 | Fill #1

## 2017-06-16 MED FILL — ALLOPURINOL 300 MG TABS: 300 | 90 days supply | Qty: 90 | Fill #1

## 2017-06-18 ENCOUNTER — Encounter: Payer: Self-pay | Admitting: Internal Medicine

## 2017-06-18 MED FILL — LISINOPRIL 40 MG TABLET: 40 | 90 days supply | Qty: 90 | Fill #0

## 2017-06-30 ENCOUNTER — Other Ambulatory Visit: Payer: Self-pay | Admitting: Internal Medicine

## 2017-06-30 MED FILL — metFORMIN HCL 1000 MG TABS: 1000 | 90 days supply | Qty: 180 | Fill #1

## 2017-06-30 MED FILL — JARDIANCE 25 MG TABLET: 25 | 90 days supply | Qty: 90 | Fill #0

## 2017-06-30 MED FILL — PANTOPRAZOLE SOD DR 40 MG T: 40 | 90 days supply | Qty: 90 | Fill #1

## 2017-07-08 DIAGNOSIS — Z1283 Encounter for screening for malignant neoplasm of skin: Secondary | ICD-10-CM | POA: Diagnosis not present

## 2017-07-08 DIAGNOSIS — B359 Dermatophytosis, unspecified: Secondary | ICD-10-CM | POA: Diagnosis not present

## 2017-07-08 DIAGNOSIS — F424 Excoriation (skin-picking) disorder: Secondary | ICD-10-CM | POA: Diagnosis not present

## 2017-07-08 DIAGNOSIS — L739 Follicular disorder, unspecified: Secondary | ICD-10-CM | POA: Diagnosis not present

## 2017-07-08 DIAGNOSIS — L814 Other melanin hyperpigmentation: Secondary | ICD-10-CM | POA: Diagnosis not present

## 2017-07-08 DIAGNOSIS — B351 Tinea unguium: Secondary | ICD-10-CM | POA: Diagnosis not present

## 2017-07-08 DIAGNOSIS — L821 Other seborrheic keratosis: Secondary | ICD-10-CM | POA: Diagnosis not present

## 2017-07-08 DIAGNOSIS — D229 Melanocytic nevi, unspecified: Secondary | ICD-10-CM | POA: Diagnosis not present

## 2017-07-08 DIAGNOSIS — D1800 Hemangioma unspecified site: Secondary | ICD-10-CM | POA: Diagnosis not present

## 2017-07-14 ENCOUNTER — Other Ambulatory Visit: Payer: Self-pay | Admitting: Internal Medicine

## 2017-07-14 MED ORDER — SITAGLIPTIN PHOSPHATE 100 MG PO TABS
100.0000 mg | ORAL_TABLET | Freq: Every day | ORAL | 1 refills | Status: DC
Start: 2017-07-14 — End: 2017-10-25

## 2017-07-14 MED FILL — JANUVIA 100 MG TABLET: 100 | 30 days supply | Qty: 30 | Fill #0

## 2017-07-19 MED FILL — TERAZOSIN 5 MG CAPSULE: 5 | 90 days supply | Qty: 90 | Fill #1

## 2017-07-20 MED FILL — ESZOPICLONE 3 MG TABS: 3 | 30 days supply | Qty: 30 | Fill #2

## 2017-08-11 MED FILL — JANUVIA 100 MG TABLET: 100 | 30 days supply | Qty: 30 | Fill #1

## 2017-08-11 MED FILL — CELECOXIB 200 MG CAP: 200 | 90 days supply | Qty: 180 | Fill #1

## 2017-08-11 MED FILL — FREESTYLE LANCETS: 90 days supply | Qty: 100 | Fill #1

## 2017-08-12 ENCOUNTER — Encounter: Payer: Self-pay | Admitting: Internal Medicine

## 2017-08-13 ENCOUNTER — Encounter: Payer: Self-pay | Admitting: Internal Medicine

## 2017-08-13 ENCOUNTER — Ambulatory Visit: Payer: 59 | Admitting: Internal Medicine

## 2017-08-13 VITALS — BP 124/76 | HR 82 | Ht 70.0 in | Wt 304.4 lb

## 2017-08-13 DIAGNOSIS — G4733 Obstructive sleep apnea (adult) (pediatric): Secondary | ICD-10-CM

## 2017-08-13 NOTE — Progress Notes (Signed)
Patient ID: Christopher Stewart, male    DOB: [birth year], 62 y.o.   MRN: [id number]  HPI 83 yoM,never smoker,  hospital -based dentist, self referred for assessment of obstructive sleep apnea, insomnia NPSG 10/18/09- [facility]- AHI 52.1/hr. 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 Pt wears CPAP nightly-DL attached  He is very comfortable with his AutoPap machine.  Download shows excellent compliance and control-100%, AHI 2.8/hour.  No problem with the pressures.  He prefers his older mask and we discussed choice and replacement of masks. Denies pertinent other medical problems or acute issues.  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 Pt is wearing CPAP nightly and DL attached. No new supplies needed at this time. Weight today 304 pounds Pressure rarely gets too high.  We reviewed his download which looks very good and he decided not to make changes.  Download 100% compliance AHI 2.1/hour with pressure ranging 10-15.  No acute concerns and no changes in his health otherwise.  ROS-see HPI   + = positive Constitutional:   No-   weight loss, night sweats, fevers, chills, fatigue, lassitude. HEENT:   No-  headaches, difficulty swallowing, tooth/dental problems, sore throat,       No-  sneezing, itching, ear ache, nasal congestion, post nasal drip,  CV:  No-   chest pain, orthopnea, PND, swelling in lower extremities, anasarca, dizziness, palpitations Resp: No-   shortness of breath with exertion or at rest.              No-   productive cough,  No non-productive cough,  No- coughing up of blood.              No-   change in color of mucus.   No- wheezing.   Skin: No-   rash or lesions. GI:  No-   heartburn, indigestion, abdominal pain, nausea, vomiting,  GU:  MS:  No-   joint pain or swelling.   Neuro-     nothing unusual Psych:  No- change in mood or affect. No depression or anxiety.  No memory loss.  OBJ- Physical Exam General- Alert, Oriented, Affect-appropriate, Distress- none acute, + obese Skin- rash-none, lesions- none, excoriation- none Lymphadenopathy- none Head- atraumatic            Eyes- Gross vision intact, PERRLA, conjunctivae and secretions clear            Ears- Hearing, canals-normal            Nose- Clear, no-Septal dev, mucus, polyps, erosion, perforation             Throat- Mallampati II-III , mucosa clear , drainage- none, tonsils- atrophic Neck- flexible , trachea midline, no stridor , thyroid nl, carotid no bruit Chest - symmetrical excursion , unlabored           Heart/CV- RRR , no murmur , no gallop  , no rub, nl s1 s2                           - JVD- none , edema- none, stasis changes- none, varices- none           Lung- clear to P&A, wheeze- none, cough- none ,  dullness-none, rub- none           Chest wall-  Abd-  Br/ Gen/ Rectal- Not done, not indicated Extrem- cyanosis- none, clubbing, none, atrophy- none, strength- nl Neuro- grossly intact to observation

## 2017-08-13 NOTE — Patient Instructions (Addendum)
We can continue CPAP auto 5-20, mask of choice, humidifier, supplies, AirView  Please call if we can help 

## 2017-08-14 NOTE — Assessment & Plan Note (Signed)
He understands the importance of weight control and is encouraged to continue his efforts.

## 2017-08-14 NOTE — Assessment & Plan Note (Signed)
He continues to benefit from CPAP with better sleep and controlled snoring.  Download confirms excellent compliance and control.  We discussed pressure and comfort.  He is satisfied to continue current settings. Plan-continue CPAP auto 5-20

## 2017-08-19 ENCOUNTER — Ambulatory Visit (HOSPITAL_COMMUNITY): Payer: 59 | Admitting: Certified Registered Nurse Anesthetist

## 2017-08-19 ENCOUNTER — Encounter (HOSPITAL_COMMUNITY)
Admission: RE | Disposition: A | Discharge status: Home / Self Care | Payer: Self-pay | Source: Ambulatory Visit | Attending: Urology

## 2017-08-19 ENCOUNTER — Encounter (HOSPITAL_COMMUNITY): Payer: Self-pay | Admitting: *Deleted

## 2017-08-19 ENCOUNTER — Other Ambulatory Visit: Payer: Self-pay | Admitting: Urology

## 2017-08-19 ENCOUNTER — Ambulatory Visit (HOSPITAL_COMMUNITY): Payer: 59

## 2017-08-19 ENCOUNTER — Ambulatory Visit (HOSPITAL_COMMUNITY)
Admission: RE | Admit: 2017-08-19 | Discharge: 2017-08-19 | Disposition: A | Discharge status: Home / Self Care | Payer: 59 | Source: Ambulatory Visit | Attending: Urology | Admitting: Urology

## 2017-08-19 DIAGNOSIS — Z7984 Long term (current) use of oral hypoglycemic drugs: Secondary | ICD-10-CM | POA: Insufficient documentation

## 2017-08-19 DIAGNOSIS — E119 Type 2 diabetes mellitus without complications: Secondary | ICD-10-CM | POA: Insufficient documentation

## 2017-08-19 DIAGNOSIS — N201 Calculus of ureter: Secondary | ICD-10-CM | POA: Diagnosis not present

## 2017-08-19 DIAGNOSIS — K219 Gastro-esophageal reflux disease without esophagitis: Secondary | ICD-10-CM | POA: Diagnosis not present

## 2017-08-19 DIAGNOSIS — Z79899 Other long term (current) drug therapy: Secondary | ICD-10-CM | POA: Diagnosis not present

## 2017-08-19 DIAGNOSIS — I10 Essential (primary) hypertension: Secondary | ICD-10-CM | POA: Insufficient documentation

## 2017-08-19 DIAGNOSIS — N202 Calculus of kidney with calculus of ureter: Secondary | ICD-10-CM | POA: Diagnosis not present

## 2017-08-19 DIAGNOSIS — G4733 Obstructive sleep apnea (adult) (pediatric): Secondary | ICD-10-CM | POA: Diagnosis not present

## 2017-08-19 DIAGNOSIS — M109 Gout, unspecified: Secondary | ICD-10-CM | POA: Diagnosis not present

## 2017-08-19 DIAGNOSIS — N400 Enlarged prostate without lower urinary tract symptoms: Secondary | ICD-10-CM | POA: Diagnosis not present

## 2017-08-19 DIAGNOSIS — E785 Hyperlipidemia, unspecified: Secondary | ICD-10-CM | POA: Diagnosis not present

## 2017-08-19 HISTORY — PX: CYSTOSCOPY/URETEROSCOPY/HOLMIUM LASER/STENT PLACEMENT: SHX6546

## 2017-08-19 LAB — BASIC METABOLIC PANEL
ANION GAP: 12 (ref 5–15)
BUN: 25 mg/dL — AB (ref 8–23)
CHLORIDE: 110 mmol/L (ref 98–111)
CO2: 22 mmol/L (ref 22–32)
Calcium: 10 mg/dL (ref 8.9–10.3)
Creatinine, Ser: 1.66 mg/dL — ABNORMAL HIGH (ref 0.61–1.24)
GFR calc non Af Amer: 43 mL/min — ABNORMAL LOW (ref >60)
GFR, EST AFRICAN AMERICAN: 50 mL/min — AB (ref >60)
Glucose, Bld: 165 mg/dL — ABNORMAL HIGH (ref 70–99)
POTASSIUM: 4 mmol/L (ref 3.5–5.1)
SODIUM: 144 mmol/L (ref 135–145)

## 2017-08-19 LAB — GLUCOSE, CAPILLARY: Glucose-Capillary: 145 mg/dL — ABNORMAL HIGH (ref 70–99)

## 2017-08-19 SURGERY — CYSTOSCOPY/URETEROSCOPY/HOLMIUM LASER/STENT PLACEMENT
Anesthesia: General | Laterality: Right

## 2017-08-19 MED ORDER — PROPOFOL 10 MG/ML IV BOLUS
INTRAVENOUS | Status: DC | PRN
  Administered 2017-08-19: 50 mg via INTRAVENOUS

## 2017-08-19 MED ORDER — FENTANYL CITRATE (PF) 100 MCG/2ML IJ SOLN: 25.0000 ug | INTRAMUSCULAR | Status: DC | PRN

## 2017-08-19 MED ORDER — PROMETHAZINE HCL 25 MG/ML IJ SOLN: 6.2500 mg | INTRAMUSCULAR | Status: DC | PRN

## 2017-08-19 MED ORDER — IOHEXOL 300 MG/ML  SOLN
INTRAMUSCULAR | Status: DC | PRN
  Administered 2017-08-19: 5 mL via URETHRAL

## 2017-08-19 MED ORDER — SODIUM CHLORIDE 0.9 % IR SOLN
Status: DC | PRN
  Administered 2017-08-19: 3000 mL via INTRAVESICAL

## 2017-08-19 MED ORDER — LIDOCAINE 2% (20 MG/ML) 5 ML SYRINGE
INTRAMUSCULAR | Status: AC
  Filled 2017-08-19: qty 5

## 2017-08-19 MED ORDER — FENTANYL CITRATE (PF) 100 MCG/2ML IJ SOLN
INTRAMUSCULAR | Status: AC
  Filled 2017-08-19: qty 2

## 2017-08-19 MED ORDER — DEXAMETHASONE SODIUM PHOSPHATE 10 MG/ML IJ SOLN
INTRAMUSCULAR | Status: AC
  Filled 2017-08-19: qty 1

## 2017-08-19 MED ORDER — ONDANSETRON HCL 4 MG/2ML IJ SOLN
INTRAMUSCULAR | Status: AC
  Filled 2017-08-19: qty 2

## 2017-08-19 MED ORDER — DEXAMETHASONE SODIUM PHOSPHATE 4 MG/ML IJ SOLN
INTRAMUSCULAR | Status: DC | PRN
  Administered 2017-08-19: 10 mg via INTRAVENOUS

## 2017-08-19 MED ORDER — LIDOCAINE 2% (20 MG/ML) 5 ML SYRINGE
INTRAMUSCULAR | Status: DC | PRN
  Administered 2017-08-19: 60 mg via INTRAVENOUS

## 2017-08-19 MED ORDER — LACTATED RINGERS IV SOLN
INTRAVENOUS | Status: DC | PRN
  Administered 2017-08-19: 16:00:00 via INTRAVENOUS

## 2017-08-19 MED ORDER — CEFAZOLIN SODIUM-DEXTROSE 2-4 GM/100ML-% IV SOLN
2.0000 g | Freq: Once | INTRAVENOUS | Status: AC
  Administered 2017-08-19: 2 g via INTRAVENOUS
  Filled 2017-08-19: qty 100

## 2017-08-19 MED ORDER — KETOROLAC TROMETHAMINE 30 MG/ML IJ SOLN: 30.0000 mg | Freq: Once | INTRAMUSCULAR | Status: DC | PRN

## 2017-08-19 MED ORDER — FENTANYL CITRATE (PF) 100 MCG/2ML IJ SOLN
INTRAMUSCULAR | Status: DC | PRN
  Administered 2017-08-19 (×2): 25 ug via INTRAVENOUS
  Administered 2017-08-19: 50 ug via INTRAVENOUS

## 2017-08-19 MED ORDER — MIDAZOLAM HCL 2 MG/2ML IJ SOLN
INTRAMUSCULAR | Status: AC
  Filled 2017-08-19: qty 2

## 2017-08-19 MED ORDER — MIDAZOLAM HCL 2 MG/2ML IJ SOLN
INTRAMUSCULAR | Status: DC | PRN
  Administered 2017-08-19: 2 mg via INTRAVENOUS

## 2017-08-19 MED ORDER — PROPOFOL 10 MG/ML IV BOLUS
INTRAVENOUS | Status: AC
  Filled 2017-08-19: qty 40

## 2017-08-19 MED ORDER — ONDANSETRON HCL 4 MG/2ML IJ SOLN
INTRAMUSCULAR | Status: DC | PRN
  Administered 2017-08-19: 4 mg via INTRAVENOUS

## 2017-08-19 MED FILL — ONDANSETRON HCL 4 MG TABLET: 4 | 6 days supply | Qty: 20 | Fill #0

## 2017-08-19 MED FILL — HYDROCODON-APAP 5-325: 5-325 | 2 days supply | Qty: 20 | Fill #0

## 2017-08-19 SURGICAL SUPPLY — 22 items
BAG URO CATCHER STRL LF (MISCELLANEOUS) ×3 IMPLANT
BASKET ZERO TIP NITINOL 2.4FR (BASKET) IMPLANT
BSKT STON RTRVL ZERO TP 2.4FR (BASKET)
CATH INTERMIT  6FR 70CM (CATHETERS) ×3 IMPLANT
CLOTH BEACON ORANGE TIMEOUT ST (SAFETY) IMPLANT
EXTRACTOR STONE NITINOL NGAGE (UROLOGICAL SUPPLIES) ×3 IMPLANT
FIBER LASER FLEXIVA 365 (UROLOGICAL SUPPLIES) IMPLANT
FIBER LASER TRAC TIP (UROLOGICAL SUPPLIES) IMPLANT
GLOVE BIOGEL M STRL SZ7.5 (GLOVE) ×9 IMPLANT
GOWN STRL REUS W/TWL LRG LVL3 (GOWN DISPOSABLE) ×6 IMPLANT
GUIDEWIRE ANG ZIPWIRE 038X150 (WIRE) IMPLANT
GUIDEWIRE STR DUAL SENSOR (WIRE) ×3 IMPLANT
GUIDEWIRE ZIPWRE .038 STRAIGHT (WIRE) ×3 IMPLANT
IV NS 1000ML (IV SOLUTION)
IV NS 1000ML BAXH (IV SOLUTION) IMPLANT
MANIFOLD NEPTUNE II (INSTRUMENTS) ×3 IMPLANT
PACK CYSTO (CUSTOM PROCEDURE TRAY) ×3 IMPLANT
SHEATH URETERAL 12FRX35CM (MISCELLANEOUS) IMPLANT
STENT CONTOUR 6FRX26X.038 (STENTS) ×3 IMPLANT
TUBING CONNECTING 10 (TUBING) ×2 IMPLANT
TUBING CONNECTING 10' (TUBING) ×1
TUBING UROLOGY SET (TUBING) ×3 IMPLANT

## 2017-08-19 NOTE — Anesthesia Postprocedure Evaluation (Signed)
Anesthesia Post Note  Patient: Christopher Stewart  Procedure(s) Performed: CYSTOSCOPY/RIGHT RETROGRADE RIGHT URETEROSCOPY/ RIGHT URETERAL STENT PLACEMENT (Right )     Patient location during evaluation: PACU Anesthesia Type: General Level of consciousness: awake and alert Pain management: pain level controlled Vital Signs Assessment: post-procedure vital signs reviewed and stable Respiratory status: spontaneous breathing, nonlabored ventilation, respiratory function stable and patient connected to nasal cannula oxygen Cardiovascular status: blood pressure returned to baseline and stable Postop Assessment: no apparent nausea or vomiting Anesthetic complications: no    Last Vitals:  Vitals:   08/19/17 1730 08/19/17 1745  BP: (!) 154/87 (!) 149/82  Pulse: 72 71  Resp: 13 14  Temp:  37.1 C  SpO2: 98% 95%    Last Pain:  Vitals:   08/19/17 1717  TempSrc:   PainSc: Asleep                 Khyleigh Furney S

## 2017-08-19 NOTE — Anesthesia Preprocedure Evaluation (Signed)
Anesthesia Evaluation  Patient identified by MRN, date of birth, ID band Patient awake    Reviewed: Allergy & Precautions, NPO status , Patient's Chart, lab work & pertinent test results  Airway Mallampati: II  TM Distance: >3 FB Neck ROM: Full    Dental no notable dental hx.    Pulmonary neg pulmonary ROS, sleep apnea and Continuous Positive Airway Pressure Ventilation ,    Pulmonary exam normal breath sounds clear to auscultation       Cardiovascular hypertension, Normal cardiovascular exam Rhythm:Regular Rate:Normal     Neuro/Psych negative neurological ROS  negative psych ROS   GI/Hepatic Neg liver ROS, GERD  ,  Endo/Other  diabetes  Renal/GU negative Renal ROS  negative genitourinary   Musculoskeletal negative musculoskeletal ROS (+)   Abdominal   Peds negative pediatric ROS (+)  Hematology negative hematology ROS (+)   Anesthesia Other Findings   Reproductive/Obstetrics negative OB ROS                             Anesthesia Physical Anesthesia Plan  ASA: III  Anesthesia Plan: General   Post-op Pain Management:    Induction: Intravenous  PONV Risk Score and Plan: 2 and Ondansetron, Dexamethasone and Treatment may vary due to age or medical condition  Airway Management Planned: LMA and Oral ETT  Additional Equipment:   Intra-op Plan:   Post-operative Plan: Extubation in OR  Informed Consent: I have reviewed the patients History and Physical, chart, labs and discussed the procedure including the risks, benefits and alternatives for the proposed anesthesia with the patient or authorized representative who has indicated his/her understanding and acceptance.   Dental advisory given  Plan Discussed with: CRNA and Surgeon  Anesthesia Plan Comments:         Anesthesia Quick Evaluation

## 2017-08-19 NOTE — Discharge Instructions (Signed)

## 2017-08-19 NOTE — Op Note (Signed)
Preoperative diagnosis: Right ureteral calculus  Postoperative diagnosis: Right ureteral calculus  Procedure:  1. Cystoscopy 2. Right ureteroscopy and stone removal 3. Right ureteral stent placement (6 x 26) 4. Right retrograde pyelography with interpretation  Surgeon: Pryor Curia. M.D.  Anesthesia: General  Complications: None  Intraoperative findings: Right retrograde pyelography demonstrated a filling defect within the distal right ureter consistent with the patient's known calculus without other abnormalities.  EBL: Minimal  Specimens: 1. Right ureteral calculus  Disposition of specimens: [facility] for stone analysis  Indication: Christopher Stewart is a 62 y.o. year old patient with a symptomatic right ureteral stone. After reviewing the management options for treatment, the patient elected to proceed with the above surgical procedure(s). We have discussed the potential benefits and risks of the procedure, side effects of the proposed treatment, the likelihood of the patient achieving the goals of the procedure, and any potential problems that might occur during the procedure or recuperation. Informed consent has been obtained.  Description of procedure:  The patient was taken to the operating room and general anesthesia was induced.  The patient was placed in the dorsal lithotomy position, prepped and draped in the usual sterile fashion, and preoperative antibiotics were administered. A preoperative time-out was performed.   Cystourethroscopy was performed.  The patient's urethra was examined and was normal except for a mild bulbar stricture that was not clinically significant and easily allowed for passage of the 22 Fr cystoscope. The bladder was then systematically examined in its entirety. There was no evidence for any bladder tumors, stones, or other mucosal pathology.    Attention then turned to the right ureteral orifice and a ureteral catheter  was used to intubate the ureteral orifice.  Omnipaque contrast was injected through the ureteral catheter and a retrograde pyelogram was performed with findings as dictated above.  A 0.38 sensor guidewire was then advanced up the right ureter into the renal pelvis under fluoroscopic guidance. The 6 Fr semirigid ureteroscope was then attempted to be advanced into the ureter next to the guidewire but the ureteral orifice was of small caliber and would not allow the ureteroscope to pass easily.  I then placed a 0.38 straight glidewire into the ureter which opened the ureteral orifice and allowed the scope to pass into the distal ureter.  There appeared to be a mild stricture in the distal ureter below the level of the stone that would not safely allow the 6 Fr ureteroscope to pass.  I therefore removed the 6 Fr ureteroscope and used the 4.5 Fr ureteroscope and was able to bypass the stricture.  The stone was identified and was removed intact with an N gauge basket.  Reinspection of the ureter revealed no remaining visible stones or fragments.   The wire was then backloaded through the cystoscope and a ureteral stent was advance over the wire using Seldinger technique.  The stent was positioned appropriately under fluoroscopic and cystoscopic guidance.  The wire was then removed with an adequate stent curl noted in the renal pelvis as well as in the bladder. It was decided to leave the stent without a string due to the stricture to allow it to remain indwelling for about one week.  The bladder was then emptied and the procedure ended.  The patient appeared to tolerate the procedure well and without complications.  The patient was able to be awakened and transferred to the recovery unit in satisfactory condition.

## 2017-08-19 NOTE — H&P (Signed)
Office Visit Report     08/19/2017   --------------------------------------------------------------------------------   Christopher Stewart  MRN: [id number]  PRIMARY CARE:  Jeannette How. Perini, MD  DOB: 1955-05-02, 62 year old Male  REFERRING:  Deborra Medina, MD  SSN: -[id number]  PROVIDER:  Raynelle Bring, M.D.    LOCATION:  [facility] 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 His last imaging was in 2013. He developed the acute onset of severe right-sided lower quadrant pain with radiation into his right testis. This was associated with severe nausea and vomiting. He has not had objective fever. He denies hematuria. This does feel similar to his prior kidney stone episodes.     ALLERGIES: Horse Derived Products Thimerosal     MEDICATIONS: Terazosin Hcl 5 mg capsule TAKE 1 CAPSULE EVERY DAY  Allopurinol TABS 0 Oral  Crestor TABS 0 Oral  Hydrocodone-Acetaminophen 5 mg-500 mg tablet 0 Oral  Hydrocodone-Acetaminophen 5-325 MG Oral Tablet Oral Every 4 hours  Lisinopril 20 MG Oral Tablet Oral  Lunesta 2 MG Oral Tablet Oral  MetFORMIN HCl - 1000 MG Oral Tablet 0 Oral  NexIUM 40 MG Oral Capsule Delayed Release Oral  Norvasc TABS 0 Oral  Terazosin Hcl 5 mg capsule 0 Oral     GU PSH: Cystoscopy Insert Stent - 2010 ESWL - 2010, 2010 Ureteroscopic stone removal - 2010      PSH Notes: Lithotripsy, Cystoscopy With Ureteroscopy With Removal Of Calculus, Cystoscopy With Insertion Of Ureteral Stent Left, Lithotripsy   NON-GU PSH: None   GU PMH: BPH w/LUTS, Benign localized prostatic hyperplasia with lower urinary tract symptoms (LUTS) - 2014 History of urolithiasis, Nephrolithiasis - 2014 Renal calculus, Nephrolithiasis -  2014 Ureteral calculus, Calculus of left ureter - 2014      PMH Notes:  2008-09-06 13:28:57 - Note: Gout   NON-GU PMH: Personal history of other diseases of the circulatory system, History of hypertension - 2014 Personal history of other endocrine, nutritional and metabolic disease, History of diabetes mellitus - 2014 Personal history of other specified conditions, History of heartburn - 2014 Encounter for general adult medical examination without abnormal findings, Encounter for preventive health examination    FAMILY HISTORY: Diabetes - Mother Family Health Status Number - No Family History Father Deceased At Age87 ___ - Runs In Family Hypertension - Mother, Father Mother Deceased At Age 35 from diabetic complicati - Runs In Family pneumonia - Mother Stroke Syndrome - Father   SOCIAL HISTORY: None    Notes: Never Smoked, Occupation:, Caffeine Use, Alcohol Use, Marital History - Currently Married   REVIEW OF SYSTEMS:    GU Review Male:   Patient denies frequent urination, hard to postpone urination, burning/ pain with urination, get up at night to urinate, leakage of urine, stream starts and stops, trouble starting your streams, and have to strain to urinate .  Gastrointestinal (Lower):   Patient denies diarrhea and constipation.  Gastrointestinal (Upper):   Patient denies nausea and vomiting.  Constitutional:   Patient denies fever, night sweats, weight loss, and fatigue.  Skin:   Patient denies skin rash/ lesion and itching.  Eyes:   Patient denies blurred vision and double vision.  Ears/ Nose/ Throat:   Patient denies sore throat and sinus problems.  Hematologic/Lymphatic:   Patient denies swollen glands and easy  bruising.  Cardiovascular:   Patient denies leg swelling and chest pains.  Respiratory:   Patient denies cough and shortness of breath.  Endocrine:   Patient denies excessive thirst.  Musculoskeletal:   Patient denies back pain and joint pain.  Neurological:    Patient denies headaches and dizziness.  Psychologic:   Patient denies depression and anxiety.   VITAL SIGNS:      08/19/2017 08:58 AM  Weight 303 lb / 137.44 kg  Height 70 in / 177.8 cm  BP 145/83 mmHg  Pulse 67 /min  Temperature 98.0 F / 36.6 C  BMI 43.5 kg/m   MULTI-SYSTEM PHYSICAL EXAMINATION:    Constitutional: Well-nourished. No physical deformities. Normally developed. Good grooming.  Neck: Neck symmetrical, not swollen. Normal tracheal position.  Respiratory: No labored breathing, no use of accessory muscles. Clear bilaterally.  Cardiovascular: Normal temperature, normal extremity pulses, no swelling, no varicosities. Regular rate and rhythm.  Lymphatic: No enlargement of neck, axillae, groin.  Skin: No paleness, no jaundice, no cyanosis. No lesion, no ulcer, no rash.  Neurologic / Psychiatric: Oriented to time, oriented to place, oriented to person. No depression, no anxiety, no agitation.  Gastrointestinal: Moderate right CVA tenderness and right lower quadrant tenderness. No rebound tenderness or guarding.  Eyes: Normal conjunctivae. Normal eyelids.  Ears, Nose, Mouth, and Throat: Left ear no scars, no lesions, no masses. Right ear no scars, no lesions, no masses. Nose no scars, no lesions, no masses. Normal hearing. Normal lips.  Musculoskeletal: Normal gait and station of head and neck.     PAST DATA REVIEWED:  Source Of History:  Patient  Urine Test Review:   Urinalysis  X-Ray Review: C.T. Stone Protocol: Reviewed Films.    Notes:                     I independently reviewed his CT scan. This demonstrates a small distal right ureteral calculus measuring approximately 2 mm. He has a no other nonobstructing calculus in the right renal pelvis as well as a small calculus in the left kidney that is nonobstructing.   PROCEDURES:         C.T. Urogram - [id number]               Urinalysis Dipstick Dipstick Cont'd  Color: Yellow Bilirubin: Neg mg/dL  Appearance: Clear  Ketones: Neg mg/dL  Specific Gravity: 1.020 Blood: Neg ery/uL  pH: <=5.0 Protein: Neg mg/dL  Glucose: 3+ mg/dL Urobilinogen: 0.2 mg/dL    Nitrites: Neg    Leukocyte Esterase: Neg leu/uL         Ketoralac 30mg  - J1885, [id number] Qty: 30 Adm. By: Cristobal Goldmann  Unit: mg Lot No [id number]  Route: IM Exp. Date 10/14/2018  Freq: None Mfgr.:   Site: Left Buttock         Phenergan 25mg  - J2550, [id number] Qty: 25 Adm. By: Cristobal Goldmann  Unit: mg Lot No [id number]  Route: IM Exp. Date 02/13/2018  Freq: None Mfgr.:   Site: Right Buttock   ASSESSMENT:      ICD-10 Details  1 GU:   Ureteral calculus - N20.1    PLAN:            Medications New Meds: Hydrocodone-Acetaminophen 5 mg-325 mg tablet 1-2 tablet PO Q 6 H prn   #20  0 Refill(s)  Tamsulosin Hcl 0.4 mg capsule 1 capsule PO Q HS   #14  0 Refill(s)  Zofran 4 mg tablet 1 tablet PO Q 8  H   #20  0 Refill(s)            Orders X-Rays: C.T. Stone Protocol Without Contrast          Schedule Return Visit/Planned Activity: Other See Visit Notes             Note: To be scheduled for surgery.          Document Letter(s):  Created for Patient: Clinical Summary         Notes:   1. Right ureteral calculus: We reviewed options today including medical expulsion therapy versus intervention with ureteroscopy and stone removal versus laser lithotripsy. Due to his need to be functional both to care for his wife and for work, he prefers to proceed with intervention and would like to undergo right ureteroscopy with stone removal today. We reviewed this procedure in detail including the potential risks, benefits, potential complications, and the expected recovery process. He gives informed consent to proceed.   Cc: Dr. Deborra Medina

## 2017-08-19 NOTE — Transfer of Care (Signed)
Immediate Anesthesia Transfer of Care Note  Patient: Christopher Stewart  Procedure(s) Performed: CYSTOSCOPY/RIGHT RETROGRADE RIGHT URETEROSCOPY/ RIGHT URETERAL STENT PLACEMENT (Right )  Patient Location: PACU  Anesthesia Type:General  Level of Consciousness: awake, alert  and patient cooperative  Airway & Oxygen Therapy: Patient Spontanous Breathing and Patient connected to nasal cannula oxygen  Post-op Assessment: Report given to RN and Post -op Vital signs reviewed and stable  Post vital signs: Reviewed and stable  Last Vitals:  Vitals Value Taken Time  BP 133/79 08/19/2017  5:17 PM  Temp 37.1 C 08/19/2017  5:17 PM  Pulse 75 08/19/2017  5:19 PM  Resp 15 08/19/2017  5:19 PM  SpO2 96 % 08/19/2017  5:19 PM  Vitals shown include unvalidated device data.  Last Pain:  Vitals:   08/19/17 1522  TempSrc:   PainSc: 0-No pain         Complications: No apparent anesthesia complications

## 2017-08-19 NOTE — Anesthesia Procedure Notes (Signed)
Procedure Name: LMA Insertion Date/Time: 08/19/2017 4:29 PM Performed by: Claudia Desanctis, CRNA Pre-anesthesia Checklist: Emergency Drugs available, Patient identified, Suction available and Patient being monitored Patient Re-evaluated:Patient Re-evaluated prior to induction Oxygen Delivery Method: Circle system utilized Preoxygenation: Pre-oxygenation with 100% oxygen Induction Type: IV induction Ventilation: Mask ventilation without difficulty LMA: LMA inserted and LMA with gastric port inserted LMA Size: 5.0 Number of attempts: 1 Placement Confirmation: positive ETCO2 and breath sounds checked- equal and bilateral Tube secured with: Tape Dental Injury: Teeth and Oropharynx as per pre-operative assessment

## 2017-08-20 ENCOUNTER — Encounter (HOSPITAL_COMMUNITY): Payer: Self-pay | Admitting: Urology

## 2017-08-20 DIAGNOSIS — N201 Calculus of ureter: Secondary | ICD-10-CM | POA: Diagnosis not present

## 2017-08-24 ENCOUNTER — Other Ambulatory Visit: Payer: Self-pay | Admitting: Internal Medicine

## 2017-08-24 MED FILL — AMLODIPINE BESYLATE 5 MG TA: 5 | 90 days supply | Qty: 90 | Fill #2

## 2017-08-24 MED FILL — ESZOPICLONE 3 MG TABS: 3 | 30 days supply | Qty: 30 | Fill #3

## 2017-08-25 MED FILL — ROSUVASTATIN CALCIUM 10 MG: 10 | 90 days supply | Qty: 45 | Fill #0

## 2017-08-26 DIAGNOSIS — N201 Calculus of ureter: Secondary | ICD-10-CM | POA: Diagnosis not present

## 2017-09-08 MED FILL — JANUVIA 100 MG TABLET: 100 | 30 days supply | Qty: 30 | Fill #2

## 2017-09-15 MED FILL — ALLOPURINOL 300 MG TAB: 300 | 90 days supply | Qty: 90 | Fill #0

## 2017-09-15 MED FILL — LISINOPRIL 40 MG TABLET: 40 | 90 days supply | Qty: 90 | Fill #1

## 2017-09-22 DIAGNOSIS — N201 Calculus of ureter: Secondary | ICD-10-CM | POA: Diagnosis not present

## 2017-09-24 MED FILL — ESZOPICLONE 3 MG TABS: 3 | 30 days supply | Qty: 30 | Fill #4

## 2017-09-24 MED FILL — JARDIANCE 25 MG TABLET: 25 | 90 days supply | Qty: 90 | Fill #1

## 2017-09-24 MED FILL — PANTOPRAZOLE SOD DR 40 MG T: 40 | 90 days supply | Qty: 90 | Fill #0

## 2017-09-24 MED FILL — metFORMIN HCL 1000 MG TABS: 1000 | 90 days supply | Qty: 180 | Fill #0

## 2017-10-06 MED FILL — JANUVIA 100 MG TABLET: 100 | 30 days supply | Qty: 30 | Fill #3

## 2017-10-21 MED FILL — TERAZOSIN 5 MG CAPSULE: 5 | 90 days supply | Qty: 90 | Fill #2

## 2017-10-22 MED FILL — ESZOPICLONE 3 MG TABS: 3 | 30 days supply | Qty: 30 | Fill #5

## 2017-10-23 ENCOUNTER — Ambulatory Visit: Payer: 59 | Admitting: Internal Medicine

## 2017-10-23 ENCOUNTER — Encounter: Payer: Self-pay | Admitting: Internal Medicine

## 2017-10-23 VITALS — BP 130/74 | HR 75 | Temp 98.1°F | Resp 15 | Ht 70.0 in | Wt 302.8 lb

## 2017-10-23 DIAGNOSIS — E669 Obesity, unspecified: Secondary | ICD-10-CM | POA: Diagnosis not present

## 2017-10-23 DIAGNOSIS — E119 Type 2 diabetes mellitus without complications: Secondary | ICD-10-CM

## 2017-10-23 DIAGNOSIS — N201 Calculus of ureter: Secondary | ICD-10-CM

## 2017-10-23 DIAGNOSIS — E785 Hyperlipidemia, unspecified: Secondary | ICD-10-CM

## 2017-10-23 DIAGNOSIS — E1159 Type 2 diabetes mellitus with other circulatory complications: Secondary | ICD-10-CM

## 2017-10-23 DIAGNOSIS — E1169 Type 2 diabetes mellitus with other specified complication: Secondary | ICD-10-CM | POA: Diagnosis not present

## 2017-10-23 DIAGNOSIS — I10 Essential (primary) hypertension: Secondary | ICD-10-CM

## 2017-10-23 MED ORDER — HYDROCODONE-ACETAMINOPHEN 10-325 MG PO TABS: 1.0000 | ORAL_TABLET | Freq: Four times a day (QID) | ORAL | 0 refills | Status: DC | PRN

## 2017-10-23 MED FILL — HYDROCODON-APAP 10-325: 10-325 | 7 days supply | Qty: 30 | Fill #0

## 2017-10-23 NOTE — Progress Notes (Signed)
Subjective:  Patient ID: Christopher Stewart, male    DOB: 01-08-1956  Age: 62 y.o. MRN: [id number]  CC: The primary encounter diagnosis was Hyperlipidemia LDL goal <100. Diagnoses of Obesity, diabetes, and hypertension syndrome ([place]), Essential hypertension, and Right ureteral stone were also pertinent to this visit.  HPI LAKIN ROMER presents for 6 month follow up  On type 2 DM  Has cystoscopy  With  Right retrograde ureteroscopy and right ureteral stent placememtn on Aug 7  .  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 Taking potassium citrate.  Sugars have been elevated despite increasing dose of Januvia to 100 mg dose  .  Tried byetta years ago did not tolerate it due to nausea.  Not exercising.  fastiing have been 150 to 160   Has not checked post prandial.  He is not interested in starting insulin unless it Is a weekly injection .   Outpatient Medications Prior to Visit  Medication Sig Dispense Refill  . acetaminophen (TYLENOL) 500 MG tablet Take 1,000 mg by mouth every 6 (six) hours as needed for mild pain.     Marland Kitchen allopurinol (ZYLOPRIM) 300 MG tablet TAKE 1 TABLET BY MOUTH ONCE DAILY 90 tablet 1  . amLODipine (NORVASC) 5 MG tablet TAKE 1 TABLET BY MOUTH ONCE DAILY (Patient taking differently: Take 5 mg by mouth daily. ) 90 tablet 2  . aspirin 81 MG tablet Take 81 mg by mouth every other day.     . celecoxib (CELEBREX) 200 MG capsule Take 1 capsule (200 mg total) by mouth 2 (two) times daily. 180 capsule 1  . Eszopiclone 3 MG TABS TAKE 1 TABLET BY MOUTH AT BEDTIME AS NEEDED FOR SLEEP (Patient taking differently: Take 3 mg by mouth at bedtime as needed (sleep). ) 30 tablet 5  . glucose blood test strip Use to check blood sugar once daily 100 each 2  . JARDIANCE 25 MG TABS tablet TAKE 1 TABLET BY MOUTH ONCE DAILY (Patient taking differently: Take 25 mg by mouth daily. ) 30 tablet 5  . Lancets  (FREESTYLE) lancets Use to check blood sugar once daily 100 each 2  . lisinopril (PRINIVIL,ZESTRIL) 40 MG tablet TAKE 1 TABLET BY MOUTH ONCE DAILY (Patient taking differently: Take 40 mg by mouth daily. ) 90 tablet 1  . metFORMIN (GLUCOPHAGE) 1000 MG tablet TAKE 1 TABLET BY MOUTH TWICE A DAY WITH A MEAL 180 tablet 1  . pantoprazole (PROTONIX) 40 MG tablet TAKE 1 TABLET BY MOUTH ONCE DAILY 90 tablet 1  . rosuvastatin (CRESTOR) 10 MG tablet TAKE 1 TABLET BY MOUTH EVERY OTHER DAY. 45 tablet 1  . sitaGLIPtin (JANUVIA) 100 MG tablet Take 1 tablet (100 mg total) by mouth daily. 90 tablet 1  . terazosin (HYTRIN) 5 MG capsule Take 5 mg by mouth daily.     No facility-administered medications prior to visit.     Review of Systems;  Patient denies headache, fevers, malaise, unintentional weight loss, skin rash, eye pain, sinus congestion and sinus pain, sore throat, dysphagia,  hemoptysis , cough, dyspnea, wheezing, chest pain, palpitations, orthopnea, edema, abdominal pain, nausea, melena, diarrhea, constipation, flank pain, dysuria, hematuria, urinary  Frequency, nocturia, numbness, tingling, seizures,  Focal weakness, Loss of consciousness,  Tremor, insomnia, depression, anxiety, and suicidal ideation.      Objective:  BP 130/74 (BP Location: Left Arm, Patient Position: Sitting, Cuff  Size: Large)   Pulse 75   Temp 98.1 F (36.7 C) (Oral)   Resp 15   Ht 5\' 10"  (1.778 m)   Wt (!) 302 lb 12.8 oz (137.3 kg)   SpO2 97%   BMI 43.45 kg/m   BP Readings from Last 3 Encounters:  10/23/17 130/74  08/19/17 (!) 149/77  08/13/17 124/76    Wt Readings from Last 3 Encounters:  10/23/17 (!) 302 lb 12.8 oz (137.3 kg)  08/19/17 (!) 304 lb (137.9 kg)  08/13/17 (!) 304 lb 6.4 oz (138.1 kg)    General appearance: alert, cooperative and appears stated age Ears: normal TM's and external ear canals both ears Throat: lips, mucosa, and tongue normal; teeth and gums normal Neck: no adenopathy, no carotid  bruit, supple, symmetrical, trachea midline and thyroid not enlarged, symmetric, no tenderness/mass/nodules Back: symmetric, no curvature. ROM normal. No CVA tenderness. Lungs: clear to auscultation bilaterally Heart: regular rate and rhythm, S1, S2 normal, no murmur, click, rub or gallop Abdomen: soft, non-tender; bowel sounds normal; no masses,  no organomegaly Pulses: 2+ and symmetric Skin: Skin color, texture, turgor normal. No rashes or lesions Lymph nodes: Cervical, supraclavicular, and axillary nodes normal.  Lab Results  Component Value Date   HGBA1C 8.5 (H) 10/23/2017   HGBA1C 8.1 (H) 04/10/2017   HGBA1C 7.8 (H) 10/20/2016    Lab Results  Component Value Date   CREATININE 1.20 10/23/2017   CREATININE 1.66 (H) 08/19/2017   CREATININE 1.09 04/10/2017    Lab Results  Component Value Date   WBC 7.3 11/27/2015   HGB 13.7 11/27/2015   HCT 42 11/27/2015   PLT 284 11/27/2015   GLUCOSE 169 (H) 10/23/2017   CHOL 132 10/23/2017   TRIG 196 (H) 10/23/2017   HDL 26 (L) 10/23/2017   LDLDIRECT 87.0 11/19/2015   LDLCALC 77 10/23/2017   ALT 51 (H) 10/23/2017   AST 30 10/23/2017   NA 142 10/23/2017   K 4.3 10/23/2017   CL 107 10/23/2017   CREATININE 1.20 10/23/2017   BUN 24 10/23/2017   CO2 23 10/23/2017   TSH 1.62 10/23/2017   PSA 0.31 04/10/2017   HGBA1C 8.5 (H) 10/23/2017   MICROALBUR 0.7 04/10/2017     Assessment & Plan:   Problem List Items Addressed This Visit    Essential hypertension    Well controlled on current regimen. Renal function stable, no changes today.  Lab Results  Component Value Date   CREATININE 1.20 10/23/2017   Lab Results  Component Value Date   NA 142 10/23/2017   K 4.3 10/23/2017   CL 107 10/23/2017   CO2 23 10/23/2017         Hyperlipidemia LDL goal <100 - Primary    Managed with every other day crestor 10 mg .  No changes today  Lab Results  Component Value Date   CHOL 132 10/23/2017   HDL 26 (L) 10/23/2017   LDLCALC 77  10/23/2017   LDLDIRECT 87.0 11/19/2015   TRIG 196 (H) 10/23/2017   CHOLHDL 5.1 (H) 10/23/2017   Lab Results  Component Value Date   ALT 51 (H) 10/23/2017   AST 30 10/23/2017   ALKPHOS 58 04/10/2017   BILITOT 0.4 10/23/2017         Relevant Orders   Comprehensive metabolic panel (Completed)   Hemoglobin A1c (Completed)   Lipid panel (Completed)   TSH (Completed)   Obesity, diabetes, and hypertension syndrome (HCC)    Progressive loss of control over the  last year  Despite maximal doses of  Januvia, jardiance and metformin . Fasting sugars are reported as being 150 to 160.  No post prandials for review.  Will consider adding glipizide or starting Trulicity  Or Ozempic given past intolerance of Byetta   Lab Results  Component Value Date   HGBA1C 8.5 (H) 10/23/2017         Right ureteral stone    S/p ureteroscopy and stent in august for stone.       Relevant Medications   HYDROcodone-acetaminophen (NORCO) 10-325 MG tablet    A total of 25 minutes of face to face time was spent with patient more than half of which was spent in counselling about the above mentioned conditions  and coordination of care   I am having Lenn Cal start on HYDROcodone-acetaminophen. I am also having him maintain his aspirin, terazosin, acetaminophen, amLODipine, freestyle, glucose blood, Eszopiclone, celecoxib, lisinopril, JARDIANCE, sitaGLIPtin, rosuvastatin, allopurinol, pantoprazole, and metFORMIN.  Meds ordered this encounter  Medications  . HYDROcodone-acetaminophen (NORCO) 10-325 MG tablet    Sig: Take 1 tablet by mouth every 6 (six) hours as needed.    Dispense:  30 tablet    Refill:  0    There are no discontinued medications.  Follow-up: No follow-ups on file.   Crecencio Mc, MD

## 2017-10-23 NOTE — Patient Instructions (Addendum)
Check 2 hr post prandials  over the weekend .  I'll speak with Suzy Bouchard about Trulicity vs Ozempic or other weekly injections   Chris's odansetron has been sent

## 2017-10-24 LAB — COMPREHENSIVE METABOLIC PANEL
AG Ratio: 1.5 (calc) (ref 1.0–2.5)
ALBUMIN MSPROF: 4 g/dL (ref 3.6–5.1)
ALT: 51 U/L — AB (ref 9–46)
AST: 30 U/L (ref 10–35)
Alkaline phosphatase (APISO): 61 U/L (ref 40–115)
BILIRUBIN TOTAL: 0.4 mg/dL (ref 0.2–1.2)
BUN: 24 mg/dL (ref 7–25)
CO2: 23 mmol/L (ref 20–32)
CREATININE: 1.2 mg/dL (ref 0.70–1.25)
Calcium: 9.7 mg/dL (ref 8.6–10.3)
Chloride: 107 mmol/L (ref 98–110)
GLUCOSE: 169 mg/dL — AB (ref 65–99)
Globulin: 2.7 g/dL (calc) (ref 1.9–3.7)
POTASSIUM: 4.3 mmol/L (ref 3.5–5.3)
SODIUM: 142 mmol/L (ref 135–146)
TOTAL PROTEIN: 6.7 g/dL (ref 6.1–8.1)

## 2017-10-24 LAB — LIPID PANEL
CHOL/HDL RATIO: 5.1 (calc) — AB (ref <5.0)
Cholesterol: 132 mg/dL (ref <200)
HDL: 26 mg/dL — ABNORMAL LOW (ref >40)
LDL CHOLESTEROL (CALC): 77 mg/dL
Non-HDL Cholesterol (Calc): 106 mg/dL (calc) (ref <130)
Triglycerides: 196 mg/dL — ABNORMAL HIGH (ref <150)

## 2017-10-24 LAB — TSH: TSH: 1.62 m[IU]/L (ref 0.40–4.50)

## 2017-10-24 LAB — HEMOGLOBIN A1C
Hgb A1c MFr Bld: 8.5 % of total Hgb — ABNORMAL HIGH (ref <5.7)
Mean Plasma Glucose: 197 (calc)
eAG (mmol/L): 10.9 (calc)

## 2017-10-25 ENCOUNTER — Other Ambulatory Visit: Payer: Self-pay | Admitting: Internal Medicine

## 2017-10-25 DIAGNOSIS — N201 Calculus of ureter: Secondary | ICD-10-CM

## 2017-10-25 HISTORY — DX: Calculus of ureter: N20.1

## 2017-10-25 MED ORDER — DULAGLUTIDE 0.75 MG/0.5ML ~~LOC~~ SOAJ: SUBCUTANEOUS | 2 refills | Status: DC

## 2017-10-25 NOTE — Assessment & Plan Note (Signed)
Well controlled on current regimen. Renal function stable, no changes today.  Lab Results  Component Value Date   CREATININE 1.20 10/23/2017   Lab Results  Component Value Date   NA 142 10/23/2017   K 4.3 10/23/2017   CL 107 10/23/2017   CO2 23 10/23/2017

## 2017-10-25 NOTE — Assessment & Plan Note (Signed)
Managed with every other day crestor 10 mg .  No changes today  Lab Results  Component Value Date   CHOL 132 10/23/2017   HDL 26 (L) 10/23/2017   LDLCALC 77 10/23/2017   LDLDIRECT 87.0 11/19/2015   TRIG 196 (H) 10/23/2017   CHOLHDL 5.1 (H) 10/23/2017   Lab Results  Component Value Date   ALT 51 (H) 10/23/2017   AST 30 10/23/2017   ALKPHOS 58 04/10/2017   BILITOT 0.4 10/23/2017

## 2017-10-25 NOTE — Assessment & Plan Note (Signed)
S/p ureteroscopy and stent in august for stone.

## 2017-10-25 NOTE — Assessment & Plan Note (Signed)
Progressive loss of control over the last year  Despite maximal doses of  Januvia, jardiance and metformin . Fasting sugars are reported as being 150 to 160.  No post prandials for review.  Will consider adding glipizide or starting Trulicity  Or Ozempic given past intolerance of Byetta   Lab Results  Component Value Date   HGBA1C 8.5 (H) 10/23/2017

## 2017-10-26 MED FILL — TRULICITY 0.75 MG/0.5 ML PE: 0.75 | 28 days supply | Qty: 2 | Fill #0

## 2017-11-06 DIAGNOSIS — H10413 Chronic giant papillary conjunctivitis, bilateral: Secondary | ICD-10-CM | POA: Diagnosis not present

## 2017-11-06 DIAGNOSIS — H524 Presbyopia: Secondary | ICD-10-CM | POA: Diagnosis not present

## 2017-11-06 DIAGNOSIS — H04123 Dry eye syndrome of bilateral lacrimal glands: Secondary | ICD-10-CM | POA: Diagnosis not present

## 2017-11-09 DIAGNOSIS — G4733 Obstructive sleep apnea (adult) (pediatric): Secondary | ICD-10-CM | POA: Diagnosis not present

## 2017-11-16 ENCOUNTER — Other Ambulatory Visit: Payer: Self-pay | Admitting: Internal Medicine

## 2017-11-16 MED FILL — CELECOXIB 200 MG CAP: 200 | 90 days supply | Qty: 180 | Fill #0

## 2017-11-17 MED FILL — TRULICITY 0.75 MG/0.5 ML PE: 0.75 | 28 days supply | Qty: 2 | Fill #1

## 2017-11-18 ENCOUNTER — Ambulatory Visit: Payer: 59 | Admitting: Internal Medicine

## 2017-11-25 MED FILL — FREESTYLE LANCETS: 90 days supply | Qty: 100 | Fill #2

## 2017-11-30 ENCOUNTER — Other Ambulatory Visit: Payer: Self-pay | Admitting: Internal Medicine

## 2017-11-30 MED FILL — ROSUVASTATIN CALCIUM 10 MG: 10 | 90 days supply | Qty: 45 | Fill #1

## 2017-11-30 MED FILL — ESZOPICLONE 3 MG TABS: 3 | 30 days supply | Qty: 30 | Fill #0

## 2017-11-30 NOTE — Telephone Encounter (Signed)
CY Please advise on refill. Thanks.  

## 2017-11-30 NOTE — Telephone Encounter (Signed)
Ok to refill total 6 months 

## 2017-12-07 MED ORDER — FREESTYLE SYSTEM KIT: 1.0000 | PACK | 1 refills | Status: DC | PRN

## 2017-12-07 MED ORDER — AMLODIPINE BESYLATE 5 MG PO TABS: 5.0000 mg | ORAL_TABLET | Freq: Every day | ORAL | 1 refills | Status: DC

## 2017-12-07 MED ORDER — EMPAGLIFLOZIN 25 MG PO TABS: 25.0000 mg | ORAL_TABLET | Freq: Every day | ORAL | 1 refills | Status: DC

## 2017-12-07 MED ORDER — LISINOPRIL 40 MG PO TABS: 40.0000 mg | ORAL_TABLET | Freq: Every day | ORAL | 1 refills | Status: DC

## 2017-12-07 MED FILL — LISINOPRIL 40 MG TABLET: 40 | 90 days supply | Qty: 90 | Fill #0

## 2017-12-07 MED FILL — AMLODIPINE BESYLATE 5 MG TA: 5 | 90 days supply | Qty: 90 | Fill #0

## 2017-12-07 MED FILL — FREESTYLE LITE METER: 1 days supply | Qty: 1 | Fill #0

## 2017-12-07 MED FILL — JARDIANCE 25 MG TABLET: 25 | 90 days supply | Qty: 90 | Fill #0

## 2017-12-07 NOTE — Telephone Encounter (Signed)
Medications have been refilled ?

## 2017-12-16 MED FILL — ALLOPURINOL 300 MG TAB: 300 | 90 days supply | Qty: 90 | Fill #1

## 2017-12-16 MED FILL — PANTOPRAZOLE SOD DR 40 MG T: 40 | 90 days supply | Qty: 90 | Fill #1

## 2017-12-16 MED FILL — metFORMIN HCL 1000 MG TABS: 1000 | 90 days supply | Qty: 180 | Fill #1

## 2017-12-23 MED FILL — TRULICITY 0.75 MG/0.5 ML PE: 0.75 | 28 days supply | Qty: 2 | Fill #2

## 2017-12-28 MED FILL — ESZOPICLONE 3 MG TABS: 3 | 30 days supply | Qty: 30 | Fill #1

## 2018-01-14 DIAGNOSIS — E1159 Type 2 diabetes mellitus with other circulatory complications: Secondary | ICD-10-CM

## 2018-01-14 DIAGNOSIS — I10 Essential (primary) hypertension: Secondary | ICD-10-CM

## 2018-01-14 DIAGNOSIS — E785 Hyperlipidemia, unspecified: Secondary | ICD-10-CM

## 2018-01-14 DIAGNOSIS — E669 Obesity, unspecified: Secondary | ICD-10-CM

## 2018-01-14 DIAGNOSIS — E1169 Type 2 diabetes mellitus with other specified complication: Secondary | ICD-10-CM

## 2018-01-14 MED FILL — FREESTYLE LITE TEST STRIP: 90 days supply | Qty: 100 | Fill #1

## 2018-01-14 MED FILL — TERAZOSIN 5 MG CAPSULE: 5 | 90 days supply | Qty: 90 | Fill #3

## 2018-01-15 MED ORDER — DULAGLUTIDE 0.75 MG/0.5ML ~~LOC~~ SOAJ
SUBCUTANEOUS | 2 refills | Status: DC
Start: 2018-01-15 — End: 2018-03-17

## 2018-01-15 MED FILL — TRULICITY 0.75 MG/0.5 ML PE: 0.75 | 28 days supply | Qty: 2 | Fill #0

## 2018-01-28 MED FILL — ESZOPICLONE 3 MG TABS: 3 | 30 days supply | Qty: 30 | Fill #2

## 2018-02-15 MED FILL — TRULICITY 0.75 MG/0.5 ML PE: 0.75 | 28 days supply | Qty: 2 | Fill #1

## 2018-02-21 MED FILL — CELECOXIB 200 MG CAP: 200 | 90 days supply | Qty: 180 | Fill #1

## 2018-03-04 MED FILL — LISINOPRIL 40 MG TABLET: 40 | 90 days supply | Qty: 90 | Fill #1

## 2018-03-04 MED FILL — AMLODIPINE BESYLATE 5 MG TA: 5 | 90 days supply | Qty: 90 | Fill #1

## 2018-03-04 MED FILL — JARDIANCE 25 MG TABLET: 25 | 90 days supply | Qty: 90 | Fill #1

## 2018-03-04 MED FILL — ESZOPICLONE 3 MG TABS: 3 | 30 days supply | Qty: 30 | Fill #3

## 2018-03-12 ENCOUNTER — Other Ambulatory Visit: Payer: Self-pay | Admitting: Internal Medicine

## 2018-03-12 MED FILL — PANTOPRAZOLE SOD DR 40 MG T: 40 | 90 days supply | Qty: 90 | Fill #0

## 2018-03-12 MED FILL — ROSUVASTATIN CALCIUM 10 MG: 10 | 90 days supply | Qty: 45 | Fill #0

## 2018-03-12 MED FILL — ALLOPURINOL 300 MG TABS: 300 | 90 days supply | Qty: 90 | Fill #0

## 2018-03-15 ENCOUNTER — Other Ambulatory Visit (INDEPENDENT_AMBULATORY_CARE_PROVIDER_SITE_OTHER): Payer: 59

## 2018-03-15 DIAGNOSIS — I10 Essential (primary) hypertension: Secondary | ICD-10-CM

## 2018-03-15 DIAGNOSIS — E785 Hyperlipidemia, unspecified: Secondary | ICD-10-CM | POA: Diagnosis not present

## 2018-03-15 DIAGNOSIS — E669 Obesity, unspecified: Secondary | ICD-10-CM | POA: Diagnosis not present

## 2018-03-15 DIAGNOSIS — E1159 Type 2 diabetes mellitus with other circulatory complications: Secondary | ICD-10-CM

## 2018-03-15 DIAGNOSIS — E1169 Type 2 diabetes mellitus with other specified complication: Secondary | ICD-10-CM | POA: Diagnosis not present

## 2018-03-15 LAB — COMPREHENSIVE METABOLIC PANEL
ALT: 53 U/L (ref 0–53)
AST: 30 U/L (ref 0–37)
Albumin: 3.7 g/dL (ref 3.5–5.2)
Alkaline Phosphatase: 57 U/L (ref 39–117)
BILIRUBIN TOTAL: 0.4 mg/dL (ref 0.2–1.2)
BUN: 24 mg/dL — ABNORMAL HIGH (ref 6–23)
CALCIUM: 8.7 mg/dL (ref 8.4–10.5)
CO2: 26 meq/L (ref 19–32)
Chloride: 106 mEq/L (ref 96–112)
Creatinine, Ser: 1.19 mg/dL (ref 0.40–1.50)
GFR: 61.95 mL/min (ref >60.00)
GLUCOSE: 165 mg/dL — AB (ref 70–99)
Potassium: 4.1 mEq/L (ref 3.5–5.1)
Sodium: 142 mEq/L (ref 135–145)
Total Protein: 6.5 g/dL (ref 6.0–8.3)

## 2018-03-15 LAB — LIPID PANEL
CHOL/HDL RATIO: 4
Cholesterol: 81 mg/dL (ref 0–200)
HDL: 19.1 mg/dL — ABNORMAL LOW (ref >39.00)
LDL Cholesterol: 41 mg/dL (ref 0–99)
NonHDL: 61.73
TRIGLYCERIDES: 103 mg/dL (ref 0.0–149.0)
VLDL: 20.6 mg/dL (ref 0.0–40.0)

## 2018-03-15 LAB — HEMOGLOBIN A1C: Hgb A1c MFr Bld: 8.2 % — ABNORMAL HIGH (ref 4.6–6.5)

## 2018-03-17 ENCOUNTER — Other Ambulatory Visit: Payer: Self-pay | Admitting: Internal Medicine

## 2018-03-17 DIAGNOSIS — I10 Essential (primary) hypertension: Principal | ICD-10-CM

## 2018-03-17 DIAGNOSIS — E669 Obesity, unspecified: Secondary | ICD-10-CM

## 2018-03-17 DIAGNOSIS — E1169 Type 2 diabetes mellitus with other specified complication: Principal | ICD-10-CM

## 2018-03-17 DIAGNOSIS — E1159 Type 2 diabetes mellitus with other circulatory complications: Principal | ICD-10-CM

## 2018-03-17 MED ORDER — DULAGLUTIDE 1.5 MG/0.5ML ~~LOC~~ SOAJ: 0.5000 mL | SUBCUTANEOUS | 11 refills | Status: DC

## 2018-03-17 NOTE — Assessment & Plan Note (Signed)
A1c not at goal.  Increasing trulicity to 1.5 EF/2.0 ml weekly

## 2018-03-18 MED FILL — TRULICITY 1.5 MG/0.5 ML PEN: 1.5 | 28 days supply | Qty: 2 | Fill #0

## 2018-04-01 ENCOUNTER — Other Ambulatory Visit: Payer: Self-pay | Admitting: Internal Medicine

## 2018-04-01 MED FILL — metFORMIN HCL 1000 MG TABS: 1000 | 90 days supply | Qty: 180 | Fill #0

## 2018-04-01 MED FILL — FREESTYLE LANCETS: 90 days supply | Qty: 100 | Fill #0

## 2018-04-01 MED FILL — FREESTYLE LITE TEST STRIP: 90 days supply | Qty: 100 | Fill #2

## 2018-04-01 MED FILL — ESZOPICLONE 3 MG TABS: 3 | 30 days supply | Qty: 30 | Fill #4

## 2018-04-01 MED FILL — TERAZOSIN 5 MG CAPSULE: 5 | 90 days supply | Qty: 90 | Fill #0

## 2018-04-10 MED FILL — TRULICITY 1.5 MG/0.5 ML PEN: 1.5 | 28 days supply | Qty: 2 | Fill #1

## 2018-05-03 MED FILL — TRULICITY 1.5 MG/0.5 ML PEN: 1.5 | 28 days supply | Qty: 2 | Fill #2

## 2018-05-03 MED FILL — ESZOPICLONE 3 MG TABS: 3 | 30 days supply | Qty: 30 | Fill #5

## 2018-05-05 DIAGNOSIS — G4733 Obstructive sleep apnea (adult) (pediatric): Secondary | ICD-10-CM | POA: Diagnosis not present

## 2018-05-21 MED ORDER — CELECOXIB 200 MG PO CAPS: 200.0000 mg | ORAL_CAPSULE | Freq: Two times a day (BID) | ORAL | 1 refills | Status: DC

## 2018-05-21 MED FILL — CELECOXIB 200 MG CAPSULE: 200 | 90 days supply | Qty: 180 | Fill #0

## 2018-05-26 ENCOUNTER — Other Ambulatory Visit: Payer: Self-pay | Admitting: Internal Medicine

## 2018-05-27 MED FILL — JARDIANCE 25 MG TABLET: 25 | 90 days supply | Qty: 90 | Fill #0

## 2018-05-27 MED FILL — AMLODIPINE BESYLATE 5 MG TA: 5 | 90 days supply | Qty: 90 | Fill #0

## 2018-05-27 NOTE — Telephone Encounter (Signed)
Refill request for lisinopril.

## 2018-05-28 MED ORDER — TELMISARTAN 80 MG PO TABS: 80.0000 mg | ORAL_TABLET | Freq: Every day | ORAL | 0 refills | Status: DC

## 2018-05-28 MED FILL — TELMISARTAN 80 MG TABS: 80 | 30 days supply | Qty: 30 | Fill #0

## 2018-06-04 ENCOUNTER — Other Ambulatory Visit: Payer: Self-pay | Admitting: Internal Medicine

## 2018-06-04 MED FILL — TRULICITY 1.5 MG/0.5 ML PEN: 1.5 | 28 days supply | Qty: 2 | Fill #3

## 2018-06-04 MED FILL — PANTOPRAZOLE SOD DR 40 MG T: 40 | 90 days supply | Qty: 90 | Fill #1

## 2018-06-04 MED FILL — ALLOPURINOL 300 MG TAB: 300 | 90 days supply | Qty: 90 | Fill #1

## 2018-06-04 MED FILL — ESZOPICLONE 3 MG TABS: 3 | 30 days supply | Qty: 30 | Fill #0

## 2018-06-04 NOTE — Telephone Encounter (Signed)
Refill request received for Eszopiclone 61m tab. Med last filled for pt 11/30/17 #30 with 5RF.  Dr. YAnnamaria Boots please advise if you are okay with refilling med for pt. Thanks!  Allergies  Allergen Reactions  . Belviq [Lorcaserin Hcl] Other (See Comments)    Did not tolerate well.  . Byetta 10 Mcg Pen [Exenatide] Nausea Only  . Horse-Derived Products Hives  . Other     Thimerosal-in Contacts solution-swelling and redness of eyes  . Qsymia [Phentermine-Topiramate]     Heart palpitations     Current Outpatient Medications:  .  acetaminophen (TYLENOL) 500 MG tablet, Take 1,000 mg by mouth every 6 (six) hours as needed for mild pain. , Disp: , Rfl:  .  allopurinol (ZYLOPRIM) 300 MG tablet, TAKE 1 TABLET BY MOUTH ONCE DAILY, Disp: 90 tablet, Rfl: 1 .  amLODipine (NORVASC) 5 MG tablet, TAKE 1 TABLET BY MOUTH DAILY., Disp: 90 tablet, Rfl: 1 .  aspirin 81 MG tablet, Take 81 mg by mouth every other day. , Disp: , Rfl:  .  celecoxib (CELEBREX) 200 MG capsule, Take 1 capsule (200 mg total) by mouth 2 (two) times daily., Disp: 180 capsule, Rfl: 1 .  Dulaglutide (TRULICITY) 1.5 MXI/3.3ASSOPN, Inject 0.5 mLs into the skin once a week., Disp: 4 pen, Rfl: 11 .  Eszopiclone 3 MG TABS, TAKE 1 TABLET BY MOUTH AT BEDTIME AS NEEDED FOR SLEEP, Disp: 30 tablet, Rfl: 5 .  glucose blood test strip, Use to check blood sugar once daily, Disp: 100 each, Rfl: 2 .  glucose monitoring kit (FREESTYLE) monitoring kit, 1 each by Does not apply route as needed for other. FREESTYLE LITE, Disp: 1 each, Rfl: 1 .  HYDROcodone-acetaminophen (NORCO) 10-325 MG tablet, Take 1 tablet by mouth every 6 (six) hours as needed., Disp: 30 tablet, Rfl: 0 .  JARDIANCE 25 MG TABS tablet, TAKE 1 TABLET BY MOUTH DAILY., Disp: 90 tablet, Rfl: 1 .  Lancets (FREESTYLE) lancets, USE TO CHECK BLOOD SUGAR ONCE DAILY, Disp: 100 each, Rfl: 2 .  metFORMIN (GLUCOPHAGE) 1000 MG tablet, TAKE 1 TABLET BY MOUTH TWICE A DAY WITH A MEAL, Disp: 180 tablet, Rfl:  1 .  pantoprazole (PROTONIX) 40 MG tablet, TAKE 1 TABLET BY MOUTH ONCE DAILY, Disp: 90 tablet, Rfl: 1 .  rosuvastatin (CRESTOR) 10 MG tablet, TAKE 1 TABLET BY MOUTH EVERY OTHER DAY., Disp: 45 tablet, Rfl: 1 .  telmisartan (MICARDIS) 80 MG tablet, Take 1 tablet (80 mg total) by mouth daily., Disp: 90 tablet, Rfl: 0 .  terazosin (HYTRIN) 5 MG capsule, Take 5 mg by mouth daily., Disp: , Rfl:

## 2018-06-04 NOTE — Telephone Encounter (Signed)
Lunesta refill e-sent

## 2018-06-26 MED FILL — metFORMIN HCL 1000 MG TABS: 1000 | 90 days supply | Qty: 180 | Fill #1

## 2018-06-26 MED FILL — TELMISARTAN 80 MG TABS: 80 | 30 days supply | Qty: 30 | Fill #1

## 2018-06-26 MED FILL — TERAZOSIN 5 MG CAPSULE: 5 | 90 days supply | Qty: 90 | Fill #1

## 2018-06-26 MED FILL — ROSUVASTATIN CALCIUM 10 MG: 10 | 90 days supply | Qty: 45 | Fill #1

## 2018-06-28 MED FILL — TRULICITY 1.5 MG/0.5 ML PEN: 1.5 | 28 days supply | Qty: 2 | Fill #4

## 2018-06-28 MED FILL — FREESTYLE LANCETS: 90 days supply | Qty: 100 | Fill #1

## 2018-07-06 MED FILL — ESZOPICLONE 3 MG TABS: 3 | 30 days supply | Qty: 30 | Fill #1

## 2018-08-04 MED FILL — TRULICITY 1.5 MG/0.5 ML PEN: 1.5 | 28 days supply | Qty: 2 | Fill #5

## 2018-08-04 MED FILL — ESZOPICLONE 3 MG TABS: 3 | 30 days supply | Qty: 30 | Fill #2

## 2018-08-04 MED FILL — TELMISARTAN 80 MG TABS: 80 | 30 days supply | Qty: 30 | Fill #2

## 2018-08-13 DIAGNOSIS — R000 Tachycardia, unspecified: Secondary | ICD-10-CM

## 2018-08-13 DIAGNOSIS — E669 Obesity, unspecified: Secondary | ICD-10-CM

## 2018-08-13 DIAGNOSIS — E1159 Type 2 diabetes mellitus with other circulatory complications: Secondary | ICD-10-CM

## 2018-08-16 ENCOUNTER — Ambulatory Visit: Payer: 59 | Admitting: Internal Medicine

## 2018-08-17 ENCOUNTER — Encounter: Payer: Self-pay | Admitting: Internal Medicine

## 2018-08-17 MED FILL — CELECOXIB 200 MG CAPSULE: 200 | 90 days supply | Qty: 180 | Fill #1

## 2018-08-19 ENCOUNTER — Ambulatory Visit: Payer: 59 | Admitting: Internal Medicine

## 2018-08-19 ENCOUNTER — Encounter: Payer: Self-pay | Admitting: Internal Medicine

## 2018-08-19 ENCOUNTER — Other Ambulatory Visit: Payer: Self-pay

## 2018-08-19 DIAGNOSIS — F5101 Primary insomnia: Secondary | ICD-10-CM

## 2018-08-19 DIAGNOSIS — G4733 Obstructive sleep apnea (adult) (pediatric): Secondary | ICD-10-CM

## 2018-08-19 NOTE — Patient Instructions (Signed)
We can continue CPAP auto 5-20, mask of choice, humidifier, supplies, AirView/ auto  Please call if we can help

## 2018-08-19 NOTE — Progress Notes (Signed)
Patient ID: Jourdin Connors, male    DOB: 1955/12/27, 63 y.o.   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 Pt is wearing CPAP nightly and DL attached. No new supplies needed at this time. Weight today 304 pounds Pressure rarely gets too high.  We reviewed his download which looks very good and he decided not to make changes.  Download 100% compliance AHI 2.1/hour with pressure ranging 10-15.  No acute concerns and no changes in his health otherwise.  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  Lunesta 3 mg -----OSA on CPAP 5-20, DME: [facility]; using CPAP nightly, no complaints Sleeping ok off Lunesta and prefers to minimize meds. Sleep hygiene discussed.   ROS-see HPI   + = positive Constitutional:   No-   weight loss, night sweats, fevers, chills, fatigue, lassitude. HEENT:   No-  headaches, difficulty swallowing, tooth/dental problems, sore throat,       No-  sneezing, itching, ear ache, nasal congestion, post nasal drip,  CV:  No-   chest pain, orthopnea, PND, swelling in lower extremities, anasarca, dizziness, palpitations Resp: No-   shortness of breath with exertion or at rest.              No-   productive cough,  No non-productive cough,  No- coughing up of blood.              No-   change in color of mucus.  No- wheezing.   Skin: No-   rash or lesions. GI:  No-   heartburn, indigestion,  abdominal pain, nausea, vomiting,  GU:  MS:  No-   joint pain or swelling.   Neuro-     nothing unusual Psych:  No- change in mood or affect. No depression or anxiety.  No memory loss.  OBJ- Physical Exam General- Alert, Oriented, Affect-appropriate, Distress- none acute, + obese Skin- rash-none, lesions- none, excoriation- none Lymphadenopathy- none Head- atraumatic            Eyes- Gross vision intact, PERRLA, conjunctivae and secretions clear            Ears- Hearing, canals-normal            Nose- Clear, no-Septal dev, mucus, polyps, erosion, perforation             Throat- Mallampati II-III , mucosa clear , drainage- none, tonsils- atrophic Neck- flexible , trachea midline, no stridor , thyroid nl, carotid no bruit Chest - symmetrical excursion , unlabored           Heart/CV- RRR , no murmur , no gallop  , no rub, nl s1 s2                           - JVD- none , edema- none, stasis changes- none, varices- none           Lung- clear to P&A, wheeze- none, cough- none , dullness-none, rub- none  Chest wall-  Abd-  Br/ Gen/ Rectal- Not done, not indicated Extrem- cyanosis- none, clubbing, none, atrophy- none, strength- nl Neuro- grossly intact to observation

## 2018-08-22 ENCOUNTER — Telehealth: Payer: Self-pay | Admitting: Internal Medicine

## 2018-08-23 ENCOUNTER — Telehealth: Payer: Self-pay | Admitting: *Deleted

## 2018-08-23 ENCOUNTER — Other Ambulatory Visit: Payer: 59

## 2018-08-23 DIAGNOSIS — E669 Obesity, unspecified: Secondary | ICD-10-CM

## 2018-08-23 DIAGNOSIS — E785 Hyperlipidemia, unspecified: Secondary | ICD-10-CM

## 2018-08-23 DIAGNOSIS — E1159 Type 2 diabetes mellitus with other circulatory complications: Secondary | ICD-10-CM

## 2018-08-23 DIAGNOSIS — E876 Hypokalemia: Secondary | ICD-10-CM

## 2018-08-23 LAB — COMPREHENSIVE METABOLIC PANEL
ALT: 69 U/L — ABNORMAL HIGH (ref 0–53)
AST: 47 U/L — ABNORMAL HIGH (ref 0–37)
Albumin: 4 g/dL (ref 3.5–5.2)
Alkaline Phosphatase: 62 U/L (ref 39–117)
BUN: 20 mg/dL (ref 6–23)
CO2: 23 mEq/L (ref 19–32)
Calcium: 9.7 mg/dL (ref 8.4–10.5)
Chloride: 105 mEq/L (ref 96–112)
Creatinine, Ser: 1.04 mg/dL (ref 0.40–1.50)
GFR: 72.26 mL/min (ref >60.00)
Glucose, Bld: 150 mg/dL — ABNORMAL HIGH (ref 70–99)
Potassium: 3.7 mEq/L (ref 3.5–5.1)
Sodium: 139 mEq/L (ref 135–145)
Total Bilirubin: 0.6 mg/dL (ref 0.2–1.2)
Total Protein: 7 g/dL (ref 6.0–8.3)

## 2018-08-23 LAB — LIPID PANEL
Cholesterol: 120 mg/dL (ref 0–200)
HDL: 25 mg/dL — ABNORMAL LOW (ref >39.00)
LDL Cholesterol: 65 mg/dL (ref 0–99)
NonHDL: 94.65
Total CHOL/HDL Ratio: 5
Triglycerides: 148 mg/dL (ref 0.0–149.0)
VLDL: 29.6 mg/dL (ref 0.0–40.0)

## 2018-08-23 LAB — MAGNESIUM: Magnesium: 1.9 mg/dL (ref 1.5–2.5)

## 2018-08-23 LAB — HEMOGLOBIN A1C: Hgb A1c MFr Bld: 8 % — ABNORMAL HIGH (ref 4.6–6.5)

## 2018-08-23 NOTE — Telephone Encounter (Signed)
It's [place] but they are able to change it so no worries.

## 2018-08-23 NOTE — Telephone Encounter (Signed)
[facility] requires what tyupe of change when orderig labs?  Not [place]?

## 2018-08-23 NOTE — Telephone Encounter (Signed)
Pt went to [facility] lab this morning to have labs drawn. Please place FUTURE orders ASAP. He told them there were several labs including a magnesium to draw.

## 2018-08-23 NOTE — Telephone Encounter (Signed)
Labs ordered future as lab collect [place] designation thank you for the warning

## 2018-08-23 NOTE — Telephone Encounter (Signed)
He went to [place] lab, but they should be able to change to location. If not, they will call me back again. Thanks

## 2018-08-25 ENCOUNTER — Other Ambulatory Visit (INDEPENDENT_AMBULATORY_CARE_PROVIDER_SITE_OTHER): Payer: 59

## 2018-08-25 DIAGNOSIS — E1159 Type 2 diabetes mellitus with other circulatory complications: Secondary | ICD-10-CM | POA: Diagnosis not present

## 2018-08-25 DIAGNOSIS — I10 Essential (primary) hypertension: Secondary | ICD-10-CM | POA: Diagnosis not present

## 2018-08-25 DIAGNOSIS — E669 Obesity, unspecified: Secondary | ICD-10-CM | POA: Diagnosis not present

## 2018-08-25 DIAGNOSIS — E1169 Type 2 diabetes mellitus with other specified complication: Secondary | ICD-10-CM | POA: Diagnosis not present

## 2018-08-25 LAB — MICROALBUMIN / CREATININE URINE RATIO
Creatinine,U: 84.3 mg/dL
Microalb Creat Ratio: 0.8 mg/g (ref 0.0–30.0)
Microalb, Ur: <0.7 mg/dL (ref 0.0–1.9)

## 2018-08-31 ENCOUNTER — Other Ambulatory Visit: Payer: Self-pay

## 2018-08-31 ENCOUNTER — Other Ambulatory Visit: Payer: Self-pay | Admitting: Internal Medicine

## 2018-08-31 ENCOUNTER — Ambulatory Visit: Payer: 59 | Admitting: *Deleted

## 2018-08-31 VITALS — Ht 70.0 in | Wt 300.0 lb

## 2018-08-31 DIAGNOSIS — Z1211 Encounter for screening for malignant neoplasm of colon: Secondary | ICD-10-CM

## 2018-08-31 MED ORDER — SUPREP BOWEL PREP KIT 17.5-3.13-1.6 GM/177ML PO SOLN: 1.0000 | Freq: Once | ORAL | 0 refills | Status: AC

## 2018-08-31 MED FILL — AMLODIPINE BESYLATE 5 MG TA: 5 | 90 days supply | Qty: 90 | Fill #1

## 2018-08-31 MED FILL — SUPREP BOWEL PREP KIT: 17.5-3.13-1 | 2 days supply | Qty: 354 | Fill #0

## 2018-08-31 MED FILL — JARDIANCE 25 MG TABLET: 25 | 90 days supply | Qty: 90 | Fill #1

## 2018-08-31 MED FILL — TRULICITY 1.5 MG/0.5 ML PEN: 1.5 | 28 days supply | Qty: 2 | Fill #6

## 2018-08-31 NOTE — Progress Notes (Signed)
No egg or soy allergy known to patient  No issues with past sedation with any surgeries  or procedures, no intubation problems  No diet pills per patient No home 02 use per patient  No blood thinners per patient  Pt denies issues with constipation  No A fib or A flutter  EMMI video sent to pt's e mail   Pt verified name, DOB, address and insurance during PV today. Pt mailed instruction packet to included paper to complete and mail back to [facility] with addressed and stamped envelope, Emmi video, copy of consent form to read and not return, and instructions.PV completed over the phone. Pt encouraged to call with questions or issues   Pt is aware that care partner will wait in the car during procedure; if they feel like they will be too hot to wait in the car; they may wait in the lobby.  We want them to wear a mask (we do not have any that we can provide them), practice social distancing, and we will check their temperatures when they get here.  I did remind patient that their care partner needs to stay in the parking lot the entire time. Pt will wear mask into building.  

## 2018-09-03 MED FILL — TELMISARTAN 80 MG TABLET: 80 | 10 days supply | Qty: 30 | Fill #0

## 2018-09-03 MED FILL — ALLOPURINOL 300 MG TAB: 300 | 90 days supply | Qty: 90 | Fill #0

## 2018-09-03 MED FILL — PANTOPRAZOLE SOD DR 40 MG T: 40 | 90 days supply | Qty: 90 | Fill #0

## 2018-09-03 MED FILL — FREESTYLE LITE TEST STRIP: 90 days supply | Qty: 100 | Fill #0

## 2018-09-10 ENCOUNTER — Encounter: Payer: Self-pay | Admitting: Internal Medicine

## 2018-09-16 ENCOUNTER — Telehealth: Payer: Self-pay

## 2018-09-16 NOTE — Telephone Encounter (Signed)
Pt returned call and answered “No” to all questions.  °  °Pt made aware of that care partner may come to the lobby during the procedure but will need to provide their own mask. ° ° °

## 2018-09-16 NOTE — Telephone Encounter (Signed)
Covid-19 screening questions   Do you now or have you had a fever in the last 14 days?  Do you have any respiratory symptoms of shortness of breath or cough now or in the last 14 days?  Do you have any family members or close contacts with diagnosed or suspected Covid-19 in the past 14 days?  Have you been tested for Covid-19 and found to be positive?       

## 2018-09-17 ENCOUNTER — Encounter: Payer: Self-pay | Admitting: Internal Medicine

## 2018-09-17 ENCOUNTER — Ambulatory Visit (AMBULATORY_SURGERY_CENTER): Payer: 59 | Admitting: Internal Medicine

## 2018-09-17 ENCOUNTER — Other Ambulatory Visit: Payer: Self-pay

## 2018-09-17 VITALS — BP 137/80 | HR 72 | Temp 98.1°F | Resp 14 | Ht 70.0 in | Wt 300.0 lb

## 2018-09-17 DIAGNOSIS — Z1211 Encounter for screening for malignant neoplasm of colon: Secondary | ICD-10-CM

## 2018-09-17 MED ORDER — SODIUM CHLORIDE 0.9 % IV SOLN: 500.0000 mL | Freq: Once | INTRAVENOUS | Status: AC

## 2018-09-17 NOTE — Progress Notes (Signed)
Pt's states no medical or surgical changes since previsit or office visit. 

## 2018-09-17 NOTE — Progress Notes (Signed)
Temperature taken by J.B., VS C.W.

## 2018-09-17 NOTE — Patient Instructions (Addendum)
No polyps or cancer seen.  You do have diverticulosis - thickened muscle rings and pouches in the colon wall. Please read the handout about this condition.  The posterior anal canal was slightly indurated sio there may be a slight fissure causing the painful defecation. Keep stools soft (Benefiber is good - MiraLax is more effective but may not be necessary) and it should go away.  Next routine colonoscopy or other screening test in 10 years - 2030  I appreciate the opportunity to care for you. Gatha Mayer, MD, FACG  YOU HAD AN ENDOSCOPIC PROCEDURE TODAY AT [facility]:   Refer to the procedure report that was given to you for any specific questions about what was found during the examination.  If the procedure report does not answer your questions, please call your gastroenterologist to clarify.  If you requested that your care partner not be given the details of your procedure findings, then the procedure report has been included in a sealed envelope for you to review at your convenience later.  YOU SHOULD EXPECT: Some feelings of bloating in the abdomen. Passage of more gas than usual.  Walking can help get rid of the air that was put into your GI tract during the procedure and reduce the bloating. If you had a lower endoscopy (such as a colonoscopy or flexible sigmoidoscopy) you may notice spotting of blood in your stool or on the toilet paper. If you underwent a bowel prep for your procedure, you may not have a normal bowel movement for a few days.  Please Note:  You might notice some irritation and congestion in your nose or some drainage.  This is from the oxygen used during your procedure.  There is no need for concern and it should clear up in a day or so.  SYMPTOMS TO REPORT IMMEDIATELY:   Following lower endoscopy (colonoscopy or flexible sigmoidoscopy):  Excessive amounts of blood in the stool  Significant tenderness or worsening of abdominal pains  Swelling of  the abdomen that is new, acute  Fever of 100F or higher  For urgent or emergent issues, a gastroenterologist can be reached at any hour by calling [phone].   DIET:  We do recommend a small meal at first, but then you may proceed to your regular diet.  Drink plenty of fluids but you should avoid alcoholic beverages for 24 hours.  ACTIVITY:  You should plan to take it easy for the rest of today and you should NOT DRIVE or use heavy machinery until tomorrow (because of the sedation medicines used during the test).    FOLLOW UP: Our staff will call the number listed on your records 48-72 hours following your procedure to check on you and address any questions or concerns that you may have regarding the information given to you following your procedure. If we do not reach you, we will leave a message.  We will attempt to reach you two times.  During this call, we will ask if you have developed any symptoms of COVID 19. If you develop any symptoms (ie: fever, flu-like symptoms, shortness of breath, cough etc.) before then, please call [phone].  If you test positive for Covid 19 in the 2 weeks post procedure, please call and report this information to [place].    If any biopsies were taken you will be contacted by phone or by letter within the next 1-3 weeks.  Please call us at [phone] if you have not  heard about the biopsies in 3 weeks.    SIGNATURES/CONFIDENTIALITY: You and/or your care partner have signed paperwork which will be entered into your electronic medical record.  These signatures attest to the fact that that the information above on your After Visit Summary has been reviewed and is understood.  Full responsibility of the confidentiality of this discharge information lies with you and/or your care-partner.

## 2018-09-17 NOTE — Op Note (Signed)
[facility] Patient Name: Christopher Stewart Procedure Date: 09/17/2018 7:58 AM MRN: [id number] Endoscopist: Gatha Mayer , MD Age: 63 Referring MD:  Date of Birth: 05/26/1955 Gender: Male Account #: [id number] Procedure:                Colonoscopy Indications:              Screening for colorectal malignant neoplasm, Last                            colonoscopy: 2009 Medicines:                Propofol per Anesthesia, Monitored Anesthesia Care Procedure:                Pre-Anesthesia Assessment:                           - Prior to the procedure, a History and Physical                            was performed, and patient medications and                            allergies were reviewed. The patient's tolerance of                            previous anesthesia was also reviewed. The risks                            and benefits of the procedure and the sedation                            options and risks were discussed with the patient.                            All questions were answered, and informed consent                            was obtained. Prior Anticoagulants: The patient has                            taken no previous anticoagulant or antiplatelet                            agents. ASA Grade Assessment: III - A patient with                            severe systemic disease. After reviewing the risks                            and benefits, the patient was deemed in                            satisfactory condition to undergo the procedure.  After obtaining informed consent, the colonoscope                            was passed under direct vision. Throughout the                            procedure, the patient's blood pressure, pulse, and                            oxygen saturations were monitored continuously. The                            Colonoscope was introduced through the anus and                            advanced to the  the cecum, identified by                            appendiceal orifice and ileocecal valve. The                            ileocecal valve, appendiceal orifice, and rectum                            were photographed. The quality of the bowel                            preparation was excellent. The bowel preparation                            used was SUPREP via split dose instruction. The                            colonoscopy was somewhat difficult due to                            significant looping. Successful completion of the                            procedure was aided by applying abdominal pressure.                            The patient tolerated the procedure well. Scope In: 8:03:20 AM Scope Out: 8:17:12 AM Scope Withdrawal Time: 0 hours 9 minutes 4 seconds  Total Procedure Duration: 0 hours 13 minutes 52 seconds  Findings:                 The digital rectal exam findings include suspected                            mild anal fissure, slight induration posteriorly.                            Pertinent negatives include normal prostate (size,  shape, and consistency).                           A few diverticula were found in the sigmoid colon.                           The exam was otherwise without abnormality on                            direct and retroflexion views. Complications:            No immediate complications. Estimated blood loss:                            None. Estimated Blood Loss:     Estimated blood loss: none. Impression:               - Posterior anal fissure suspected on digital                            rectal exam. Not seen on endoscopic view though                            could have been a tiny sentinael pile.                           - Mild diverticulosis in the sigmoid colon.                           - The examination was otherwise normal on direct                            and retroflexion views.                            - No specimens collected. Recommendation:           - Repeat colonoscopy in 10 years for screening                            purposes.                           - Resume previous diet.                           - Continue present medications.                           - High fiber diet. Fiber supplements - Benefiber                            can be used if needed Gatha Mayer, MD 09/17/2018 8:27:26 AM This report has been signed electronically.

## 2018-09-17 NOTE — Progress Notes (Signed)
A and O x3. Report to RN. Tolerated MAC anesthesia well.

## 2018-09-22 ENCOUNTER — Telehealth: Payer: Self-pay | Admitting: *Deleted

## 2018-09-22 ENCOUNTER — Telehealth: Payer: Self-pay

## 2018-09-22 NOTE — Telephone Encounter (Signed)
Called [phone] and left a messaged we tried to reach pt for a follow up call. maw

## 2018-09-22 NOTE — Telephone Encounter (Signed)
  Follow up Call-  Call back number 09/17/2018  Post procedure Call Back phone  # [phone]  Permission to leave phone message Yes  Some recent data might be hidden     Patient questions:  Do you have a fever, pain , or abdominal swelling? No. Pain Score  0 *  Have you tolerated food without any problems? Yes.    Have you been able to return to your normal activities? Yes.    Do you have any questions about your discharge instructions: Diet   No. Medications  No. Follow up visit  No.  Do you have questions or concerns about your Care? No.  Actions: * If pain score is 4 or above: No action needed, pain <4.  1. Have you developed a fever since your procedure? no  2.   Have you had an respiratory symptoms (SOB or cough) since your procedure? no  3.   Have you tested positive for COVID 19 since your procedure no  4.   Have you had any family members/close contacts diagnosed with the COVID 19 since your procedure?  no   If yes to any of these questions please route to Joylene John, RN and Alphonsa Gin, Therapist, sports.

## 2018-09-28 MED FILL — TRULICITY 1.5 MG/0.5 ML PEN: 1.5 | 28 days supply | Qty: 2 | Fill #7

## 2018-10-03 ENCOUNTER — Encounter: Payer: Self-pay | Admitting: Internal Medicine

## 2018-10-03 NOTE — Assessment & Plan Note (Signed)
He benefits from CPAP, with good compliance and control.  Plan- Continue auto 5-20

## 2018-10-03 NOTE — Assessment & Plan Note (Signed)
He understands importance of weight control, but has not made the life-style changes needed to accomplish meaningful weight loss.

## 2018-10-03 NOTE — Assessment & Plan Note (Signed)
Dicussed sleep hygiene and options. Comfortable off Lunesta now.

## 2018-10-13 ENCOUNTER — Other Ambulatory Visit: Payer: Self-pay | Admitting: Internal Medicine

## 2018-10-13 MED FILL — ROSUVASTATIN CALCIUM 10 MG: 10 | 90 days supply | Qty: 45 | Fill #0

## 2018-10-13 MED FILL — metFORMIN HCL 1000 MG TABS: 1000 | 90 days supply | Qty: 180 | Fill #0

## 2018-10-13 MED FILL — TELMISARTAN 80 MG TABLET: 80 | 10 days supply | Qty: 30 | Fill #1

## 2018-10-13 MED FILL — ESZOPICLONE 3 MG TABS: 3 | 30 days supply | Qty: 30 | Fill #3

## 2018-10-26 MED FILL — TERAZOSIN 5 MG CAPSULE: 5 | 90 days supply | Qty: 90 | Fill #2

## 2018-10-26 MED FILL — TRULICITY 1.5 MG/0.5 ML PEN: 1.5 | 28 days supply | Qty: 2 | Fill #8

## 2018-11-08 ENCOUNTER — Other Ambulatory Visit: Payer: Self-pay | Admitting: Internal Medicine

## 2018-11-08 MED FILL — TELMISARTAN 80 MG TABLET: 80 | 10 days supply | Qty: 30 | Fill #2

## 2018-11-08 MED FILL — CELECOXIB 200 MG CAP: 200 | 90 days supply | Qty: 180 | Fill #0

## 2018-11-10 DIAGNOSIS — H5212 Myopia, left eye: Secondary | ICD-10-CM | POA: Diagnosis not present

## 2018-11-10 DIAGNOSIS — E119 Type 2 diabetes mellitus without complications: Secondary | ICD-10-CM | POA: Diagnosis not present

## 2018-11-10 DIAGNOSIS — H10413 Chronic giant papillary conjunctivitis, bilateral: Secondary | ICD-10-CM | POA: Diagnosis not present

## 2018-11-10 DIAGNOSIS — H04123 Dry eye syndrome of bilateral lacrimal glands: Secondary | ICD-10-CM | POA: Diagnosis not present

## 2018-11-10 DIAGNOSIS — H5211 Myopia, right eye: Secondary | ICD-10-CM | POA: Diagnosis not present

## 2018-11-10 DIAGNOSIS — G4733 Obstructive sleep apnea (adult) (pediatric): Secondary | ICD-10-CM | POA: Diagnosis not present

## 2018-11-10 LAB — HM DIABETES EYE EXAM

## 2018-11-10 MED FILL — ESZOPICLONE 3 MG TABS: 3 | 30 days supply | Qty: 30 | Fill #4

## 2018-11-19 ENCOUNTER — Encounter: Payer: Self-pay | Admitting: Internal Medicine

## 2018-11-26 DIAGNOSIS — Z125 Encounter for screening for malignant neoplasm of prostate: Secondary | ICD-10-CM

## 2018-11-26 DIAGNOSIS — G629 Polyneuropathy, unspecified: Secondary | ICD-10-CM

## 2018-11-26 DIAGNOSIS — E669 Obesity, unspecified: Secondary | ICD-10-CM

## 2018-11-29 ENCOUNTER — Other Ambulatory Visit: Payer: Self-pay | Admitting: Internal Medicine

## 2018-11-29 MED FILL — ALLOPURINOL 300 MG TABS: 300 | 90 days supply | Qty: 90 | Fill #1

## 2018-11-29 MED FILL — TRULICITY 1.5 MG/0.5 ML PEN: 1.5 | 28 days supply | Qty: 2 | Fill #9

## 2018-11-29 MED FILL — PANTOPRAZOLE SOD DR 40 MG T: 40 | 90 days supply | Qty: 90 | Fill #1

## 2018-11-29 MED FILL — AMLODIPINE BESYLATE 5 MG TA: 5 | 90 days supply | Qty: 90 | Fill #0

## 2018-11-29 MED FILL — JARDIANCE 25 MG TABLET: 25 | 90 days supply | Qty: 90 | Fill #0

## 2018-12-03 ENCOUNTER — Other Ambulatory Visit (INDEPENDENT_AMBULATORY_CARE_PROVIDER_SITE_OTHER): Payer: 59

## 2018-12-03 DIAGNOSIS — G629 Polyneuropathy, unspecified: Secondary | ICD-10-CM | POA: Diagnosis not present

## 2018-12-03 DIAGNOSIS — Z125 Encounter for screening for malignant neoplasm of prostate: Secondary | ICD-10-CM

## 2018-12-03 DIAGNOSIS — E669 Obesity, unspecified: Secondary | ICD-10-CM

## 2018-12-03 DIAGNOSIS — E1159 Type 2 diabetes mellitus with other circulatory complications: Secondary | ICD-10-CM

## 2018-12-03 DIAGNOSIS — E1169 Type 2 diabetes mellitus with other specified complication: Secondary | ICD-10-CM | POA: Diagnosis not present

## 2018-12-03 DIAGNOSIS — I10 Essential (primary) hypertension: Secondary | ICD-10-CM

## 2018-12-03 LAB — COMPREHENSIVE METABOLIC PANEL
ALT: 62 U/L — ABNORMAL HIGH (ref 0–53)
AST: 40 U/L — ABNORMAL HIGH (ref 0–37)
Albumin: 4.1 g/dL (ref 3.5–5.2)
Alkaline Phosphatase: 68 U/L (ref 39–117)
BUN: 22 mg/dL (ref 6–23)
CO2: 25 mEq/L (ref 19–32)
Calcium: 9.7 mg/dL (ref 8.4–10.5)
Chloride: 104 mEq/L (ref 96–112)
Creatinine, Ser: 1.04 mg/dL (ref 0.40–1.50)
GFR: 72.2 mL/min (ref >60.00)
Glucose, Bld: 158 mg/dL — ABNORMAL HIGH (ref 70–99)
Potassium: 4.4 mEq/L (ref 3.5–5.1)
Sodium: 139 mEq/L (ref 135–145)
Total Bilirubin: 0.5 mg/dL (ref 0.2–1.2)
Total Protein: 6.6 g/dL (ref 6.0–8.3)

## 2018-12-03 LAB — VITAMIN B12: Vitamin B-12: 426 pg/mL (ref 211–911)

## 2018-12-03 LAB — HEMOGLOBIN A1C: Hgb A1c MFr Bld: 8.6 % — ABNORMAL HIGH (ref 4.6–6.5)

## 2018-12-03 LAB — PSA: PSA: 0.28 ng/mL (ref 0.10–4.00)

## 2018-12-16 ENCOUNTER — Other Ambulatory Visit: Payer: Self-pay

## 2018-12-16 ENCOUNTER — Encounter: Payer: Self-pay | Admitting: Internal Medicine

## 2018-12-16 ENCOUNTER — Ambulatory Visit (INDEPENDENT_AMBULATORY_CARE_PROVIDER_SITE_OTHER): Payer: 59 | Admitting: Internal Medicine

## 2018-12-16 DIAGNOSIS — E785 Hyperlipidemia, unspecified: Secondary | ICD-10-CM | POA: Diagnosis not present

## 2018-12-16 DIAGNOSIS — I10 Essential (primary) hypertension: Secondary | ICD-10-CM | POA: Diagnosis not present

## 2018-12-16 DIAGNOSIS — E1165 Type 2 diabetes mellitus with hyperglycemia: Secondary | ICD-10-CM | POA: Diagnosis not present

## 2018-12-16 MED ORDER — METFORMIN HCL 850 MG PO TABS: ORAL_TABLET | ORAL | 1 refills | Status: DC

## 2018-12-16 MED ORDER — AMLODIPINE BESYLATE 10 MG PO TABS: 10.0000 mg | ORAL_TABLET | Freq: Every day | ORAL | 1 refills | Status: DC

## 2018-12-16 MED ORDER — TRULICITY 1.5 MG/0.5ML ~~LOC~~ SOAJ: 1.0000 mL | SUBCUTANEOUS | 11 refills | Status: DC

## 2018-12-16 NOTE — Progress Notes (Signed)
Telephone Note   This visit type was conducted due to national recommendations for restrictions regarding the COVID-19 pandemic (e.g. social distancing).  This format is felt to be most appropriate for this patient at this time.  All issues noted in this document were discussed and addressed.  No physical exam was performed (except for noted visual exam findings with Video Visits).   I connected with@ on 12/16/18 at 12:00 PM EST by telephone  and verified that I am speaking with the correct person using two identifiers. Location patient: home Location provider: work or home office Persons participating in the virtual visit: patient, provider  I discussed the limitations, risks, security and privacy concerns of performing an evaluation and management service by telephone and the availability of in person appointments. I also discussed with the patient that there may be a patient responsible charge related to this service. The patient expressed understanding and agreed to proceed.  Reason for visit: follow up on type 2 DM and hypertension    HPI:   He feels generally  well, is not exercising at all   and checking blood sugars once daily at variable times.  BS have been under  fasting and 180 to 220 post prandially.  Denies any recent hypoglyemic events.  Taiking Trulicty at the lower dose , metformin at 1000 mg bid, Jardiance  as directed. Following a carbohydrate modified diet 3 or 4 days per week. Denies numbness, burning and tingling of extremities. Appetite is good.   Hypertension: patient checks blood pressure twice weekly at home.  Readings have been for the most part  > 130/80 at rest . Patient is following a reduced salt diet most days and is taking medications as prescribed   ROS: See pertinent positives and negatives per HPI.  Past Medical History:  Diagnosis Date  . Arthritis   . 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 Diabetes mellitus, type 2 ([place])   . Diverticulosis    At Age 55 on  colonoscopy  . GERD (gastroesophageal reflux disease)   . Gout   . Hyperlipemia   . Hypertension   . Insomnia 04/27/2010  . Nephrolithiasis   . Obstructive sleep apnea 04/27/2010  . OSA on CPAP   . Reactive airway disease   . Sleep apnea    wears cpap   . Tinea cruris     Past Surgical History:  Procedure Laterality Date  . COLONOSCOPY     last 11 yrs   . cystoscopy  09/2008   & stone removal  . CYSTOSCOPY/URETEROSCOPY/HOLMIUM LASER/STENT PLACEMENT Right 08/19/2017   Procedure: CYSTOSCOPY/RIGHT RETROGRADE RIGHT URETEROSCOPY/ RIGHT URETERAL STENT PLACEMENT;  Surgeon: Raynelle Bring, MD;  Location: [facility];  Service: Urology;  Laterality: Right;  . SURGERY SCROTAL / TESTICULAR N/A 1966   for undescended testicle    Family History  Problem Relation Age of Onset  . Heart disease Mother   . Deep vein thrombosis Father   . Colon cancer Neg Hx   . Colon polyps Neg Hx   . Esophageal cancer Neg Hx   . Rectal cancer Neg Hx   . Stomach cancer Neg Hx     SOCIAL HX:  reports that he has never smoked. He has never used smokeless tobacco. He reports that he does not drink alcohol or use drugs.   Current Outpatient Medications:  .  allopurinol (ZYLOPRIM) 300 MG tablet, TAKE 1 TABLET BY MOUTH ONCE DAILY, Disp: 90 tablet, Rfl: 1 .  amLODipine (NORVASC) 10 MG tablet, Take 1 tablet (  10 mg total) by mouth daily., Disp: 90 tablet, Rfl: 1 .  aspirin 81 MG tablet, Take 81 mg by mouth every other day. , Disp: , Rfl:  .  celecoxib (CELEBREX) 200 MG capsule, TAKE 1 CAPSULE BY MOUTH 2 TIMES DAILY., Disp: 180 capsule, Rfl: 1 .  Eszopiclone 3 MG TABS, TAKE 1 TABLET BY MOUTH EVERY NIGHT AT BEDTIME AS NEEDED FOR SLEEP, Disp: 30 tablet, Rfl: 5 .  glucose blood (FREESTYLE LITE) test strip, USE TO CHECK BLOOD SUGAR ONCE DAILY, Disp: 100 strip, Rfl: 2 .  glucose monitoring kit (FREESTYLE) monitoring kit, 1 each by Does not apply route as needed for other. FREESTYLE LITE, Disp: 1 each, Rfl: 1 .  JARDIANCE 25  MG TABS tablet, TAKE 1 TABLET BY MOUTH DAILY., Disp: 90 tablet, Rfl: 1 .  Lancets (FREESTYLE) lancets, USE TO CHECK BLOOD SUGAR ONCE DAILY, Disp: 100 each, Rfl: 2 .  metFORMIN (GLUCOPHAGE) 850 MG tablet, TAKE 1 TABLET BY MOUTH  Three times daily, Disp: 90 tablet, Rfl: 1 .  pantoprazole (PROTONIX) 40 MG tablet, TAKE 1 TABLET BY MOUTH ONCE DAILY, Disp: 90 tablet, Rfl: 1 .  rosuvastatin (CRESTOR) 10 MG tablet, TAKE 1 TABLET BY MOUTH EVERY OTHER DAY., Disp: 45 tablet, Rfl: 1 .  telmisartan (MICARDIS) 80 MG tablet, TAKE 1 TABLET BY MOUTH DAILY., Disp: 90 tablet, Rfl: 1 .  terazosin (HYTRIN) 5 MG capsule, Take 5 mg by mouth daily., Disp: , Rfl:  .  Dulaglutide (TRULICITY) 3 ZY/6.0YT SOPN, Inject 3 mg into the skin once a week., Disp: 4 pen, Rfl: 3 .  HYDROcodone-acetaminophen (NORCO) 10-325 MG tablet, Take 1 tablet by mouth every 6 (six) hours as needed. (Patient not taking: Reported on 12/16/2018), Disp: 30 tablet, Rfl: 0  Current Facility-Administered Medications:  .  0.9 %  sodium chloride infusion, 500 mL, Intravenous, Once, Gatha Mayer, MD  EXAM:   General impression: alert, cooperative and articulate.  No signs of being in distress  Lungs: speech is fluent sentence length suggests that patient is not short of breath and not punctuated by cough, sneezing or sniffing. Marland Kitchen   Psych: affect normal.  speech is articulate and non pressured .  Denies suicidal thoughts    ASSESSMENT AND PLAN:  Discussed the following assessment and plan:  Essential hypertension  Uncontrolled type 2 diabetes mellitus with hyperglycemia (HCC)  Hyperlipidemia LDL goal <100  Essential hypertension Goal is 120/70.  Increase amlodipine to 10 mg daily,  Continue ARB  Lab Results  Component Value Date   CREATININE 1.04 12/03/2018   Lab Results  Component Value Date   MICROALBUR <0.7 08/25/2018   MICROALBUR 0.7 04/10/2017       Uncontrolled type 2 diabetes mellitus (HCC) Increase Trulicity to 3 mg dose  per week..  Increase metformin to 850 mg tid (2550 mg total)   Hyperlipidemia LDL goal <100 Managed with every other day crestor 10 mg .  No changes today  Lab Results  Component Value Date   CHOL 120 08/23/2018   HDL 25.00 (L) 08/23/2018   LDLCALC 65 08/23/2018   LDLDIRECT 87.0 11/19/2015   TRIG 148.0 08/23/2018   CHOLHDL 5 08/23/2018   Lab Results  Component Value Date   ALT 62 (H) 12/03/2018   AST 40 (H) 12/03/2018   ALKPHOS 68 12/03/2018   BILITOT 0.5 12/03/2018       I discussed the assessment and treatment plan with the patient. The patient was provided an opportunity to ask questions and  all were answered. The patient agreed with the plan and demonstrated an understanding of the instructions.   The patient was advised to call back or seek an in-person evaluation if the symptoms worsen or if the condition fails to improve as anticipated.  I provided  25 minutes of non-face-to-face time during this encounter reviewing patient's current problems and post surgeries.  Providing counseling on the above mentioned problems , and coordination  of care .  Crecencio Mc, MD

## 2018-12-16 NOTE — Patient Instructions (Signed)
Increase metformin to 850 mg tid  Increase trulicity to 3 mg weekly  Increase amlodipine to 10 mg daily   Consider reducing celebrex over the next holiday to monitor effect on BP

## 2018-12-17 ENCOUNTER — Telehealth: Payer: Self-pay | Admitting: Internal Medicine

## 2018-12-17 MED ORDER — TRULICITY 3 MG/0.5ML ~~LOC~~ SOAJ: 3.0000 mg | SUBCUTANEOUS | 3 refills | Status: DC

## 2018-12-17 MED FILL — TELMISARTAN 80 MG TABLET: 80 | 30 days supply | Qty: 30 | Fill #3

## 2018-12-17 MED FILL — metFORMIN HCL 850 MG TABS: 850 | 30 days supply | Qty: 90 | Fill #0

## 2018-12-17 MED FILL — AMLODIPINE BESYLATE 10 MG T: 10 | 90 days supply | Qty: 90 | Fill #0

## 2018-12-17 MED FILL — TRULICITY 3 MG/0.5ML SOPN: 3 | 28 days supply | Qty: 2 | Fill #0

## 2018-12-17 NOTE — Telephone Encounter (Signed)
Per office note 12/16/18 instructions new dose trulicity 3 mg weekly, I have sent to pharmacy FYI.

## 2018-12-17 NOTE — Telephone Encounter (Signed)
Pharmacy called stating the strength on the pts trulicity was supposed to be increased. Please advise.    [address]  [place] [address]  Phone: [phone] Fax: [phone]  Not a 24 hour pharmacy; exact hours not known.

## 2018-12-17 NOTE — Telephone Encounter (Signed)
I did increase the dose in ML not mg I guess that's the wrong way to do it. Thank you for handling.  EGG/colonoscopy was fine. .  Dr Modena Nunnery is my new favorite GI lady!  She's wonderful Press photographer) and her bedside manner is so caring.  Just an [place]

## 2018-12-19 DIAGNOSIS — IMO0002 Reserved for concepts with insufficient information to code with codable children: Secondary | ICD-10-CM | POA: Insufficient documentation

## 2018-12-19 DIAGNOSIS — E1165 Type 2 diabetes mellitus with hyperglycemia: Secondary | ICD-10-CM | POA: Insufficient documentation

## 2018-12-19 NOTE — Assessment & Plan Note (Signed)
Goal is 120/70.  Increase amlodipine to 10 mg daily,  Continue ARB  Lab Results  Component Value Date   CREATININE 1.04 12/03/2018   Lab Results  Component Value Date   MICROALBUR <0.7 08/25/2018   MICROALBUR 0.7 04/10/2017

## 2018-12-19 NOTE — Assessment & Plan Note (Signed)
Increase Trulicity to 3 mg dose per week..  Increase metformin to 850 mg tid (2550 mg total)

## 2018-12-19 NOTE — Assessment & Plan Note (Signed)
Managed with every other day crestor 10 mg .  No changes today  Lab Results  Component Value Date   CHOL 120 08/23/2018   HDL 25.00 (L) 08/23/2018   LDLCALC 65 08/23/2018   LDLDIRECT 87.0 11/19/2015   TRIG 148.0 08/23/2018   CHOLHDL 5 08/23/2018   Lab Results  Component Value Date   ALT 62 (H) 12/03/2018   AST 40 (H) 12/03/2018   ALKPHOS 68 12/03/2018   BILITOT 0.5 12/03/2018

## 2019-01-15 ENCOUNTER — Telehealth: Payer: 59 | Admitting: Family

## 2019-01-15 DIAGNOSIS — S39012A Strain of muscle, fascia and tendon of lower back, initial encounter: Secondary | ICD-10-CM | POA: Diagnosis not present

## 2019-01-15 MED ORDER — METHOCARBAMOL 500 MG PO TABS: 500.0000 mg | ORAL_TABLET | Freq: Four times a day (QID) | ORAL | 0 refills | Status: DC

## 2019-01-15 MED ORDER — ETODOLAC 400 MG PO TABS: 400.0000 mg | ORAL_TABLET | Freq: Every day | ORAL | 0 refills | Status: DC

## 2019-01-15 MED ORDER — BACLOFEN 10 MG PO TABS: 10.0000 mg | ORAL_TABLET | Freq: Three times a day (TID) | ORAL | 0 refills | Status: DC

## 2019-01-15 NOTE — Progress Notes (Signed)
We are sorry that you are not feeling well.  Here is how we plan to help!  Based on what you have shared with me it looks like you mostly have acute back pain.  Acute back pain is defined as musculoskeletal pain that can resolve in 1-3 weeks with conservative treatment.  I have prescribed Etodolac 300 mg take one by mouth twice a day non-steroid anti-inflammatory (NSAID) as well as Baclofen 10 mg every eight hours as needed which is a muscle relaxer  Some patients experience stomach irritation or in increased heartburn with anti-inflammatory drugs.  Please keep in mind that muscle relaxer's can cause fatigue and should not be taken while at work or driving.  Back pain is very common.  The pain often gets better over time.  The cause of back pain is usually not dangerous.  Most people can learn to manage their back pain on their own.  Home Care Stay active.  Start with short walks on flat ground if you can.  Try to walk farther each day. Do not sit, drive or stand in one place for more than 30 minutes.  Do not stay in bed. Do not avoid exercise or work.  Activity can help your back heal faster. Be careful when you bend or lift an object.  Bend at your knees, keep the object close to you, and do not twist. Sleep on a firm mattress.  Lie on your side, and bend your knees.  If you lie on your back, put a pillow under your knees. Only take medicines as told by your doctor. Put ice on the injured area. Put ice in a plastic bag Place a towel between your skin and the bag Leave the ice on for 15-20 minutes, 3-4 times a day for the first 2-3 days. 210 After that, you can switch between ice and heat packs. Ask your doctor about back exercises or massage. Avoid feeling anxious or stressed.  Find good ways to deal with stress, such as exercise.  Get Help Right Way If: Your pain does not go away with rest or medicine. Your pain does not go away in 1 week. You have new problems. You do not feel  well. The pain spreads into your legs. You cannot control when you poop (bowel movement) or pee (urinate) You feel sick to your stomach (nauseous) or throw up (vomit) You have belly (abdominal) pain. You feel like you may pass out (faint). If you develop a fever.  Make Sure you: Understand these instructions. Will watch your condition Will get help right away if you are not doing well or get worse.  Your e-visit answers were reviewed by a board certified advanced clinical practitioner to complete your personal care plan.  Depending on the condition, your plan could have included both over the counter or prescription medications.  If there is a problem please reply  once you have received a response from your provider.  Your safety is important to us.  If you have drug allergies check your prescription carefully.    You can use MyChart to ask questions about today's visit, request a non-urgent call back, or ask for a work or school excuse for 24 hours related to this e-Visit. If it has been greater than 24 hours you will need to follow up with your provider, or enter a new e-Visit to address those concerns.  You will get an e-mail in the next two days asking about your experience.  I hope that   new e-Visit to address those concerns.  You will get an e-mail in the next two days asking about your experience.  I hope that your e-visit has been valuable and will speed your recovery. Thank you for using e-visits.   Greater than 5 minutes, yet less than 10 minutes of time have been spent researching, coordinating, and implementing care for this patient today.  Thank you for the details you included in the comment boxes. Those details are very helpful in determining the best course of treatment for you and help us to provide the best care.  

## 2019-01-15 NOTE — Addendum Note (Signed)
Addended by: Dutch Quint B on: 01/15/2019 07:33 PM   Modules accepted: Orders

## 2019-01-17 ENCOUNTER — Other Ambulatory Visit: Payer: Self-pay | Admitting: Internal Medicine

## 2019-01-17 MED FILL — TRULICITY 3 MG/0.5ML SOPN: 3 | 28 days supply | Qty: 2 | Fill #1

## 2019-01-17 MED FILL — TERAZOSIN 5 MG CAPSULE: 5 | 90 days supply | Qty: 90 | Fill #3

## 2019-01-17 MED FILL — FREESTYLE LITE TEST STRIP: 90 days supply | Qty: 100 | Fill #1

## 2019-01-17 MED FILL — FREESTYLE LANCETS: 90 days supply | Qty: 100 | Fill #2

## 2019-01-17 MED FILL — metFORMIN HCL 1000 MG TABS: 1000 | 90 days supply | Qty: 180 | Fill #1

## 2019-01-17 MED FILL — TELMISARTAN 80 MG TABLET: 80 | 30 days supply | Qty: 30 | Fill #4

## 2019-01-17 MED FILL — ROSUVASTATIN CALCIUM 10 MG: 10 | 90 days supply | Qty: 45 | Fill #1

## 2019-01-17 NOTE — Telephone Encounter (Signed)
Dr. Annamaria Boots, please advise if you are okay refilling med for pt.  Allergies  Allergen Reactions  . Belviq [Lorcaserin Hcl] Other (See Comments)    Did not tolerate well.  . Byetta 10 Mcg Pen [Exenatide] Nausea Only  . Horse-Derived Products Hives  . Other     Thimerosal-in Contacts solution-swelling and redness of eyes  . Qsymia [Phentermine-Topiramate]     Heart palpitations     Current Outpatient Medications:  .  allopurinol (ZYLOPRIM) 300 MG tablet, TAKE 1 TABLET BY MOUTH ONCE DAILY, Disp: 90 tablet, Rfl: 1 .  amLODipine (NORVASC) 10 MG tablet, Take 1 tablet (10 mg total) by mouth daily., Disp: 90 tablet, Rfl: 1 .  aspirin 81 MG tablet, Take 81 mg by mouth every other day. , Disp: , Rfl:  .  baclofen (LIORESAL) 10 MG tablet, Take 1 tablet (10 mg total) by mouth 3 (three) times daily., Disp: 30 each, Rfl: 0 .  celecoxib (CELEBREX) 200 MG capsule, TAKE 1 CAPSULE BY MOUTH 2 TIMES DAILY., Disp: 180 capsule, Rfl: 1 .  Dulaglutide (TRULICITY) 3 LH/7.3SK SOPN, Inject 3 mg into the skin once a week., Disp: 4 pen, Rfl: 3 .  Eszopiclone 3 MG TABS, TAKE 1 TABLET BY MOUTH EVERY NIGHT AT BEDTIME AS NEEDED FOR SLEEP, Disp: 30 tablet, Rfl: 5 .  etodolac (LODINE) 400 MG tablet, Take 1 tablet (400 mg total) by mouth daily., Disp: 14 tablet, Rfl: 0 .  glucose blood (FREESTYLE LITE) test strip, USE TO CHECK BLOOD SUGAR ONCE DAILY, Disp: 100 strip, Rfl: 2 .  glucose monitoring kit (FREESTYLE) monitoring kit, 1 each by Does not apply route as needed for other. FREESTYLE LITE, Disp: 1 each, Rfl: 1 .  HYDROcodone-acetaminophen (NORCO) 10-325 MG tablet, Take 1 tablet by mouth every 6 (six) hours as needed. (Patient not taking: Reported on 12/16/2018), Disp: 30 tablet, Rfl: 0 .  JARDIANCE 25 MG TABS tablet, TAKE 1 TABLET BY MOUTH DAILY., Disp: 90 tablet, Rfl: 1 .  Lancets (FREESTYLE) lancets, USE TO CHECK BLOOD SUGAR ONCE DAILY, Disp: 100 each, Rfl: 2 .  metFORMIN (GLUCOPHAGE) 850 MG tablet, TAKE 1 TABLET BY  MOUTH  Three times daily, Disp: 90 tablet, Rfl: 1 .  methocarbamol (ROBAXIN) 500 MG tablet, Take 1 tablet (500 mg total) by mouth 4 (four) times daily., Disp: 30 tablet, Rfl: 0 .  pantoprazole (PROTONIX) 40 MG tablet, TAKE 1 TABLET BY MOUTH ONCE DAILY, Disp: 90 tablet, Rfl: 1 .  rosuvastatin (CRESTOR) 10 MG tablet, TAKE 1 TABLET BY MOUTH EVERY OTHER DAY., Disp: 45 tablet, Rfl: 1 .  telmisartan (MICARDIS) 80 MG tablet, TAKE 1 TABLET BY MOUTH DAILY., Disp: 90 tablet, Rfl: 1 .  terazosin (HYTRIN) 5 MG capsule, Take 5 mg by mouth daily., Disp: , Rfl:   Current Facility-Administered Medications:  .  0.9 %  sodium chloride infusion, 500 mL, Intravenous, Once, Carlean Purl Ofilia Neas, MD

## 2019-01-18 MED FILL — ESZOPICLONE 3 MG TABLET: 3 | 30 days supply | Qty: 30 | Fill #0

## 2019-01-18 NOTE — Telephone Encounter (Signed)
Refill sent.

## 2019-02-04 MED FILL — CELECOXIB 200 MG CAP: 200 | 90 days supply | Qty: 180 | Fill #1

## 2019-02-08 MED FILL — TRULICITY 3 MG/0.5ML SOPN: 3 | 28 days supply | Qty: 2 | Fill #2

## 2019-02-14 ENCOUNTER — Telehealth: Payer: Self-pay | Admitting: Internal Medicine

## 2019-02-14 ENCOUNTER — Other Ambulatory Visit: Payer: Self-pay

## 2019-02-14 ENCOUNTER — Other Ambulatory Visit: Payer: Self-pay | Admitting: *Deleted

## 2019-02-14 MED ORDER — AMBULATORY NON FORMULARY MEDICATION: 30.0000 g | Freq: Two times a day (BID) | 3 refills | Status: DC

## 2019-02-14 NOTE — Telephone Encounter (Signed)
We spoke on phone after initial text  Having intermittent anal discomfort and slight red blood on TP (lately) - has known fissure (colonoscopy 9/20)  Is on Fiber con and has added 2 docusate  Stools soft and easy  Plan:  We will call in Rx for  Diltiazem 2%/lidocaine 5% cream  50/50 mix  Insert into anal canal bid  # 30 g  Refill x 3  Call in to [facility]  Patient will call them later today tomorrow to see if ready and then pick up

## 2019-02-14 NOTE — Telephone Encounter (Signed)
Prescription orders and paper script faxed to [facility] as written below.

## 2019-02-17 MED FILL — ESZOPICLONE 3 MG TABS: 3 | 30 days supply | Qty: 30 | Fill #1

## 2019-02-17 MED FILL — TELMISARTAN 80 MG TABLET: 80 | 30 days supply | Qty: 30 | Fill #5

## 2019-02-28 ENCOUNTER — Other Ambulatory Visit: Payer: Self-pay | Admitting: Internal Medicine

## 2019-02-28 MED FILL — ALLOPURINOL 300 MG TABS: 300 | 90 days supply | Qty: 90 | Fill #0

## 2019-02-28 MED FILL — JARDIANCE 25 MG TABLET: 25 | 90 days supply | Qty: 90 | Fill #1

## 2019-02-28 MED FILL — PANTOPRAZOLE SOD DR 40 MG T: 40 | 90 days supply | Qty: 90 | Fill #0

## 2019-03-17 MED FILL — TRULICITY 3 MG/0.5ML SOPN: 3 | 28 days supply | Qty: 2 | Fill #3

## 2019-03-17 MED FILL — ESZOPICLONE 3 MG TABS: 3 | 30 days supply | Qty: 30 | Fill #2

## 2019-03-17 MED FILL — AMLODIPINE BESYLATE 10 MG T: 10 | 90 days supply | Qty: 90 | Fill #1

## 2019-03-23 DIAGNOSIS — E669 Obesity, unspecified: Secondary | ICD-10-CM

## 2019-03-23 DIAGNOSIS — E119 Type 2 diabetes mellitus without complications: Secondary | ICD-10-CM

## 2019-03-23 DIAGNOSIS — E538 Deficiency of other specified B group vitamins: Secondary | ICD-10-CM

## 2019-03-23 DIAGNOSIS — I152 Hypertension secondary to endocrine disorders: Secondary | ICD-10-CM

## 2019-03-23 DIAGNOSIS — R748 Abnormal levels of other serum enzymes: Secondary | ICD-10-CM

## 2019-03-24 ENCOUNTER — Encounter: Payer: Self-pay | Admitting: Internal Medicine

## 2019-03-24 DIAGNOSIS — K760 Fatty (change of) liver, not elsewhere classified: Secondary | ICD-10-CM | POA: Insufficient documentation

## 2019-03-24 MED ORDER — METFORMIN HCL 500 MG PO TABS: ORAL_TABLET | ORAL | 3 refills | Status: DC

## 2019-03-25 ENCOUNTER — Telehealth: Payer: Self-pay | Admitting: Internal Medicine

## 2019-03-25 ENCOUNTER — Other Ambulatory Visit: Payer: Self-pay | Admitting: Internal Medicine

## 2019-03-25 MED FILL — METFORMIN HCL 500 MG TABS: 500 | 30 days supply | Qty: 150 | Fill #0

## 2019-03-25 MED FILL — TELMISARTAN 80 MG TABLET: 80 | 30 days supply | Qty: 30 | Fill #0

## 2019-03-28 ENCOUNTER — Other Ambulatory Visit (INDEPENDENT_AMBULATORY_CARE_PROVIDER_SITE_OTHER): Payer: 59

## 2019-03-28 DIAGNOSIS — E1159 Type 2 diabetes mellitus with other circulatory complications: Secondary | ICD-10-CM

## 2019-03-28 DIAGNOSIS — E669 Obesity, unspecified: Secondary | ICD-10-CM | POA: Diagnosis not present

## 2019-03-28 DIAGNOSIS — E1169 Type 2 diabetes mellitus with other specified complication: Secondary | ICD-10-CM | POA: Diagnosis not present

## 2019-03-28 DIAGNOSIS — I10 Essential (primary) hypertension: Secondary | ICD-10-CM | POA: Diagnosis not present

## 2019-03-28 LAB — COMPREHENSIVE METABOLIC PANEL
ALT: 50 U/L (ref 0–53)
AST: 34 U/L (ref 0–37)
Albumin: 3.8 g/dL (ref 3.5–5.2)
Alkaline Phosphatase: 72 U/L (ref 39–117)
BUN: 18 mg/dL (ref 6–23)
CO2: 25 mEq/L (ref 19–32)
Calcium: 9.4 mg/dL (ref 8.4–10.5)
Chloride: 104 mEq/L (ref 96–112)
Creatinine, Ser: 0.97 mg/dL (ref 0.40–1.50)
GFR: 78.16 mL/min (ref >60.00)
Glucose, Bld: 155 mg/dL — ABNORMAL HIGH (ref 70–99)
Potassium: 4.4 mEq/L (ref 3.5–5.1)
Sodium: 140 mEq/L (ref 135–145)
Total Bilirubin: 0.4 mg/dL (ref 0.2–1.2)
Total Protein: 6.5 g/dL (ref 6.0–8.3)

## 2019-03-28 LAB — LIPID PANEL
Cholesterol: 124 mg/dL (ref 0–200)
HDL: 22.8 mg/dL — ABNORMAL LOW (ref >39.00)
NonHDL: 100.72
Total CHOL/HDL Ratio: 5
Triglycerides: 233 mg/dL — ABNORMAL HIGH (ref 0.0–149.0)
VLDL: 46.6 mg/dL — ABNORMAL HIGH (ref 0.0–40.0)

## 2019-03-28 LAB — MICROALBUMIN / CREATININE URINE RATIO
Creatinine,U: 68.4 mg/dL
Microalb Creat Ratio: 1 mg/g (ref 0.0–30.0)
Microalb, Ur: <0.7 mg/dL (ref 0.0–1.9)

## 2019-03-28 LAB — HEMOGLOBIN A1C: Hgb A1c MFr Bld: 8.1 % — ABNORMAL HIGH (ref 4.6–6.5)

## 2019-03-28 LAB — LDL CHOLESTEROL, DIRECT: Direct LDL: 76 mg/dL

## 2019-03-28 NOTE — Telephone Encounter (Signed)
err

## 2019-03-31 ENCOUNTER — Telehealth: Payer: Self-pay | Admitting: Internal Medicine

## 2019-03-31 NOTE — Telephone Encounter (Signed)
Spoke with pt and he has been scheduled for 04/11/2019 at 4:30pm.

## 2019-03-31 NOTE — Telephone Encounter (Signed)
Pt needs appt for lab f/u-does not want to wait until end of March but there are no appts available. Please advise.

## 2019-04-05 ENCOUNTER — Telehealth (INDEPENDENT_AMBULATORY_CARE_PROVIDER_SITE_OTHER): Payer: 59 | Admitting: Internal Medicine

## 2019-04-05 ENCOUNTER — Encounter: Payer: Self-pay | Admitting: Internal Medicine

## 2019-04-05 VITALS — BP 120/72 | HR 82 | Ht 70.0 in | Wt 290.0 lb

## 2019-04-05 DIAGNOSIS — E1159 Type 2 diabetes mellitus with other circulatory complications: Secondary | ICD-10-CM

## 2019-04-05 DIAGNOSIS — R748 Abnormal levels of other serum enzymes: Secondary | ICD-10-CM

## 2019-04-05 DIAGNOSIS — K760 Fatty (change of) liver, not elsewhere classified: Secondary | ICD-10-CM

## 2019-04-05 DIAGNOSIS — I10 Essential (primary) hypertension: Secondary | ICD-10-CM | POA: Diagnosis not present

## 2019-04-05 DIAGNOSIS — E1169 Type 2 diabetes mellitus with other specified complication: Secondary | ICD-10-CM | POA: Diagnosis not present

## 2019-04-05 DIAGNOSIS — E669 Obesity, unspecified: Secondary | ICD-10-CM | POA: Diagnosis not present

## 2019-04-05 DIAGNOSIS — E1165 Type 2 diabetes mellitus with hyperglycemia: Secondary | ICD-10-CM | POA: Diagnosis not present

## 2019-04-05 MED ORDER — TRULICITY 3 MG/0.5ML ~~LOC~~ SOAJ: 3.0000 mg | SUBCUTANEOUS | 3 refills | Status: DC

## 2019-04-05 MED ORDER — GLIMEPIRIDE 1 MG PO TABS: 1.0000 mg | ORAL_TABLET | Freq: Every day | ORAL | 1 refills | Status: DC

## 2019-04-05 MED FILL — GLIMEPIRIDE 1 MG TABLET: 1 | 90 days supply | Qty: 90 | Fill #0

## 2019-04-05 NOTE — Progress Notes (Signed)
Virtual Visit via [place]   This visit type was conducted due to national recommendations for restrictions regarding the COVID-19 pandemic (e.g. social distancing).  This format is felt to be most appropriate for this patient at this time.  All issues noted in this document were discussed and addressed.  No physical exam was performed (except for noted visual exam findings with Video Visits).   I connected with@ on 04/05/19 at 12:30 PM EDT by a video enabled telemedicine application  and verified that I am speaking with the correct person using two identifiers. Location patient: home Location provider: work or home office Persons participating in the virtual visit: patient, provider  I discussed the limitations, risks, security and privacy concerns of performing an evaluation and management service by telephone and the availability of in person appointments. I also discussed with the patient that there may be a patient responsible charge related to this service. The patient expressed understanding and agreed to proceed.   Reason for visit:  Follow up  HPI:  64 yr old male with uncontrolled type 2 DM,  Hypertension,  Hyperlipidemia and hepatic steatosis by prior CT scan,  Presents for follow up.     T2DM:  hefeels generally well,  But is not  exercising regularly or trying to lose weight. Checking  blood sugars twice daily and have been elevated in the mornings and in range post prandially Denies any recent hypoglyemic events.  Taking   medications as directed including Trulicity 3 mg dose.  Following a carbohydrate modified diet 6 days per week. Denies numbness, burning and tingling of extremities. Appetite is good.   Hypertension: patient checks blood pressure twice weekly at home.  Readings have been for the most part < 140/80 at rest . Patient is following a reduced salt diet most days and is taking medications as prescribed  ROS: See pertinent positives and negatives per HPI.  Past  Medical History:  Diagnosis Date  . Arthritis   . 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 Diabetes mellitus, type 2 ([place])   . Diverticulosis    At Age 55 on colonoscopy  . GERD (gastroesophageal reflux disease)   . Gout   . Hyperlipemia   . Hypertension   . Insomnia 04/27/2010  . Nephrolithiasis   . Obstructive sleep apnea 04/27/2010  . OSA on CPAP   . Reactive airway disease   . Sleep apnea    wears cpap   . Tinea cruris     Past Surgical History:  Procedure Laterality Date  . COLONOSCOPY     last 11 yrs   . cystoscopy  09/2008   & stone removal  . CYSTOSCOPY/URETEROSCOPY/HOLMIUM LASER/STENT PLACEMENT Right 08/19/2017   Procedure: CYSTOSCOPY/RIGHT RETROGRADE RIGHT URETEROSCOPY/ RIGHT URETERAL STENT PLACEMENT;  Surgeon: Raynelle Bring, MD;  Location: [facility];  Service: Urology;  Laterality: Right;  . SURGERY SCROTAL / TESTICULAR N/A 1966   for undescended testicle    Family History  Problem Relation Age of Onset  . Heart disease Mother   . Deep vein thrombosis Father   . Colon cancer Neg Hx   . Colon polyps Neg Hx   . Esophageal cancer Neg Hx   . Rectal cancer Neg Hx   . Stomach cancer Neg Hx     SOCIAL HX:  reports that he has never smoked. He has never used smokeless tobacco. He reports that he does not drink alcohol or use drugs.   Current Outpatient Medications:  .  allopurinol (ZYLOPRIM) 300 MG tablet, TAKE 1  TABLET BY MOUTH ONCE DAILY, Disp: 90 tablet, Rfl: 1 .  AMBULATORY NON FORMULARY MEDICATION, Place 30 g rectally 2 (two) times daily. Medication Name: Diltiazem 2% / lidocaine 5%  Cream 50/50 mix  Insert into anal canal twice daily, Disp: 30 g, Rfl: 3 .  amLODipine (NORVASC) 10 MG tablet, Take 1 tablet (10 mg total) by mouth daily., Disp: 90 tablet, Rfl: 1 .  aspirin 81 MG tablet, Take 81 mg by mouth every other day. , Disp: , Rfl:  .  celecoxib (CELEBREX) 200 MG capsule, TAKE 1 CAPSULE BY MOUTH 2 TIMES DAILY., Disp: 180 capsule, Rfl: 1 .  Dulaglutide (TRULICITY) 3 GE/3.6OQ  SOPN, Inject 3 mg into the skin once a week., Disp: 4 pen, Rfl: 3 .  Eszopiclone 3 MG TABS, TAKE 1 TABLET BY MOUTH EVERY NIGHT AT BEDTIME AS NEEDED FOR SLEEP, Disp: 30 tablet, Rfl: 5 .  glucose blood (FREESTYLE LITE) test strip, USE TO CHECK BLOOD SUGAR ONCE DAILY, Disp: 100 strip, Rfl: 2 .  glucose monitoring kit (FREESTYLE) monitoring kit, 1 each by Does not apply route as needed for other. FREESTYLE LITE, Disp: 1 each, Rfl: 1 .  HYDROcodone-acetaminophen (NORCO) 10-325 MG tablet, Take 1 tablet by mouth every 6 (six) hours as needed., Disp: 30 tablet, Rfl: 0 .  JARDIANCE 25 MG TABS tablet, TAKE 1 TABLET BY MOUTH DAILY., Disp: 90 tablet, Rfl: 1 .  Lancets (FREESTYLE) lancets, USE TO CHECK BLOOD SUGAR ONCE DAILY, Disp: 100 each, Rfl: 2 .  metFORMIN (GLUCOPHAGE) 500 MG tablet, 2 tablets twice daily,  1 at lunch, Disp: 150 tablet, Rfl: 3 .  pantoprazole (PROTONIX) 40 MG tablet, TAKE 1 TABLET BY MOUTH ONCE DAILY, Disp: 90 tablet, Rfl: 1 .  rosuvastatin (CRESTOR) 10 MG tablet, TAKE 1 TABLET BY MOUTH EVERY OTHER DAY., Disp: 45 tablet, Rfl: 1 .  telmisartan (MICARDIS) 80 MG tablet, TAKE 1 TABLET BY MOUTH ONCE A DAY, Disp: 30 tablet, Rfl: 1 .  terazosin (HYTRIN) 5 MG capsule, Take 5 mg by mouth daily., Disp: , Rfl:  .  glimepiride (AMARYL) 1 MG tablet, Take 1 tablet (1 mg total) by mouth daily. With dinner, Disp: 90 tablet, Rfl: 1  Current Facility-Administered Medications:  .  0.9 %  sodium chloride infusion, 500 mL, Intravenous, Once, Gatha Mayer, MD  EXAM:  VITALS per patient if applicable:  GENERAL: alert, oriented, appears well and in no acute distress  HEENT: atraumatic, conjunttiva clear, no obvious abnormalities on inspection of external nose and ears  NECK: normal movements of the head and neck  LUNGS: on inspection no signs of respiratory distress, breathing rate appears normal, no obvious gross SOB, gasping or wheezing  CV: no obvious cyanosis  MS: moves all visible extremities  without noticeable abnormality  PSYCH/NEURO: pleasant and cooperative, no obvious depression or anxiety, speech and thought processing grossly intact  ASSESSMENT AND PLAN:  Discussed the following assessment and plan:  Elevated liver enzymes - Plan: ANA, Anti-Smith antibody, Hepatitis A Ab, Total, Hepatitis B core antibody, total, Hepatitis B surface antigen, Hepatitis C antibody, IBC + Ferritin, Mitochondrial antibodies, CANCELED: ANA, CANCELED: Anti-Smith antibody, CANCELED: Hepatitis B core antibody, total, CANCELED: Hepatitis B surface antibody,qualitative, CANCELED: Hepatitis C antibody, CANCELED: Hepatitis B surface antigen, CANCELED: IBC + Ferritin, CANCELED: Mitochondrial antibodies, CANCELED: Hepatitis A Ab, Total  Uncontrolled type 2 diabetes mellitus with hyperglycemia (HCC)  Obesity, diabetes, and hypertension syndrome (HCC)  Hepatic steatosis  Essential hypertension  Uncontrolled type 2 diabetes mellitus (HCC) A1c improved  but not at goal with addition of 3 mg Trulicity. Metformin and jardiance are alsobeing utilized and are  on maximal doses .  Adding 32m amaryl at dinnertime  Lab Results  Component Value Date   HGBA1C 8.1 (H) 03/28/2019   Lab Results  Component Value Date   MICROALBUR <0.7 03/28/2019   MICROALBUR <0.7 08/25/2018       Obesity, diabetes, and hypertension syndrome ([place] A1c not at goal.  Adding amaryl.  Exercise encouraged.   Hepatic steatosis Suggested by CT scan in 2012 ,  With recent enzyme elevation.  Ruling out additional causes of liver enzyme elevation   Essential hypertension Well controlled on current regimen. Renal function stable, no changes today.    I discussed the assessment and treatment plan with the patient. The patient was provided an opportunity to ask questions and all were answered. The patient agreed with the plan and demonstrated an understanding of the instructions.   The patient was advised to call back or seek an  in-person evaluation if the symptoms worsen or if the condition fails to improve as anticipated.   I provided  30 minutes of  face-to-face time during this encounter reviewing patient's current problems and past surgeries, labs and imaging studies, providing counseling on the above mentioned problems , and coordination  of care .   TCrecencio Mc MD

## 2019-04-06 NOTE — Assessment & Plan Note (Signed)
A1c not at goal.  Adding amaryl.  Exercise encouraged.

## 2019-04-06 NOTE — Assessment & Plan Note (Addendum)
A1c improved but not at goal with addition of 3 mg Trulicity. Metformin and jardiance are alsobeing utilized and are  on maximal doses .  Adding 1mg  amaryl at dinnertime  Lab Results  Component Value Date   HGBA1C 8.1 (H) 03/28/2019   Lab Results  Component Value Date   MICROALBUR <0.7 03/28/2019   MICROALBUR <0.7 08/25/2018

## 2019-04-07 NOTE — Assessment & Plan Note (Signed)
Suggested by CT scan in 2012 ,  With recent enzyme elevation.  Ruling out additional causes of liver enzyme elevation

## 2019-04-07 NOTE — Assessment & Plan Note (Signed)
Well controlled on current regimen. Renal function stable, no changes today. 

## 2019-04-11 ENCOUNTER — Ambulatory Visit: Payer: 59 | Admitting: Internal Medicine

## 2019-04-12 ENCOUNTER — Other Ambulatory Visit: Payer: 59

## 2019-04-12 DIAGNOSIS — R748 Abnormal levels of other serum enzymes: Secondary | ICD-10-CM | POA: Diagnosis not present

## 2019-04-12 LAB — IBC + FERRITIN
Ferritin: 102.4 ng/mL (ref 22.0–322.0)
Iron: 72 ug/dL (ref 42–165)
Saturation Ratios: 18.4 % — ABNORMAL LOW (ref 20.0–50.0)
Transferrin: 279 mg/dL (ref 212.0–360.0)

## 2019-04-12 NOTE — Addendum Note (Signed)
Addended by: Trenda Moots on: [id number] 08:12 AM   Modules accepted: Orders

## 2019-04-13 LAB — ANA: Anti Nuclear Antibody (ANA): NEGATIVE

## 2019-04-13 MED FILL — TRULICITY 3 MG/0.5ML SOPN: 3 | 28 days supply | Qty: 2 | Fill #0

## 2019-04-15 LAB — HEPATITIS B SURFACE ANTIGEN: Hepatitis B Surface Ag: NONREACTIVE

## 2019-04-15 LAB — HEPATITIS A ANTIBODY, TOTAL: Hepatitis A AB,Total: NONREACTIVE

## 2019-04-15 LAB — HEPATITIS C ANTIBODY
Hepatitis C Ab: NONREACTIVE
SIGNAL TO CUT-OFF: 0.01 (ref <1.00)

## 2019-04-15 LAB — ANTI-SMITH ANTIBODY: ENA SM Ab Ser-aCnc: <1 AI

## 2019-04-15 LAB — MITOCHONDRIAL ANTIBODIES: Mitochondrial M2 Ab, IgG: <=20 U

## 2019-04-15 LAB — HEPATITIS B CORE ANTIBODY, TOTAL: Hep B Core Total Ab: NONREACTIVE

## 2019-04-18 MED FILL — METFORMIN HCL 500 MG TABS: 500 | 30 days supply | Qty: 150 | Fill #1

## 2019-04-18 MED FILL — TELMISARTAN 80 MG TABLET: 80 | 30 days supply | Qty: 30 | Fill #1

## 2019-04-18 MED FILL — ESZOPICLONE 3 MG TABS: 3 | 30 days supply | Qty: 30 | Fill #3

## 2019-04-19 ENCOUNTER — Telehealth: Payer: 59 | Admitting: Internal Medicine

## 2019-04-19 ENCOUNTER — Other Ambulatory Visit (HOSPITAL_COMMUNITY): Payer: Self-pay | Admitting: Urology

## 2019-04-19 MED FILL — TERAZOSIN 5 MG CAPSULE: 5 | 90 days supply | Qty: 90 | Fill #0

## 2019-05-09 MED FILL — FREESTYLE LITE TEST STRIP: 90 days supply | Qty: 100 | Fill #2

## 2019-05-09 MED FILL — TRULICITY 3 MG/0.5ML SOPN: 3 | 28 days supply | Qty: 2 | Fill #1

## 2019-05-11 DIAGNOSIS — G4733 Obstructive sleep apnea (adult) (pediatric): Secondary | ICD-10-CM | POA: Diagnosis not present

## 2019-05-23 ENCOUNTER — Other Ambulatory Visit: Payer: Self-pay | Admitting: Internal Medicine

## 2019-06-06 ENCOUNTER — Other Ambulatory Visit: Payer: Self-pay | Admitting: Internal Medicine

## 2019-06-06 MED FILL — TRULICITY 3 MG/0.5ML SOPN: 3 | 28 days supply | Qty: 2 | Fill #2

## 2019-06-06 MED FILL — FREESTYLE LANCETS: 90 days supply | Qty: 100 | Fill #0

## 2019-06-06 MED FILL — CELECOXIB 200 MG CAP: 200 | 90 days supply | Qty: 180 | Fill #0

## 2019-06-21 ENCOUNTER — Other Ambulatory Visit: Payer: Self-pay | Admitting: Internal Medicine

## 2019-07-04 MED FILL — TRULICITY 3 MG/0.5ML SOPN: 3 | 28 days supply | Qty: 2 | Fill #3

## 2019-07-04 MED FILL — GLIMEPIRIDE 1 MG TABLET: 1 | 90 days supply | Qty: 90 | Fill #1

## 2019-07-18 MED FILL — TERAZOSIN 5 MG CAPSULE: 5 | 90 days supply | Qty: 90 | Fill #1

## 2019-07-19 ENCOUNTER — Other Ambulatory Visit: Payer: Self-pay | Admitting: Internal Medicine

## 2019-07-19 MED FILL — TELMISARTAN 80 MG TABS: 80 | 30 days supply | Qty: 30 | Fill #0

## 2019-07-22 DIAGNOSIS — E1165 Type 2 diabetes mellitus with hyperglycemia: Secondary | ICD-10-CM

## 2019-07-22 DIAGNOSIS — I10 Essential (primary) hypertension: Secondary | ICD-10-CM

## 2019-07-22 DIAGNOSIS — E785 Hyperlipidemia, unspecified: Secondary | ICD-10-CM

## 2019-07-22 NOTE — Telephone Encounter (Signed)
I ordered an A1c, CMP, and lipid panel. Is there anything lese that needs to be ordered?

## 2019-08-01 ENCOUNTER — Other Ambulatory Visit: Payer: Self-pay | Admitting: Internal Medicine

## 2019-08-01 MED FILL — TRULICITY 3 MG/0.5ML SOPN: 3 | 28 days supply | Qty: 2 | Fill #0

## 2019-08-15 ENCOUNTER — Other Ambulatory Visit (INDEPENDENT_AMBULATORY_CARE_PROVIDER_SITE_OTHER): Payer: 59

## 2019-08-15 DIAGNOSIS — E785 Hyperlipidemia, unspecified: Secondary | ICD-10-CM

## 2019-08-15 DIAGNOSIS — I10 Essential (primary) hypertension: Secondary | ICD-10-CM | POA: Diagnosis not present

## 2019-08-15 DIAGNOSIS — E1165 Type 2 diabetes mellitus with hyperglycemia: Secondary | ICD-10-CM | POA: Diagnosis not present

## 2019-08-15 LAB — HEMOGLOBIN A1C: Hgb A1c MFr Bld: 7.7 % — ABNORMAL HIGH (ref 4.6–6.5)

## 2019-08-15 LAB — COMPREHENSIVE METABOLIC PANEL
ALT: 51 U/L (ref 0–53)
AST: 29 U/L (ref 0–37)
Albumin: 3.9 g/dL (ref 3.5–5.2)
Alkaline Phosphatase: 60 U/L (ref 39–117)
BUN: 17 mg/dL (ref 6–23)
CO2: 27 mEq/L (ref 19–32)
Calcium: 9.6 mg/dL (ref 8.4–10.5)
Chloride: 105 mEq/L (ref 96–112)
Creatinine, Ser: 0.99 mg/dL (ref 0.40–1.50)
GFR: 76.25 mL/min (ref >60.00)
Glucose, Bld: 134 mg/dL — ABNORMAL HIGH (ref 70–99)
Potassium: 3.9 mEq/L (ref 3.5–5.1)
Sodium: 139 mEq/L (ref 135–145)
Total Bilirubin: 0.4 mg/dL (ref 0.2–1.2)
Total Protein: 6.8 g/dL (ref 6.0–8.3)

## 2019-08-15 LAB — LIPID PANEL
Cholesterol: 121 mg/dL (ref 0–200)
HDL: 23.9 mg/dL — ABNORMAL LOW (ref >39.00)
LDL Cholesterol: 68 mg/dL (ref 0–99)
NonHDL: 96.61
Total CHOL/HDL Ratio: 5
Triglycerides: 143 mg/dL (ref 0.0–149.0)
VLDL: 28.6 mg/dL (ref 0.0–40.0)

## 2019-08-16 ENCOUNTER — Other Ambulatory Visit: Payer: Self-pay | Admitting: Internal Medicine

## 2019-08-16 MED ORDER — ALLOPURINOL 300 MG PO TABS: 300.0000 mg | ORAL_TABLET | Freq: Every day | ORAL | 3 refills | Status: DC

## 2019-08-16 MED ORDER — METFORMIN HCL 500 MG PO TABS: ORAL_TABLET | ORAL | 3 refills | Status: DC

## 2019-08-16 MED ORDER — PANTOPRAZOLE SODIUM 40 MG PO TBEC: 40.0000 mg | DELAYED_RELEASE_TABLET | Freq: Every day | ORAL | 3 refills | Status: DC

## 2019-08-16 MED ORDER — FREESTYLE LITE TEST VI STRP: ORAL_STRIP | 2 refills | Status: DC

## 2019-08-16 MED FILL — METFORMIN HCL 500 MG TABS: 500 | 30 days supply | Qty: 150 | Fill #0

## 2019-08-16 MED FILL — TELMISARTAN 80 MG TABS: 80 | 30 days supply | Qty: 30 | Fill #1

## 2019-08-16 MED FILL — FREESTYLE LITE TEST STRIP: 90 days supply | Qty: 100 | Fill #0

## 2019-08-16 MED FILL — ROSUVASTATIN CALCIUM 10 MG: 10 | 90 days supply | Qty: 45 | Fill #1

## 2019-08-16 MED FILL — PANTOPRAZOLE SOD DR 40 MG T: 40 | 90 days supply | Qty: 90 | Fill #0

## 2019-08-16 MED FILL — JARDIANCE 25 MG TABLET: 25 | 90 days supply | Qty: 90 | Fill #1

## 2019-08-16 MED FILL — ALLOPURINOL 300 MG TABS: 300 | 90 days supply | Qty: 90 | Fill #0

## 2019-08-16 NOTE — Telephone Encounter (Signed)
Refilled medications

## 2019-08-20 ENCOUNTER — Other Ambulatory Visit: Payer: Self-pay | Admitting: Internal Medicine

## 2019-08-20 DIAGNOSIS — I10 Essential (primary) hypertension: Secondary | ICD-10-CM

## 2019-08-20 DIAGNOSIS — K760 Fatty (change of) liver, not elsewhere classified: Secondary | ICD-10-CM

## 2019-08-20 DIAGNOSIS — E785 Hyperlipidemia, unspecified: Secondary | ICD-10-CM

## 2019-08-20 DIAGNOSIS — E669 Obesity, unspecified: Secondary | ICD-10-CM

## 2019-08-20 DIAGNOSIS — Z125 Encounter for screening for malignant neoplasm of prostate: Secondary | ICD-10-CM

## 2019-08-24 ENCOUNTER — Encounter: Payer: Self-pay | Admitting: Internal Medicine

## 2019-08-25 ENCOUNTER — Other Ambulatory Visit: Payer: Self-pay | Admitting: Internal Medicine

## 2019-08-25 ENCOUNTER — Ambulatory Visit: Payer: 59 | Admitting: Internal Medicine

## 2019-08-25 ENCOUNTER — Encounter: Payer: Self-pay | Admitting: Internal Medicine

## 2019-08-25 ENCOUNTER — Other Ambulatory Visit: Payer: Self-pay

## 2019-08-25 DIAGNOSIS — G4733 Obstructive sleep apnea (adult) (pediatric): Secondary | ICD-10-CM | POA: Diagnosis not present

## 2019-08-25 DIAGNOSIS — F5101 Primary insomnia: Secondary | ICD-10-CM

## 2019-08-25 MED ORDER — ESZOPICLONE 3 MG PO TABS: 3.0000 mg | ORAL_TABLET | Freq: Every evening | ORAL | 5 refills | Status: DC | PRN

## 2019-08-25 NOTE — Patient Instructions (Signed)
  We can continue CPAP auto 5-20  Script printed for Lunesta    Please call if we can help

## 2019-08-25 NOTE — Progress Notes (Signed)
   Patient ID: Christopher Stewart, male    DOB: September 15, 1955, 64 y.o.   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  Lunesta 3 mg -----OSA on CPAP 5-20, DME: [facility]; using CPAP nightly, no complaints Sleeping ok off Lunesta and prefers to minimize meds. Sleep hygiene discussed.   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 Sufficient. Discussed.   ROS-see HPI   + = positive Constitutional:   No-   weight loss, night sweats, fevers, chills, fatigue, lassitude. HEENT:   No-  headaches, difficulty swallowing, tooth/dental problems, sore throat,       No-  sneezing, itching, ear ache, nasal congestion, post nasal drip,  CV:  No-   chest pain, orthopnea, PND, swelling in lower extremities, anasarca, dizziness, palpitations Resp: No-   shortness of breath with exertion or at rest.              No-   productive cough,  No non-productive cough,  No- coughing up of blood.              No-   change in color of mucus.  No- wheezing.   Skin: No-   rash or lesions. GI:  No-   heartburn, indigestion, abdominal pain, nausea, vomiting,  GU:  MS:  No-   joint pain or swelling.   Neuro-     nothing unusual Psych:  No- change in mood or affect. No depression or anxiety.  No memory loss.  OBJ- Physical Exam General- Alert, Oriented,  Affect-appropriate, Distress- none acute, + obese Skin- rash-none, lesions- none, excoriation- none Lymphadenopathy- none Head- atraumatic            Eyes- Gross vision intact, PERRLA, conjunctivae and secretions clear            Ears- Hearing, canals-normal            Nose- Clear, no-Septal dev, mucus, polyps, erosion, perforation             Throat- Mallampati II-III , mucosa clear , drainage- none, tonsils- atrophic Neck- flexible , trachea midline, no stridor , thyroid nl, carotid no bruit Chest - symmetrical excursion , unlabored           Heart/CV- RRR , no murmur , no gallop  , no rub, nl s1 s2                           - JVD- none , edema- none, stasis changes- none, varices- none           Lung- clear to P&A, wheeze- none, cough- none , dullness-none, rub- none           Chest wall-  Abd-  Br/ Gen/ Rectal- Not done, not indicated Extrem- cyanosis- none, clubbing, none, atrophy- none, strength- nl Neuro- grossly intact to observation

## 2019-08-29 MED FILL — TRULICITY 3 MG/0.5ML SOPN: 3 | 28 days supply | Qty: 2 | Fill #1

## 2019-09-02 MED FILL — ESZOPICLONE 3 MG TABS: 3 | 30 days supply | Qty: 30 | Fill #0

## 2019-09-04 NOTE — Assessment & Plan Note (Signed)
Lunest works well when needed. Discussed use.

## 2019-09-04 NOTE — Assessment & Plan Note (Signed)
Benefits from CPAP- Plan continue auto 5-20 

## 2019-09-13 MED FILL — METFORMIN HCL 500 MG TABS: 500 | 30 days supply | Qty: 150 | Fill #1

## 2019-09-13 MED FILL — AMLODIPINE BESYLATE 10 MG T: 10 | 90 days supply | Qty: 90 | Fill #1

## 2019-09-13 MED FILL — CELECOXIB 200 MG CAP: 200 | 90 days supply | Qty: 180 | Fill #1

## 2019-09-20 ENCOUNTER — Other Ambulatory Visit: Payer: Self-pay | Admitting: Internal Medicine

## 2019-09-20 MED FILL — TELMISARTAN 80 MG TABS: 80 | 60 days supply | Qty: 60 | Fill #0

## 2019-09-28 MED FILL — TRULICITY 3 MG/0.5ML SOPN: 3 | 28 days supply | Qty: 2 | Fill #2

## 2019-10-03 ENCOUNTER — Other Ambulatory Visit: Payer: Self-pay | Admitting: Internal Medicine

## 2019-10-03 MED FILL — ESZOPICLONE 3 MG TABS: 3 | 30 days supply | Qty: 30 | Fill #1

## 2019-10-03 MED FILL — GLIMEPIRIDE 1 MG TABLET: 1 | 90 days supply | Qty: 90 | Fill #0

## 2019-10-10 MED FILL — TERAZOSIN 5 MG CAPSULE: 5 | 90 days supply | Qty: 90 | Fill #2

## 2019-10-10 MED FILL — METFORMIN HCL 500 MG TABS: 500 | 30 days supply | Qty: 150 | Fill #2

## 2019-10-24 ENCOUNTER — Other Ambulatory Visit: Payer: Self-pay | Admitting: Internal Medicine

## 2019-10-24 MED FILL — TRULICITY 3 MG/0.5ML SOPN: 3 | 84 days supply | Qty: 6 | Fill #0

## 2019-10-31 MED FILL — ESZOPICLONE 3 MG TABS: 3 | 30 days supply | Qty: 30 | Fill #2

## 2019-11-04 DIAGNOSIS — G4733 Obstructive sleep apnea (adult) (pediatric): Secondary | ICD-10-CM | POA: Diagnosis not present

## 2019-11-08 MED FILL — ALLOPURINOL 300 MG TABS: 300 | 90 days supply | Qty: 90 | Fill #1

## 2019-11-08 MED FILL — METFORMIN HCL 500 MG TABS: 500 | 30 days supply | Qty: 150 | Fill #3

## 2019-11-08 MED FILL — PANTOPRAZOLE SOD DR 40 MG T: 40 | 90 days supply | Qty: 90 | Fill #1

## 2019-11-14 DIAGNOSIS — H10413 Chronic giant papillary conjunctivitis, bilateral: Secondary | ICD-10-CM | POA: Diagnosis not present

## 2019-11-14 DIAGNOSIS — H5212 Myopia, left eye: Secondary | ICD-10-CM | POA: Diagnosis not present

## 2019-11-14 DIAGNOSIS — E119 Type 2 diabetes mellitus without complications: Secondary | ICD-10-CM | POA: Diagnosis not present

## 2019-11-14 DIAGNOSIS — H04123 Dry eye syndrome of bilateral lacrimal glands: Secondary | ICD-10-CM | POA: Diagnosis not present

## 2019-11-14 LAB — HM DIABETES EYE EXAM

## 2019-11-17 ENCOUNTER — Other Ambulatory Visit: Payer: Self-pay | Admitting: Internal Medicine

## 2019-11-17 MED FILL — JARDIANCE 25 MG TABLET: 25 | 90 days supply | Qty: 90 | Fill #0

## 2019-11-17 MED FILL — TELMISARTAN 80 MG TABS: 80 | 60 days supply | Qty: 60 | Fill #0

## 2019-11-17 MED FILL — ROSUVASTATIN CALCIUM 10 MG: 10 | 90 days supply | Qty: 45 | Fill #0

## 2019-12-05 ENCOUNTER — Other Ambulatory Visit (INDEPENDENT_AMBULATORY_CARE_PROVIDER_SITE_OTHER): Payer: 59

## 2019-12-05 ENCOUNTER — Other Ambulatory Visit: Payer: Self-pay

## 2019-12-05 DIAGNOSIS — E1159 Type 2 diabetes mellitus with other circulatory complications: Secondary | ICD-10-CM

## 2019-12-05 DIAGNOSIS — I152 Hypertension secondary to endocrine disorders: Secondary | ICD-10-CM

## 2019-12-05 DIAGNOSIS — K760 Fatty (change of) liver, not elsewhere classified: Secondary | ICD-10-CM

## 2019-12-05 DIAGNOSIS — E1169 Type 2 diabetes mellitus with other specified complication: Secondary | ICD-10-CM

## 2019-12-05 DIAGNOSIS — Z125 Encounter for screening for malignant neoplasm of prostate: Secondary | ICD-10-CM | POA: Diagnosis not present

## 2019-12-05 DIAGNOSIS — E785 Hyperlipidemia, unspecified: Secondary | ICD-10-CM | POA: Diagnosis not present

## 2019-12-05 DIAGNOSIS — E669 Obesity, unspecified: Secondary | ICD-10-CM | POA: Diagnosis not present

## 2019-12-05 LAB — LIPID PANEL
Cholesterol: 107 mg/dL (ref 0–200)
HDL: 25 mg/dL — ABNORMAL LOW (ref >39.00)
LDL Cholesterol: 44 mg/dL (ref 0–99)
NonHDL: 82.23
Total CHOL/HDL Ratio: 4
Triglycerides: 190 mg/dL — ABNORMAL HIGH (ref 0.0–149.0)
VLDL: 38 mg/dL (ref 0.0–40.0)

## 2019-12-05 LAB — COMPREHENSIVE METABOLIC PANEL
ALT: 56 U/L — ABNORMAL HIGH (ref 0–53)
AST: 36 U/L (ref 0–37)
Albumin: 3.7 g/dL (ref 3.5–5.2)
Alkaline Phosphatase: 65 U/L (ref 39–117)
BUN: 18 mg/dL (ref 6–23)
CO2: 25 mEq/L (ref 19–32)
Calcium: 9.4 mg/dL (ref 8.4–10.5)
Chloride: 108 mEq/L (ref 96–112)
Creatinine, Ser: 1.03 mg/dL (ref 0.40–1.50)
GFR: 77.2 mL/min (ref >60.00)
Glucose, Bld: 129 mg/dL — ABNORMAL HIGH (ref 70–99)
Potassium: 4 mEq/L (ref 3.5–5.1)
Sodium: 142 mEq/L (ref 135–145)
Total Bilirubin: 0.5 mg/dL (ref 0.2–1.2)
Total Protein: 6.2 g/dL (ref 6.0–8.3)

## 2019-12-05 LAB — CBC WITH DIFFERENTIAL/PLATELET
Basophils Absolute: 0.1 10*3/uL (ref 0.0–0.1)
Basophils Relative: 0.9 % (ref 0.0–3.0)
Eosinophils Absolute: 0.4 10*3/uL (ref 0.0–0.7)
Eosinophils Relative: 5.9 % — ABNORMAL HIGH (ref 0.0–5.0)
HCT: 41 % (ref 39.0–52.0)
Hemoglobin: 13.6 g/dL (ref 13.0–17.0)
Lymphocytes Relative: 30.2 % (ref 12.0–46.0)
Lymphs Abs: 1.9 10*3/uL (ref 0.7–4.0)
MCHC: 33.1 g/dL (ref 30.0–36.0)
MCV: 88 fl (ref 78.0–100.0)
Monocytes Absolute: 0.6 10*3/uL (ref 0.1–1.0)
Monocytes Relative: 9.3 % (ref 3.0–12.0)
Neutro Abs: 3.4 10*3/uL (ref 1.4–7.7)
Neutrophils Relative %: 53.7 % (ref 43.0–77.0)
Platelets: 270 10*3/uL (ref 150.0–400.0)
RBC: 4.66 Mil/uL (ref 4.22–5.81)
RDW: 16.2 % — ABNORMAL HIGH (ref 11.5–15.5)
WBC: 6.4 10*3/uL (ref 4.0–10.5)

## 2019-12-05 LAB — PSA: PSA: 0.33 ng/mL (ref 0.10–4.00)

## 2019-12-05 LAB — HEMOGLOBIN A1C: Hgb A1c MFr Bld: 7.5 % — ABNORMAL HIGH (ref 4.6–6.5)

## 2019-12-05 MED FILL — ESZOPICLONE 3 MG TABS: 3 | 30 days supply | Qty: 30 | Fill #3

## 2019-12-07 ENCOUNTER — Ambulatory Visit (INDEPENDENT_AMBULATORY_CARE_PROVIDER_SITE_OTHER): Payer: 59 | Admitting: Internal Medicine

## 2019-12-07 ENCOUNTER — Other Ambulatory Visit: Payer: Self-pay

## 2019-12-07 ENCOUNTER — Encounter: Payer: Self-pay | Admitting: Internal Medicine

## 2019-12-07 DIAGNOSIS — E785 Hyperlipidemia, unspecified: Secondary | ICD-10-CM | POA: Diagnosis not present

## 2019-12-07 DIAGNOSIS — I10 Essential (primary) hypertension: Secondary | ICD-10-CM | POA: Diagnosis not present

## 2019-12-07 DIAGNOSIS — K760 Fatty (change of) liver, not elsewhere classified: Secondary | ICD-10-CM | POA: Diagnosis not present

## 2019-12-07 DIAGNOSIS — E1165 Type 2 diabetes mellitus with hyperglycemia: Secondary | ICD-10-CM

## 2019-12-07 DIAGNOSIS — Z0000 Encounter for general adult medical examination without abnormal findings: Secondary | ICD-10-CM

## 2019-12-07 MED ORDER — ZOSTER VAC RECOMB ADJUVANTED 50 MCG/0.5ML IM SUSR: 0.5000 mL | Freq: Once | INTRAMUSCULAR | 1 refills | Status: AC

## 2019-12-07 NOTE — Progress Notes (Signed)
Patient ID: Christopher Stewart, male    DOB: [birth year]  Age: 64 y.o. MRN: [id number]  The patient is here for annual preventive  examination and management of other chronic and acute problems.   The risk factors are reflected in the social history.  The roster of all physicians providing medical care to patient - is listed in the Snapshot section of the chart.  Activities of daily living:  The patient is 100% independent in all ADLs: dressing, toileting, feeding as well as independent mobility  Home safety : The patient has smoke detectors in the home. They wear seatbelts.  There are no firearms at home. There is no violence in the home.   There is no risks for hepatitis, STDs or HIV. There is no   history of blood transfusion. They have no travel history to infectious disease endemic areas of the world.  The patient has seen their dentist in the last six month. They have seen their eye doctor in the last year. They admit to slight hearing difficulty with regard to whispered voices and some television programs.  They have deferred audiologic testing in the last year.  They do not  have excessive sun exposure. Discussed the need for sun protection: hats, long sleeves and use of sunscreen if there is significant sun exposure.   Diet: the importance of a healthy diet is discussed. They do have a healthy diet.  The benefits of regular aerobic exercise were discussed. She walks 4 times per week ,  20 minutes.   Depression screen: there are no signs or vegative symptoms of depression- irritability, change in appetite, anhedonia, sadness/tearfullness.  Cognitive assessment: the patient manages all their financial and personal affairs and is actively engaged. They could relate day,date,year and events; recalled 3/3 objects at 3 minutes; performed clock-face test normally.  The following portions of the patient's history were reviewed and updated as appropriate: allergies, current medications, past  family history, past medical history,  past surgical history, past social history  and problem list.  Visual acuity was not assessed per patient preference since she has regular follow up with her ophthalmologist. Hearing and body mass index were assessed and reviewed.   During the course of the visit the patient was educated and counseled about appropriate screening and preventive services including : fall prevention , diabetes screening, nutrition counseling, colorectal cancer screening, and recommended immunizations.    CC: Diagnoses of Morbid obesity ([place]), Hyperlipidemia LDL goal <100, Uncontrolled type 2 diabetes mellitus with hyperglycemia ([place]), Essential hypertension, Hepatic steatosis, and Encounter for preventive health examination were pertinent to this visit.  1) T2DM:  Christopher Stewart notes improved glycemic control with addition of amaryl 1 mg .  Eye exam is up to date.  No neuropathy  2) Obesity:  Recently retired from [employer] and is enjoying the reduction and stress and increased opportunities for healthy lifestyle. Christopher Stewart has already lost 7 lbs.   [name] has a past medical history of Arthritis, Cryptorchidism (12/1969), Diabetes mellitus, type 2 ([place]), Diverticulosis, GERD (gastroesophageal reflux disease), Gout, Hyperlipemia, Hypertension, Insomnia (04/27/2010), Nephrolithiasis, Obstructive sleep apnea (04/27/2010), OSA on CPAP, Reactive airway disease, Right ureteral stone (10/25/2017), Sleep apnea, and Tinea cruris.   Christopher Stewart has a past surgical history that includes Surgery scrotal / testicular (N/A, 1966); cystoscopy (09/2008); Cystoscopy/ureteroscopy/holmium laser/stent placement (Right, 08/19/2017); and Colonoscopy.   His family history includes Deep vein thrombosis in his father; Heart disease in his mother.Christopher Stewart reports that Christopher Stewart has never smoked. Christopher Stewart has never used smokeless  tobacco. Christopher Stewart reports that Christopher Stewart does not drink alcohol and does not use drugs.  Outpatient Medications Prior to Visit   Medication Sig Dispense Refill  . allopurinol (ZYLOPRIM) 300 MG tablet Take 1 tablet (300 mg total) by mouth daily. 90 tablet 3  . AMBULATORY NON FORMULARY MEDICATION Place 30 g rectally 2 (two) times daily. Medication Name: Diltiazem 2% / lidocaine 5%  Cream 50/50 mix  Insert into anal canal twice daily 30 g 3  . amLODipine (NORVASC) 10 MG tablet TAKE 1 TABLET BY MOUTH ONCE DAILY 90 tablet 1  . aspirin 81 MG tablet Take 81 mg by mouth every other day.     . celecoxib (CELEBREX) 200 MG capsule TAKE 1 CAPSULE BY MOUTH 2 TIMES DAILY. 180 capsule 1  . Eszopiclone 3 MG TABS Take 1 tablet (3 mg total) by mouth at bedtime as needed. for sleep 30 tablet 5  . glimepiride (AMARYL) 1 MG tablet TAKE 1 TABLET BY MOUTH ONCE A DAY WITH DINNER 90 tablet 1  . glucose blood (FREESTYLE LITE) test strip USE TO CHECK BLOOD SUGAR ONCE DAILY 100 strip 2  . glucose monitoring kit (FREESTYLE) monitoring kit 1 each by Does not apply route as needed for other. FREESTYLE LITE 1 each 1  . HYDROcodone-acetaminophen (NORCO) 10-325 MG tablet Take 1 tablet by mouth every 6 (six) hours as needed. 30 tablet 0  . JARDIANCE 25 MG TABS tablet TAKE 1 TABLET BY MOUTH DAILY. 90 tablet 1  . Lancets (FREESTYLE) lancets USE TO CHECK BLOOD SUGAR ONCE DAILY 100 each 2  . metFORMIN (GLUCOPHAGE) 500 MG tablet 2 tablets twice daily,  1 at lunch 150 tablet 3  . pantoprazole (PROTONIX) 40 MG tablet Take 1 tablet (40 mg total) by mouth daily. 90 tablet 3  . rosuvastatin (CRESTOR) 10 MG tablet TAKE 1 TABLET BY MOUTH EVERY OTHER DAY 45 tablet 1  . telmisartan (MICARDIS) 80 MG tablet TAKE 1 TABLET BY MOUTH ONCE A DAY 60 tablet 1  . terazosin (HYTRIN) 5 MG capsule Take 5 mg by mouth daily.    . TRULICITY 3 IZ/1.2OF SOPN INJECT 1 PEN INTO THE SKIN ONCE A WEEK 2 mL 3   Facility-Administered Medications Prior to Visit  Medication Dose Route Frequency Provider Last Rate Last Admin  . 0.9 %  sodium chloride infusion  500 mL Intravenous Once Gatha Mayer, MD        Review of Systems  Objective:  BP 140/78 (BP Location: Left Arm, Patient Position: Sitting, Cuff Size: Large)   Pulse 87   Temp 98.3 F (36.8 C) (Oral)   Ht 5' 10"  (1.778 m)   Wt (!) 302 lb (137 kg)   SpO2 96%   BMI 43.33 kg/m   Physical Exam    Assessment & Plan:   Problem List Items Addressed This Visit      Unprioritized   Uncontrolled type 2 diabetes mellitus ([place])    A1c improved but not at goal with addition of 3 mg Trulicity. Metformin and jardiance are also  being utilized and are  on maximal doses .  Christopher Stewart notes improved control with the addition of  66m amaryl at dinnertime.  Continue asa 3/week,  Metformin, Statin,  And ARB .  Eye exam UTD Nov 1   Lab Results  Component Value Date   HGBA1C 7.5 (H) 12/05/2019   Lab Results  Component Value Date   MICROALBUR <0.7 03/28/2019   MICROALBUR <0.7 08/25/2018  Morbid obesity ([place])    Christopher Stewart is starting to lose weight since retiring from his full time position at [employer] .  I have congratulated him and encouraged  Continued weight loss with goal of 10% of body weigh over the next 6 months using a low glycemic index diet and regular exercise a minimum of 5 days per week.        Hyperlipidemia LDL goal <100    Managed with every other day crestor 10 mg .  LDL is <70 per recent panel.  No changes today  Lab Results  Component Value Date   CHOL 107 12/05/2019   HDL 25.00 (L) 12/05/2019   LDLCALC 44 12/05/2019   LDLDIRECT 76.0 03/28/2019   TRIG 190.0 (H) 12/05/2019   CHOLHDL 4 12/05/2019   Lab Results  Component Value Date   ALT 56 (H) 12/05/2019   AST 36 12/05/2019   ALKPHOS 65 12/05/2019   BILITOT 0.5 12/05/2019         Hepatic steatosis    Suggested by CT scan in 2012 ,  With persistent ALT elevation. Reviewed principles of management, including alcohol abstinence,  Statin,  low glycemic index diet , and weight loss.   Lab Results  Component Value Date   ALT 56 (H) 12/05/2019    AST 36 12/05/2019   ALKPHOS 65 12/05/2019   BILITOT 0.5 12/05/2019         Essential hypertension    Well controlled by home readings on current regimen. Renal function stable, no changes today.  Lab Results  Component Value Date   CREATININE 1.03 12/05/2019   Lab Results  Component Value Date   NA 142 12/05/2019   K 4.0 12/05/2019   CL 108 12/05/2019   CO2 25 12/05/2019         Encounter for preventive health examination    age appropriate education and counseling updated, referrals for preventative services and immunizations addressed, dietary and smoking counseling addressed, most recent labs reviewed.  I have personally reviewed and have noted:  1) the patient's medical and social history 2) The pt's use of alcohol, tobacco, and illicit drugs 3) The patient's current medications and supplements 4) Functional ability including ADL's, fall risk, home safety risk, hearing and visual impairment 5) Diet and physical activities 6) Evidence for depression or mood disorder 7) The patient's height, weight, and BMI have been recorded in the chart  I have made referrals, and provided counseling and education based on review of the above         I am having Lenn Cal "Dr. Enrique Sack" start on Zoster Vaccine Adjuvanted. I am also having him maintain his aspirin, terazosin, HYDROcodone-acetaminophen, glucose monitoring kit, AMBULATORY NON FORMULARY MEDICATION, freestyle, celecoxib, amLODipine, allopurinol, metFORMIN, pantoprazole, FREESTYLE LITE, Eszopiclone, glimepiride, Trulicity, Jardiance, rosuvastatin, and telmisartan. We will continue to administer sodium chloride.  Meds ordered this encounter  Medications  . Zoster Vaccine Adjuvanted [facility]) injection    Sig: Inject 0.5 mLs into the muscle once for 1 dose.    Dispense:  1 each    Refill:  1    There are no discontinued medications.  Follow-up: No follow-ups on file.   Crecencio Mc, MD

## 2019-12-09 ENCOUNTER — Encounter: Payer: Self-pay | Admitting: Internal Medicine

## 2019-12-09 DIAGNOSIS — Z0000 Encounter for general adult medical examination without abnormal findings: Secondary | ICD-10-CM | POA: Insufficient documentation

## 2019-12-09 NOTE — Assessment & Plan Note (Signed)
A1c improved but not at goal with addition of 3 mg Trulicity. Metformin and jardiance are also  being utilized and are  on maximal doses .  He notes improved control with the addition of  1mg  amaryl at dinnertime.  Continue asa 3/week,  Metformin, Statin,  And ARB .  Eye exam UTD Nov 1   Lab Results  Component Value Date   HGBA1C 7.5 (H) 12/05/2019   Lab Results  Component Value Date   MICROALBUR <0.7 03/28/2019   MICROALBUR <0.7 08/25/2018

## 2019-12-09 NOTE — Assessment & Plan Note (Signed)
Well controlled by home readings on current regimen. Renal function stable, no changes today.  Lab Results  Component Value Date   CREATININE 1.03 12/05/2019   Lab Results  Component Value Date   NA 142 12/05/2019   K 4.0 12/05/2019   CL 108 12/05/2019   CO2 25 12/05/2019

## 2019-12-09 NOTE — Assessment & Plan Note (Signed)

## 2019-12-09 NOTE — Assessment & Plan Note (Signed)
Managed with every other day crestor 10 mg .  LDL is <70 per recent panel.  No changes today  Lab Results  Component Value Date   CHOL 107 12/05/2019   HDL 25.00 (L) 12/05/2019   LDLCALC 44 12/05/2019   LDLDIRECT 76.0 03/28/2019   TRIG 190.0 (H) 12/05/2019   CHOLHDL 4 12/05/2019   Lab Results  Component Value Date   ALT 56 (H) 12/05/2019   AST 36 12/05/2019   ALKPHOS 65 12/05/2019   BILITOT 0.5 12/05/2019

## 2019-12-09 NOTE — Assessment & Plan Note (Signed)
Suggested by CT scan in 2012 ,  With persistent ALT elevation. Reviewed principles of management, including alcohol abstinence,  Statin,  low glycemic index diet , and weight loss.   Lab Results  Component Value Date   ALT 56 (H) 12/05/2019   AST 36 12/05/2019   ALKPHOS 65 12/05/2019   BILITOT 0.5 12/05/2019

## 2019-12-09 NOTE — Assessment & Plan Note (Signed)
He is starting to lose weight since retiring from his full time position at [employer] .  I have congratulated him and encouraged  Continued weight loss with goal of 10% of body weigh over the next 6 months using a low glycemic index diet and regular exercise a minimum of 5 days per week.

## 2019-12-26 ENCOUNTER — Other Ambulatory Visit: Payer: Self-pay | Admitting: Internal Medicine

## 2019-12-26 MED FILL — AMLODIPINE BESYLATE 10 MG T: 10 | 90 days supply | Qty: 90 | Fill #0

## 2019-12-26 MED FILL — METFORMIN HCL 500 MG TABS: 500 | 30 days supply | Qty: 150 | Fill #0

## 2019-12-26 MED FILL — GLIMEPIRIDE 1 MG TABLET: 1 | 90 days supply | Qty: 90 | Fill #1

## 2019-12-26 MED FILL — CELECOXIB 200 MG CAP: 200 | 90 days supply | Qty: 180 | Fill #0

## 2020-01-02 MED FILL — ESZOPICLONE 3 MG TABS: 3 | 30 days supply | Qty: 30 | Fill #4

## 2020-01-02 MED FILL — TERAZOSIN 5 MG CAPSULE: 5 | 90 days supply | Qty: 90 | Fill #3

## 2020-01-11 MED FILL — TELMISARTAN 80 MG TABS: 80 | 60 days supply | Qty: 60 | Fill #1

## 2020-01-11 MED FILL — FREESTYLE LANCETS: 90 days supply | Qty: 100 | Fill #2

## 2020-01-11 MED FILL — FREESTYLE LITE TEST STRIP: 90 days supply | Qty: 100 | Fill #1

## 2020-01-11 MED FILL — TRULICITY 3 MG/0.5ML SOPN: 3 | 28 days supply | Qty: 2 | Fill #1

## 2020-01-23 MED FILL — METFORMIN HCL 500 MG TABS: 500 | 30 days supply | Qty: 150 | Fill #1

## 2020-02-01 MED FILL — ESZOPICLONE 3 MG TABS: 3 | 30 days supply | Qty: 30 | Fill #5

## 2020-02-01 MED FILL — ALLOPURINOL 300 MG TABS: 300 | 90 days supply | Qty: 90 | Fill #2

## 2020-02-01 MED FILL — PANTOPRAZOLE SOD DR 40 MG T: 40 | 90 days supply | Qty: 90 | Fill #2

## 2020-02-17 ENCOUNTER — Other Ambulatory Visit: Payer: Self-pay | Admitting: Internal Medicine

## 2020-02-17 MED FILL — TRULICITY 3 MG/0.5ML SOPN: 3 | 84 days supply | Qty: 6 | Fill #0

## 2020-02-17 MED FILL — METFORMIN HCL 500 MG TABS: 500 | 30 days supply | Qty: 150 | Fill #2

## 2020-03-19 ENCOUNTER — Other Ambulatory Visit: Payer: Self-pay | Admitting: Internal Medicine

## 2020-03-19 MED FILL — ESZOPICLONE 3 MG TABS: 3 | 30 days supply | Qty: 30 | Fill #0

## 2020-03-19 MED FILL — GLIMEPIRIDE 1 MG TABLET: 1 | 90 days supply | Qty: 90 | Fill #0

## 2020-03-19 MED FILL — METFORMIN HCL 500 MG TABS: 500 | 30 days supply | Qty: 150 | Fill #3

## 2020-03-19 MED FILL — TELMISARTAN 80 MG TABS: 80 | 60 days supply | Qty: 60 | Fill #0

## 2020-03-19 MED FILL — AMLODIPINE BESYLATE 10 MG T: 10 | 90 days supply | Qty: 90 | Fill #1

## 2020-03-19 NOTE — Telephone Encounter (Signed)
Eszopiclone refilled 

## 2020-03-26 ENCOUNTER — Other Ambulatory Visit (HOSPITAL_COMMUNITY): Payer: Self-pay | Admitting: Urology

## 2020-04-06 ENCOUNTER — Other Ambulatory Visit (HOSPITAL_BASED_OUTPATIENT_CLINIC_OR_DEPARTMENT_OTHER): Payer: Self-pay

## 2020-04-24 ENCOUNTER — Other Ambulatory Visit (HOSPITAL_COMMUNITY): Payer: Self-pay

## 2020-04-24 MED FILL — Allopurinol Tab 300 MG: ORAL | 90 days supply | Qty: 90 | Fill #0 | Status: AC

## 2020-04-24 MED FILL — Pantoprazole Sodium EC Tab 40 MG (Base Equiv): ORAL | 90 days supply | Qty: 90 | Fill #0 | Status: AC

## 2020-04-24 MED FILL — Eszopiclone Tab 3 MG: ORAL | 30 days supply | Qty: 30 | Fill #0 | Status: AC

## 2020-04-25 ENCOUNTER — Other Ambulatory Visit (HOSPITAL_COMMUNITY): Payer: Self-pay

## 2020-05-15 DIAGNOSIS — G4733 Obstructive sleep apnea (adult) (pediatric): Secondary | ICD-10-CM | POA: Diagnosis not present

## 2020-05-19 ENCOUNTER — Other Ambulatory Visit: Payer: Self-pay | Admitting: Internal Medicine

## 2020-05-19 MED FILL — Dulaglutide Soln Auto-injector 3 MG/0.5ML: SUBCUTANEOUS | 28 days supply | Qty: 2 | Fill #0 | Status: AC

## 2020-05-19 MED FILL — Telmisartan Tab 80 MG: ORAL | 60 days supply | Qty: 60 | Fill #0 | Status: AC

## 2020-05-21 ENCOUNTER — Other Ambulatory Visit (HOSPITAL_COMMUNITY): Payer: Self-pay

## 2020-05-21 MED ORDER — EMPAGLIFLOZIN 25 MG PO TABS
ORAL_TABLET | Freq: Every day | ORAL | 0 refills | Status: DC
  Filled 2020-05-21: qty 90, 90d supply, fill #0

## 2020-06-06 ENCOUNTER — Other Ambulatory Visit (HOSPITAL_COMMUNITY): Payer: Self-pay

## 2020-06-15 ENCOUNTER — Ambulatory Visit: Payer: 59 | Admitting: Internal Medicine

## 2020-06-15 ENCOUNTER — Encounter: Payer: Self-pay | Admitting: Internal Medicine

## 2020-06-15 ENCOUNTER — Other Ambulatory Visit (HOSPITAL_COMMUNITY): Payer: Self-pay

## 2020-06-15 ENCOUNTER — Other Ambulatory Visit: Payer: Self-pay

## 2020-06-15 VITALS — BP 124/74 | HR 88 | Temp 95.9°F | Resp 16 | Ht 70.0 in | Wt 300.0 lb

## 2020-06-15 DIAGNOSIS — E559 Vitamin D deficiency, unspecified: Secondary | ICD-10-CM | POA: Diagnosis not present

## 2020-06-15 DIAGNOSIS — I10 Essential (primary) hypertension: Secondary | ICD-10-CM | POA: Diagnosis not present

## 2020-06-15 DIAGNOSIS — E538 Deficiency of other specified B group vitamins: Secondary | ICD-10-CM

## 2020-06-15 DIAGNOSIS — E669 Obesity, unspecified: Secondary | ICD-10-CM | POA: Diagnosis not present

## 2020-06-15 DIAGNOSIS — E1159 Type 2 diabetes mellitus with other circulatory complications: Secondary | ICD-10-CM

## 2020-06-15 DIAGNOSIS — E1169 Type 2 diabetes mellitus with other specified complication: Secondary | ICD-10-CM

## 2020-06-15 DIAGNOSIS — K130 Diseases of lips: Secondary | ICD-10-CM

## 2020-06-15 DIAGNOSIS — K760 Fatty (change of) liver, not elsewhere classified: Secondary | ICD-10-CM

## 2020-06-15 DIAGNOSIS — I152 Hypertension secondary to endocrine disorders: Secondary | ICD-10-CM | POA: Diagnosis not present

## 2020-06-15 MED ORDER — AMLODIPINE BESYLATE 10 MG PO TABS
ORAL_TABLET | Freq: Every day | ORAL | 1 refills | Status: DC
Start: 2020-06-15 — End: 2020-09-27
  Filled 2020-06-15: qty 90, 90d supply, fill #0
  Filled 2020-09-17: qty 90, 90d supply, fill #1

## 2020-06-15 MED ORDER — PANTOPRAZOLE SODIUM 40 MG PO TBEC
DELAYED_RELEASE_TABLET | Freq: Every day | ORAL | 3 refills | Status: DC
  Filled 2020-06-15: qty 90, fill #0
  Filled 2020-07-30: qty 90, 90d supply, fill #0
  Filled 2020-10-24: qty 90, 90d supply, fill #1
  Filled 2021-01-23: qty 90, 90d supply, fill #2
  Filled 2021-04-22: qty 90, 90d supply, fill #3

## 2020-06-15 MED ORDER — METFORMIN HCL 500 MG PO TABS
ORAL_TABLET | ORAL | 3 refills | Status: DC
Start: 2020-06-15 — End: 2020-11-05
  Filled 2020-06-15: qty 150, 30d supply, fill #0
  Filled 2020-07-16: qty 150, 30d supply, fill #1
  Filled 2020-09-05: qty 150, 30d supply, fill #2
  Filled 2020-10-01: qty 150, 30d supply, fill #3

## 2020-06-15 MED ORDER — ZOSTER VAC RECOMB ADJUVANTED 50 MCG/0.5ML IM SUSR: 0.5000 mL | Freq: Once | INTRAMUSCULAR | 1 refills | Status: AC

## 2020-06-15 MED ORDER — DULAGLUTIDE 3 MG/0.5ML ~~LOC~~ SOAJ
SUBCUTANEOUS | 3 refills | Status: DC
  Filled 2020-06-15: qty 6, 84d supply, fill #0

## 2020-06-15 MED ORDER — EMPAGLIFLOZIN 25 MG PO TABS
ORAL_TABLET | Freq: Every day | ORAL | 0 refills | Status: DC
  Filled 2020-06-15: qty 90, fill #0
  Filled 2020-09-05: qty 90, 90d supply, fill #0

## 2020-06-15 MED ORDER — TELMISARTAN 80 MG PO TABS
ORAL_TABLET | Freq: Every day | ORAL | 1 refills | Status: DC
  Filled 2020-06-15: qty 90, fill #0
  Filled 2020-07-16: qty 90, 90d supply, fill #0
  Filled 2020-10-15: qty 90, 90d supply, fill #1

## 2020-06-15 MED ORDER — ALLOPURINOL 300 MG PO TABS
ORAL_TABLET | Freq: Every day | ORAL | 3 refills | Status: DC
Start: 2020-06-15 — End: 2021-07-02
  Filled 2020-06-15: qty 90, fill #0
  Filled 2020-07-30: qty 90, 90d supply, fill #0
  Filled 2020-10-24: qty 90, 90d supply, fill #1
  Filled 2021-01-23: qty 90, 90d supply, fill #2
  Filled 2021-04-22: qty 90, 90d supply, fill #3

## 2020-06-15 MED ORDER — ROSUVASTATIN CALCIUM 10 MG PO TABS
ORAL_TABLET | ORAL | 1 refills | Status: DC
  Filled 2020-06-15: qty 45, 90d supply, fill #0
  Filled 2020-10-01: qty 45, 90d supply, fill #1

## 2020-06-15 NOTE — Patient Instructions (Addendum)
I will get Christopher Stewart involved to  Get the [place] for you.

## 2020-06-15 NOTE — Progress Notes (Signed)
Subjective:  Patient ID: Christopher Stewart, male    DOB: [birth year]  Age: 65 y.o. MRN: [id number]  CC: The primary encounter diagnosis was Obesity, diabetes, and hypertension syndrome ([place]). Diagnoses of B12 deficiency, Vitamin D deficiency, Cheilitis, Essential hypertension, Hepatic steatosis, and Morbid obesity ([place]) were also pertinent to this visit.  HPI Christopher Stewart presents for FOLLOW UP ON TYPE 2 DM WITH obesity , OSA, Fatty liver and hypertension   This visit occurred during the SARS-CoV-2 public health emergency.  Safety protocols were in place, including screening questions prior to the visit, additional usage of staff PPE, and extensive cleaning of exam room while observing appropriate contact time as indicated for disinfecting solutions.    T2DM:  he  feels generally well,  But is not  exercising regularly or trying to lose weight. Checking  blood sugars  once daily at variable times, more often if he feels he may be having a hypoglycemic event. .  BS have been under 120 fasting most days,  Higher if he had pasta the night before.  Taking metformin amaryl, jardiance and trulicity .   Denies any recent hypoglyemic events.  Taking   medications as directed. Following a carbohydrate modified diet 6 days per week. Denies numbness, burning and tingling of extremities. Appetite is good.    OSA:  diagnosed by sleep study. he is wearing  CPAP every night a minimum of 6 hours per night and notes improved daytime wakefulness and decreased fatigue   Hypertension: patient checks blood pressure twice weekly at home.  Readings have been for the most part < 140/80 at rest . Patient is following a reduced salt diet most days and is taking medications as prescribed    Outpatient Medications Prior to Visit  Medication Sig Dispense Refill  . AMBULATORY NON FORMULARY MEDICATION Place 30 g rectally 2 (two) times daily. Medication Name: Diltiazem 2% / lidocaine 5%  Cream 50/50 mix  Insert into  anal canal twice daily 30 g 3  . aspirin 81 MG tablet Take 81 mg by mouth every other day.    . celecoxib (CELEBREX) 200 MG capsule TAKE 1 CAPSULE BY MOUTH 2 TIMES DAILY. 180 capsule 1  . Eszopiclone 3 MG TABS TAKE 1 TABLET BY MOUTH AT BEDTIME AS NEEDED FOR SLEEP 30 tablet 5  . glimepiride (AMARYL) 1 MG tablet TAKE 1 TABLET BY MOUTH ONCE A DAY WITH DINNER 90 tablet 1  . glucose blood test strip USE TO CHECK BLOOD SUGAR ONCE DAILY 100 strip 2  . glucose monitoring kit (FREESTYLE) monitoring kit 1 each by Does not apply route as needed for other. FREESTYLE LITE 1 each 1  . terazosin (HYTRIN) 5 MG capsule TAKE 1 CAPSULE BY MOUTH AT BEDTIME 90 capsule 3  . allopurinol (ZYLOPRIM) 300 MG tablet TAKE 1 TABLET BY MOUTH DAILY. 90 tablet 3  . amLODipine (NORVASC) 10 MG tablet TAKE 1 TABLET BY MOUTH ONCE DAILY 90 tablet 1  . Dulaglutide 3 MG/0.5ML SOPN INJECT 1 PEN INTO THE SKIN ONCE A WEEK 2 mL 3  . empagliflozin (JARDIANCE) 25 MG TABS tablet TAKE 1 TABLET BY MOUTH DAILY. 90 tablet 0  . HYDROcodone-acetaminophen (NORCO) 10-325 MG tablet Take 1 tablet by mouth every 6 (six) hours as needed. 30 tablet 0  . metFORMIN (GLUCOPHAGE) 500 MG tablet TAKE 2 TABLETS BY MOUTH TWICE DAILY, AND 1 TABLET AT LUNCH 150 tablet 3  . pantoprazole (PROTONIX) 40 MG tablet TAKE 1 TABLET BY MOUTH DAILY. [place]  tablet 3  . rosuvastatin (CRESTOR) 10 MG tablet TAKE 1 TABLET BY MOUTH EVERY OTHER DAY 45 tablet 1  . telmisartan (MICARDIS) 80 MG tablet TAKE 1 TABLET BY MOUTH ONCE A DAY 60 tablet 1  . terazosin (HYTRIN) 5 MG capsule Take 5 mg by mouth daily.    Marland Kitchen terazosin (HYTRIN) 5 MG capsule TAKE 1 CAPSULE BY MOUTH AT BEDTIME 90 capsule 3   Facility-Administered Medications Prior to Visit  Medication Dose Route Frequency Provider Last Rate Last Admin  . 0.9 %  sodium chloride infusion  500 mL Intravenous Once Gatha Mayer, MD        Review of Systems;  Patient denies headache, fevers, malaise, unintentional weight loss, skin  rash, eye pain, sinus congestion and sinus pain, sore throat, dysphagia,  hemoptysis , cough, dyspnea, wheezing, chest pain, palpitations, orthopnea, edema, abdominal pain, nausea, melena, diarrhea, constipation, flank pain, dysuria, hematuria, urinary  Frequency, nocturia, numbness, tingling, seizures,  Focal weakness, Loss of consciousness,  Tremor, insomnia, depression, anxiety, and suicidal ideation.      Objective:  BP 124/74 (BP Location: Left Arm, Patient Position: Sitting, Cuff Size: Large)   Pulse 88   Temp (!) 95.9 F (35.5 C) (Temporal)   Resp 16   Ht _0  (1.778 m)   Wt 300 lb (136.1 kg)   SpO2 97%   BMI 43.05 kg/m   BP Readings from Last 3 Encounters:  06/15/20 124/74  12/07/19 140/78  08/25/19 130/70    Wt Readings from Last 3 Encounters:  06/15/20 300 lb (136.1 kg)  12/07/19 (!) 302 lb (137 kg)  08/25/19 (!) 309 lb 6.4 oz (140.3 kg)    General appearance: alert, cooperative and appears stated age Ears: normal TM's and external ear canals both ears Throat: lips, mucosa, and tongue normal; teeth and gums normal Neck: no adenopathy, no carotid bruit, supple, symmetrical, trachea midline and thyroid not enlarged, symmetric, no tenderness/mass/nodules Back: symmetric, no curvature. ROM normal. No CVA tenderness. Lungs: clear to auscultation bilaterally Heart: regular rate and rhythm, S1, S2 normal, no murmur, click, rub or gallop Abdomen: soft, non-tender; bowel sounds normal; no masses,  no organomegaly Pulses: 2+ and symmetric Skin: Skin color, texture, turgor normal. No rashes or lesions Lymph nodes: Cervical, supraclavicular, and axillary nodes normal.  Lab Results  Component Value Date   HGBA1C 7.4 (H) 06/15/2020   HGBA1C 7.5 (H) 12/05/2019   HGBA1C 7.7 (H) 08/15/2019    Lab Results  Component Value Date   CREATININE 1.17 06/15/2020   CREATININE 1.03 12/05/2019   CREATININE 0.99 08/15/2019    Lab Results  Component Value Date   WBC 6.4  12/05/2019   HGB 13.6 12/05/2019   HCT 41.0 12/05/2019   PLT 270.0 12/05/2019   GLUCOSE 176 (H) 06/15/2020   CHOL 113 06/15/2020   TRIG 211 (H) 06/15/2020   HDL 26 (L) 06/15/2020   LDLDIRECT 76.0 03/28/2019   LDLCALC 59 06/15/2020   ALT 63 (H) 06/15/2020   AST 38 (H) 06/15/2020   NA 142 06/15/2020   K 4.5 06/15/2020   CL 108 06/15/2020   CREATININE 1.17 06/15/2020   BUN 19 06/15/2020   CO2 24 06/15/2020   TSH 1.62 10/23/2017   PSA 0.33 12/05/2019   HGBA1C 7.4 (H) 06/15/2020   MICROALBUR 0.2 06/15/2020    DG C-Arm 1-60 Min-No Report  Result Date: 08/19/2017 Fluoroscopy was utilized by the requesting physician.  No radiographic interpretation.    Assessment & Plan:   Problem  List Items Addressed This Visit      Unprioritized   Essential hypertension    Well controlled by home readings on current regimen of telmisartan and amlodipine . Renal function stable, no changes today.  Lab Results  Component Value Date   CREATININE 1.17 06/15/2020   Lab Results  Component Value Date   NA 142 06/15/2020   K 4.5 06/15/2020   CL 108 06/15/2020   CO2 24 06/15/2020         Relevant Medications   amLODipine (NORVASC) 10 MG tablet   rosuvastatin (CRESTOR) 10 MG tablet   telmisartan (MICARDIS) 80 MG tablet   Hepatic steatosis    AST/ALT mildly elevated today.  He is taking metformin, asa and statin.  Weight loss needed.  Discussed trial of ozempic instead of trulicity.       Morbid obesity ([place])    Complicated by type 2 DM,  OSA and hepatic steatosis.  I have addressed  BMI and recommended  A trial of ozempic to increase satiety   I have also recommended that patient start exercising with a goal of 30 minutes of aerobic exercise a minimum of 5 days per week.        Relevant Medications   Dulaglutide 3 MG/0.5ML SOPN   empagliflozin (JARDIANCE) 25 MG TABS tablet   metFORMIN (GLUCOPHAGE) 500 MG tablet   Obesity, diabetes, and hypertension syndrome (HCC) - Primary    A1c  improved but not yet < 7.0 with addition of 3 mg Trulicity. Metformin and jardiance are also  being utilized and are  on maximal doses .  He notes improved control with the addition of  3m amaryl at dinnertime.  Continue asa 3/week,  Metformin, Statin,  And ARB .  Eye exam UTD Nov 1  And change GLP 1 agonist to ozempic   Lab Results  Component Value Date   HGBA1C 7.4 (H) 06/15/2020   Lab Results  Component Value Date   MICROALBUR 0.2 06/15/2020   MICROALBUR <0.7 03/28/2019           Relevant Medications   amLODipine (NORVASC) 10 MG tablet   Dulaglutide 3 MG/0.5ML SOPN   empagliflozin (JARDIANCE) 25 MG TABS tablet   metFORMIN (GLUCOPHAGE) 500 MG tablet   rosuvastatin (CRESTOR) 10 MG tablet   telmisartan (MICARDIS) 80 MG tablet   Other Relevant Orders   Hemoglobin A1c (Completed)   Comprehensive metabolic panel (Completed)   Lipid panel (Completed)   Microalbumin / creatinine urine ratio (Completed)    Other Visit Diagnoses    B12 deficiency       Relevant Orders   Vitamin B12 (Completed)   RBC Folate   Vitamin D deficiency       Relevant Orders   VITAMIN D 25 Hydroxy (Vit-D Deficiency, Fractures) (Completed)   Cheilitis       Relevant Orders   Vitamin B1      I am having RLenn Cal"Dr. KEnrique Sack start on Zoster Vaccine Adjuvanted. I am also having him maintain his aspirin, glucose monitoring kit, AMBULATORY NON FORMULARY MEDICATION, terazosin, glimepiride, Eszopiclone, celecoxib, glucose blood, allopurinol, amLODipine, Dulaglutide, empagliflozin, metFORMIN, pantoprazole, rosuvastatin, and telmisartan. We will continue to administer sodium chloride.  Meds ordered this encounter  Medications  . allopurinol (ZYLOPRIM) 300 MG tablet    Sig: TAKE 1 TABLET BY MOUTH DAILY.    Dispense:  90 tablet    Refill:  3  . amLODipine (NORVASC) 10 MG tablet    Sig: TAKE 1  TABLET BY MOUTH ONCE DAILY    Dispense:  90 tablet    Refill:  1  . Dulaglutide 3 MG/0.5ML SOPN     Sig: INJECT 1 PEN INTO THE SKIN ONCE A WEEK    Dispense:  2 mL    Refill:  3  . empagliflozin (JARDIANCE) 25 MG TABS tablet    Sig: TAKE 1 TABLET BY MOUTH DAILY.    Dispense:  90 tablet    Refill:  0  . metFORMIN (GLUCOPHAGE) 500 MG tablet    Sig: TAKE 2 TABLETS BY MOUTH TWICE DAILY, AND 1 TABLET AT LUNCH    Dispense:  150 tablet    Refill:  3  . pantoprazole (PROTONIX) 40 MG tablet    Sig: TAKE 1 TABLET BY MOUTH DAILY.    Dispense:  90 tablet    Refill:  3  . rosuvastatin (CRESTOR) 10 MG tablet    Sig: TAKE 1 TABLET BY MOUTH EVERY OTHER DAY    Dispense:  45 tablet    Refill:  1  . telmisartan (MICARDIS) 80 MG tablet    Sig: TAKE 1 TABLET BY MOUTH ONCE A DAY    Dispense:  90 tablet    Refill:  1  . Zoster Vaccine Adjuvanted [facility]) injection    Sig: Inject 0.5 mLs into the muscle once for 1 dose.    Dispense:  1 each    Refill:  1    Medications Discontinued During This Encounter  Medication Reason  . terazosin (HYTRIN) 5 MG capsule Duplicate  . terazosin (HYTRIN) 5 MG capsule Duplicate  . telmisartan (MICARDIS) 80 MG tablet Reorder  . Dulaglutide 3 MG/0.5ML SOPN Reorder  . amLODipine (NORVASC) 10 MG tablet Reorder  . metFORMIN (GLUCOPHAGE) 500 MG tablet Reorder  . rosuvastatin (CRESTOR) 10 MG tablet Reorder  . pantoprazole (PROTONIX) 40 MG tablet Reorder  . allopurinol (ZYLOPRIM) 300 MG tablet Reorder  . empagliflozin (JARDIANCE) 25 MG TABS tablet Reorder    Follow-up: No follow-ups on file.   Crecencio Mc, MD

## 2020-06-16 ENCOUNTER — Other Ambulatory Visit (HOSPITAL_COMMUNITY): Payer: Self-pay

## 2020-06-16 LAB — MICROALBUMIN / CREATININE URINE RATIO
Creatinine, Urine: 65 mg/dL (ref 20–320)
Microalb Creat Ratio: 3 mcg/mg creat (ref <30)
Microalb, Ur: 0.2 mg/dL

## 2020-06-16 LAB — COMPREHENSIVE METABOLIC PANEL
AG Ratio: 1.4 (calc) (ref 1.0–2.5)
ALT: 63 U/L — ABNORMAL HIGH (ref 9–46)
AST: 38 U/L — ABNORMAL HIGH (ref 10–35)
Albumin: 4.2 g/dL (ref 3.6–5.1)
Alkaline phosphatase (APISO): 89 U/L (ref 35–144)
BUN: 19 mg/dL (ref 7–25)
CO2: 24 mmol/L (ref 20–32)
Calcium: 10.2 mg/dL (ref 8.6–10.3)
Chloride: 108 mmol/L (ref 98–110)
Creat: 1.17 mg/dL (ref 0.70–1.25)
Globulin: 2.9 g/dL (calc) (ref 1.9–3.7)
Glucose, Bld: 176 mg/dL — ABNORMAL HIGH (ref 65–99)
Potassium: 4.5 mmol/L (ref 3.5–5.3)
Sodium: 142 mmol/L (ref 135–146)
Total Bilirubin: 0.3 mg/dL (ref 0.2–1.2)
Total Protein: 7.1 g/dL (ref 6.1–8.1)

## 2020-06-16 LAB — LIPID PANEL
Cholesterol: 113 mg/dL (ref <200)
HDL: 26 mg/dL — ABNORMAL LOW (ref >=40)
LDL Cholesterol (Calc): 59 mg/dL (calc)
Non-HDL Cholesterol (Calc): 87 mg/dL (calc) (ref <130)
Total CHOL/HDL Ratio: 4.3 (calc) (ref <5.0)
Triglycerides: 211 mg/dL — ABNORMAL HIGH (ref <150)

## 2020-06-16 LAB — HEMOGLOBIN A1C
Hgb A1c MFr Bld: 7.4 % of total Hgb — ABNORMAL HIGH (ref <5.7)
Mean Plasma Glucose: 166 mg/dL
eAG (mmol/L): 9.2 mmol/L

## 2020-06-16 MED FILL — Eszopiclone Tab 3 MG: ORAL | 30 days supply | Qty: 30 | Fill #1 | Status: AC

## 2020-06-16 MED FILL — Glimepiride Tab 1 MG: ORAL | 90 days supply | Qty: 90 | Fill #0 | Status: AC

## 2020-06-16 MED FILL — Celecoxib Cap 200 MG: ORAL | 90 days supply | Qty: 180 | Fill #0 | Status: AC

## 2020-06-17 ENCOUNTER — Other Ambulatory Visit: Payer: Self-pay | Admitting: Internal Medicine

## 2020-06-17 MED ORDER — OZEMPIC (0.25 OR 0.5 MG/DOSE) 2 MG/1.5ML ~~LOC~~ SOPN
0.2500 mg | PEN_INJECTOR | SUBCUTANEOUS | 5 refills | Status: DC
  Filled 2020-06-17: qty 1.5, 42d supply, fill #0

## 2020-06-17 NOTE — Assessment & Plan Note (Signed)
Well controlled by home readings on current regimen of telmisartan and amlodipine . Renal function stable, no changes today.  Lab Results  Component Value Date   CREATININE 1.17 06/15/2020   Lab Results  Component Value Date   NA 142 06/15/2020   K 4.5 06/15/2020   CL 108 06/15/2020   CO2 24 06/15/2020

## 2020-06-17 NOTE — Assessment & Plan Note (Signed)
AST/ALT mildly elevated today.  He is taking metformin, asa and statin.  Weight loss needed.  Discussed trial of ozempic instead of trulicity.

## 2020-06-17 NOTE — Assessment & Plan Note (Signed)
Complicated by type 2 DM,  OSA and hepatic steatosis.  I have addressed  BMI and recommended  A trial of ozempic to increase satiety   I have also recommended that patient start exercising with a goal of 30 minutes of aerobic exercise a minimum of 5 days per week.

## 2020-06-17 NOTE — Assessment & Plan Note (Addendum)
A1c improved but not yet < 7.0 with addition of 3 mg Trulicity. Metformin and jardiance are also  being utilized and are  on maximal doses .  He notes improved control with the addition of  1mg  amaryl at dinnertime.  Continue asa 3/week,  Metformin, Statin,  And ARB .  Eye exam UTD Nov 1  And change GLP 1 agonist to ozempic   Lab Results  Component Value Date   HGBA1C 7.4 (H) 06/15/2020   Lab Results  Component Value Date   MICROALBUR 0.2 06/15/2020   MICROALBUR <0.7 03/28/2019

## 2020-06-18 ENCOUNTER — Other Ambulatory Visit (HOSPITAL_COMMUNITY): Payer: Self-pay

## 2020-06-19 LAB — VITAMIN D 25 HYDROXY (VIT D DEFICIENCY, FRACTURES): Vit D, 25-Hydroxy: 12 ng/mL — ABNORMAL LOW (ref 30–100)

## 2020-06-19 LAB — VITAMIN B1: Vitamin B1 (Thiamine): 15 nmol/L (ref 8–30)

## 2020-06-19 LAB — VITAMIN B12: Vitamin B-12: 576 pg/mL (ref 200–1100)

## 2020-06-19 LAB — FOLATE RBC: RBC Folate: 620 ng/mL RBC (ref >280)

## 2020-06-21 ENCOUNTER — Other Ambulatory Visit (HOSPITAL_COMMUNITY): Payer: Self-pay

## 2020-06-21 MED ORDER — ERGOCALCIFEROL 1.25 MG (50000 UT) PO CAPS
50000.0000 [IU] | ORAL_CAPSULE | ORAL | 0 refills | Status: DC
  Filled 2020-06-21: qty 12, 84d supply, fill #0

## 2020-06-25 ENCOUNTER — Telehealth: Payer: 59

## 2020-07-16 ENCOUNTER — Other Ambulatory Visit (HOSPITAL_COMMUNITY): Payer: Self-pay

## 2020-07-16 MED FILL — Eszopiclone Tab 3 MG: ORAL | 30 days supply | Qty: 30 | Fill #2 | Status: AC

## 2020-07-16 MED FILL — Terazosin HCl Cap 5 MG (Base Equivalent): ORAL | 90 days supply | Qty: 90 | Fill #0 | Status: AC

## 2020-07-17 ENCOUNTER — Other Ambulatory Visit (HOSPITAL_COMMUNITY): Payer: Self-pay

## 2020-07-18 MED ORDER — OZEMPIC (0.25 OR 0.5 MG/DOSE) 2 MG/1.5ML ~~LOC~~ SOPN
0.5000 mg | PEN_INJECTOR | SUBCUTANEOUS | 2 refills | Status: DC
  Filled 2020-07-18: qty 4.5, 84d supply, fill #0

## 2020-07-18 NOTE — Addendum Note (Signed)
Addended by: Crecencio Mc on: 07/18/2020 10:36 PM   Modules accepted: Orders

## 2020-07-19 ENCOUNTER — Other Ambulatory Visit (HOSPITAL_COMMUNITY): Payer: Self-pay

## 2020-07-30 ENCOUNTER — Other Ambulatory Visit (HOSPITAL_COMMUNITY): Payer: Self-pay

## 2020-08-06 ENCOUNTER — Other Ambulatory Visit (HOSPITAL_COMMUNITY): Payer: Self-pay

## 2020-08-06 ENCOUNTER — Other Ambulatory Visit: Payer: Self-pay | Admitting: Internal Medicine

## 2020-08-06 MED ORDER — FREESTYLE LANCETS MISC
Freq: Every day | 2 refills | Status: AC
  Filled 2020-08-06: qty 100, 90d supply, fill #0
  Filled 2020-12-03: qty 100, 90d supply, fill #1
  Filled 2021-03-04: qty 100, 90d supply, fill #2

## 2020-08-06 MED FILL — Glucose Blood Test Strip: 90 days supply | Qty: 100 | Fill #0 | Status: AC

## 2020-08-07 ENCOUNTER — Other Ambulatory Visit (HOSPITAL_COMMUNITY): Payer: Self-pay

## 2020-08-21 ENCOUNTER — Other Ambulatory Visit (HOSPITAL_COMMUNITY): Payer: Self-pay

## 2020-08-21 ENCOUNTER — Telehealth: Payer: Self-pay | Admitting: Internal Medicine

## 2020-08-21 ENCOUNTER — Other Ambulatory Visit: Payer: Self-pay | Admitting: Internal Medicine

## 2020-08-21 DIAGNOSIS — I152 Hypertension secondary to endocrine disorders: Secondary | ICD-10-CM

## 2020-08-21 DIAGNOSIS — E559 Vitamin D deficiency, unspecified: Secondary | ICD-10-CM

## 2020-08-21 DIAGNOSIS — E1169 Type 2 diabetes mellitus with other specified complication: Secondary | ICD-10-CM

## 2020-08-21 MED ORDER — OZEMPIC (1 MG/DOSE) 4 MG/3ML ~~LOC~~ SOPN
1.0000 mg | PEN_INJECTOR | SUBCUTANEOUS | 2 refills | Status: DC
Start: 2020-08-21 — End: 2020-09-27
  Filled 2020-08-21: qty 3, 28d supply, fill #0
  Filled 2020-09-17: qty 3, 28d supply, fill #1

## 2020-08-21 NOTE — Telephone Encounter (Signed)
I have ordered a lipid panel, cmp, and A1c. Is there anything else that needs to be ordered before his appt on 09/27/2020?

## 2020-08-21 NOTE — Telephone Encounter (Signed)
Pt wanted to have labs done before his appt in Sept no labs in. Pt wanted to discuss increasing the Ozempic

## 2020-08-22 NOTE — Telephone Encounter (Signed)
Are you okay with me adding the vitamin d to his lab orders to be done before his appt?

## 2020-08-23 NOTE — Addendum Note (Signed)
Addended by: Adair Laundry on: 08/23/2020 10:06 AM   Modules accepted: Orders

## 2020-08-23 NOTE — Telephone Encounter (Signed)
Spoke with pt and scheduled fasting lab appt.

## 2020-08-23 NOTE — Telephone Encounter (Signed)
LMTCB. Need to schedule pt for a fasting lab appt after 09/15/2020 but before 09/27/2020.

## 2020-08-29 NOTE — Telephone Encounter (Signed)
For your information  

## 2020-08-30 ENCOUNTER — Ambulatory Visit: Payer: 59 | Admitting: Internal Medicine

## 2020-09-05 MED FILL — Eszopiclone Tab 3 MG: ORAL | 30 days supply | Qty: 30 | Fill #3 | Status: AC

## 2020-09-06 ENCOUNTER — Other Ambulatory Visit (HOSPITAL_COMMUNITY): Payer: Self-pay

## 2020-09-17 ENCOUNTER — Other Ambulatory Visit: Payer: Self-pay | Admitting: Internal Medicine

## 2020-09-18 ENCOUNTER — Other Ambulatory Visit (HOSPITAL_COMMUNITY): Payer: Self-pay

## 2020-09-18 MED ORDER — GLIMEPIRIDE 1 MG PO TABS
ORAL_TABLET | ORAL | 1 refills | Status: DC
Start: 2020-09-18 — End: 2020-10-01
  Filled 2020-09-18: qty 90, 90d supply, fill #0

## 2020-09-21 ENCOUNTER — Other Ambulatory Visit (INDEPENDENT_AMBULATORY_CARE_PROVIDER_SITE_OTHER): Payer: 59

## 2020-09-21 ENCOUNTER — Other Ambulatory Visit: Payer: Self-pay

## 2020-09-21 DIAGNOSIS — E669 Obesity, unspecified: Secondary | ICD-10-CM

## 2020-09-21 DIAGNOSIS — E1169 Type 2 diabetes mellitus with other specified complication: Secondary | ICD-10-CM | POA: Diagnosis not present

## 2020-09-21 DIAGNOSIS — I152 Hypertension secondary to endocrine disorders: Secondary | ICD-10-CM | POA: Diagnosis not present

## 2020-09-21 DIAGNOSIS — E559 Vitamin D deficiency, unspecified: Secondary | ICD-10-CM

## 2020-09-21 DIAGNOSIS — E1159 Type 2 diabetes mellitus with other circulatory complications: Secondary | ICD-10-CM

## 2020-09-21 LAB — LIPID PANEL
Cholesterol: 115 mg/dL (ref 0–200)
HDL: 25.8 mg/dL — ABNORMAL LOW (ref >39.00)
LDL Cholesterol: 63 mg/dL (ref 0–99)
NonHDL: 89.06
Total CHOL/HDL Ratio: 4
Triglycerides: 128 mg/dL (ref 0.0–149.0)
VLDL: 25.6 mg/dL (ref 0.0–40.0)

## 2020-09-21 LAB — COMPREHENSIVE METABOLIC PANEL
ALT: 54 U/L — ABNORMAL HIGH (ref 0–53)
AST: 30 U/L (ref 0–37)
Albumin: 3.9 g/dL (ref 3.5–5.2)
Alkaline Phosphatase: 64 U/L (ref 39–117)
BUN: 16 mg/dL (ref 6–23)
CO2: 26 mEq/L (ref 19–32)
Calcium: 9.7 mg/dL (ref 8.4–10.5)
Chloride: 104 mEq/L (ref 96–112)
Creatinine, Ser: 1.18 mg/dL (ref 0.40–1.50)
GFR: 65.21 mL/min (ref >60.00)
Glucose, Bld: 142 mg/dL — ABNORMAL HIGH (ref 70–99)
Potassium: 4.3 mEq/L (ref 3.5–5.1)
Sodium: 140 mEq/L (ref 135–145)
Total Bilirubin: 0.6 mg/dL (ref 0.2–1.2)
Total Protein: 6.6 g/dL (ref 6.0–8.3)

## 2020-09-21 LAB — VITAMIN D 25 HYDROXY (VIT D DEFICIENCY, FRACTURES): VITD: 24.33 ng/mL — ABNORMAL LOW (ref 30.00–100.00)

## 2020-09-21 LAB — HEMOGLOBIN A1C: Hgb A1c MFr Bld: 7.8 % — ABNORMAL HIGH (ref 4.6–6.5)

## 2020-09-27 ENCOUNTER — Other Ambulatory Visit (HOSPITAL_COMMUNITY): Payer: Self-pay

## 2020-09-27 ENCOUNTER — Telehealth (INDEPENDENT_AMBULATORY_CARE_PROVIDER_SITE_OTHER): Payer: 59 | Admitting: Internal Medicine

## 2020-09-27 ENCOUNTER — Telehealth: Payer: 59 | Admitting: Internal Medicine

## 2020-09-27 ENCOUNTER — Encounter: Payer: Self-pay | Admitting: Internal Medicine

## 2020-09-27 DIAGNOSIS — E1169 Type 2 diabetes mellitus with other specified complication: Secondary | ICD-10-CM | POA: Diagnosis not present

## 2020-09-27 DIAGNOSIS — G4733 Obstructive sleep apnea (adult) (pediatric): Secondary | ICD-10-CM

## 2020-09-27 DIAGNOSIS — E669 Obesity, unspecified: Secondary | ICD-10-CM | POA: Diagnosis not present

## 2020-09-27 DIAGNOSIS — I152 Hypertension secondary to endocrine disorders: Secondary | ICD-10-CM

## 2020-09-27 DIAGNOSIS — E1159 Type 2 diabetes mellitus with other circulatory complications: Secondary | ICD-10-CM | POA: Diagnosis not present

## 2020-09-27 MED ORDER — EMPAGLIFLOZIN 25 MG PO TABS
ORAL_TABLET | Freq: Every day | ORAL | 0 refills | Status: DC
  Filled 2020-09-27 – 2020-12-03 (×2): qty 90, 90d supply, fill #0

## 2020-09-27 MED ORDER — GLUCOSE BLOOD VI STRP
ORAL_STRIP | Freq: Every day | 2 refills | Status: AC
  Filled 2020-09-27: qty 100, fill #0
  Filled 2020-12-03: qty 100, 90d supply, fill #0
  Filled 2021-03-04: qty 100, 90d supply, fill #1
  Filled 2021-06-24: qty 100, 90d supply, fill #2

## 2020-09-27 MED ORDER — SEMAGLUTIDE (2 MG/DOSE) 8 MG/3ML ~~LOC~~ SOPN
2.0000 mg | PEN_INJECTOR | SUBCUTANEOUS | 1 refills | Status: DC
  Filled 2020-09-27: qty 3, 28d supply, fill #0
  Filled 2020-10-24: qty 3, 28d supply, fill #1

## 2020-09-27 MED ORDER — AMLODIPINE BESYLATE 10 MG PO TABS
ORAL_TABLET | Freq: Every day | ORAL | 1 refills | Status: DC
  Filled 2020-09-27: qty 90, fill #0
  Filled 2020-12-16: qty 90, 90d supply, fill #0
  Filled 2021-03-09: qty 90, 90d supply, fill #1

## 2020-09-27 MED ORDER — ERGOCALCIFEROL 1.25 MG (50000 UT) PO CAPS
50000.0000 [IU] | ORAL_CAPSULE | ORAL | 3 refills | Status: DC
  Filled 2020-09-27: qty 12, 84d supply, fill #0
  Filled 2020-12-16: qty 12, 84d supply, fill #1
  Filled 2021-03-09: qty 12, 84d supply, fill #2
  Filled 2021-06-24: qty 12, 84d supply, fill #3

## 2020-09-27 MED ORDER — ESZOPICLONE 2 MG PO TABS
2.0000 mg | ORAL_TABLET | Freq: Every evening | ORAL | 0 refills | Status: DC | PRN
  Filled 2020-09-27 – 2020-10-01 (×2): qty 30, 30d supply, fill #0

## 2020-09-27 NOTE — Progress Notes (Signed)
Virtual Visit via [place]  Note  This visit type was conducted due to national recommendations for restrictions regarding the COVID-19 pandemic (e.g. social distancing).  This format is felt to be most appropriate for this patient at this time.  All issues noted in this document were discussed and addressed.  No physical exam was performed (except for noted visual exam findings with Video Visits).   I connected withNAME@ on 09/27/20 at  4:00 PM EDT by a video enabled telemedicine application  and verified that I am speaking with the correct person using two identifiers. Location patient: home Location provider: work or home office Persons participating in the virtual visit: patient, provider  I discussed the limitations, risks, security and privacy concerns of performing an evaluation and management service by telephone and the availability of in person appointments. I also discussed with the patient that there may be a patient responsible charge related to this service. The patient expressed understanding and agreed to proceed.   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  He has recently transitioned from Trulicity to [facility] and is tolerating the lower dose without nausea .  Fasting sugars remain over 140  And A1c has risen to 7.8.  He denies any low blood sugars.  Not exercising .  Taking Jardiance, glimepiride 1 mg daily , metformin 1500 mg daily .  Taking a statin and ARB   ROS: See pertinent positives and negatives per HPI.  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   Diabetes mellitus, type 2 ([place])    Diverticulosis    At Age 64 on colonoscopy   GERD (gastroesophageal reflux disease)    Gout    Hyperlipemia    Hypertension    Insomnia 04/27/2010   Nephrolithiasis    Obstructive sleep apnea 04/27/2010   OSA on CPAP     Reactive airway disease    Right ureteral stone 10/25/2017   Sleep apnea    wears cpap    Tinea cruris     Past Surgical History:  Procedure Laterality Date   COLONOSCOPY     last 11 yrs    cystoscopy  09/2008   & stone removal   CYSTOSCOPY/URETEROSCOPY/HOLMIUM LASER/STENT PLACEMENT Right 08/19/2017   Procedure: CYSTOSCOPY/RIGHT RETROGRADE RIGHT URETEROSCOPY/ RIGHT URETERAL STENT PLACEMENT;  Surgeon: Christopher Bring, MD;  Location: [facility];  Service: Urology;  Laterality: Right;   SURGERY SCROTAL / TESTICULAR N/A 1966   for undescended testicle    Family History  Problem Relation Age of Onset   Heart disease Mother    Deep vein thrombosis Father    Colon cancer Neg Hx    Colon polyps Neg Hx    Esophageal cancer Neg Hx    Rectal cancer Neg Hx    Stomach cancer Neg Hx     SOCIAL HX:  reports that he has never smoked. He has never used smokeless tobacco. He reports that he does not drink alcohol and does not use drugs.    Current Outpatient Medications:    allopurinol (ZYLOPRIM) 300 MG tablet, TAKE 1 TABLET BY MOUTH DAILY., Disp: 90 tablet, Rfl: 3   aspirin 81 MG tablet, Take 81 mg by mouth every other day., Disp: , Rfl:    celecoxib (CELEBREX) 200 MG capsule, TAKE 1 CAPSULE BY MOUTH 2 TIMES DAILY., Disp: 180 capsule,  Rfl: 1   eszopiclone (LUNESTA) 2 MG TABS tablet, Take 1 tablet (2 mg total) by mouth immediately before bedtime as needed for sleep., Disp: 30 tablet, Rfl: 0   glimepiride (AMARYL) 1 MG tablet, TAKE 1 TABLET BY MOUTH ONCE A DAY WITH DINNER, Disp: 90 tablet, Rfl: 1   glucose monitoring kit (FREESTYLE) monitoring kit, 1 each by Does not apply route as needed for other. FREESTYLE LITE, Disp: 1 each, Rfl: 1   Lancets (FREESTYLE) lancets, USE TO CHECK BLOOD SUGAR ONCE DAILY, Disp: 100 each, Rfl: 2   metFORMIN (GLUCOPHAGE) 500 MG tablet, TAKE 2 TABLETS BY MOUTH TWICE DAILY, AND 1 TABLET AT LUNCH, Disp: 150 tablet, Rfl: 3   pantoprazole (PROTONIX) 40 MG tablet, TAKE 1 TABLET  BY MOUTH DAILY., Disp: 90 tablet, Rfl: 3   rosuvastatin (CRESTOR) 10 MG tablet, TAKE 1 TABLET BY MOUTH EVERY OTHER DAY, Disp: 45 tablet, Rfl: 1   Semaglutide, 2 MG/DOSE, 8 MG/3ML SOPN, Inject 2 mg as directed once a week., Disp: 3 mL, Rfl: 1   telmisartan (MICARDIS) 80 MG tablet, TAKE 1 TABLET BY MOUTH ONCE A DAY, Disp: 90 tablet, Rfl: 1   terazosin (HYTRIN) 5 MG capsule, TAKE 1 CAPSULE BY MOUTH AT BEDTIME, Disp: 90 capsule, Rfl: 3   AMBULATORY NON FORMULARY MEDICATION, Place 30 g rectally 2 (two) times daily. Medication Name: Diltiazem 2% / lidocaine 5%  Cream 50/50 mix  Insert into anal canal twice daily (Patient not taking: Reported on 09/27/2020), Disp: 30 g, Rfl: 3   amLODipine (NORVASC) 10 MG tablet, TAKE 1 TABLET BY MOUTH ONCE DAILY, Disp: 90 tablet, Rfl: 1   empagliflozin (JARDIANCE) 25 MG TABS tablet, TAKE 1 TABLET BY MOUTH DAILY., Disp: 90 tablet, Rfl: 0   ergocalciferol (DRISDOL) 1.25 MG (50000 UT) capsule, Take 1 capsule (50,000 Units total) by mouth once a week., Disp: 12 capsule, Rfl: 3   glucose blood test strip, USE TO CHECK BLOOD SUGAR ONCE DAILY, Disp: 100 strip, Rfl: 2  Current Facility-Administered Medications:    0.9 %  sodium chloride infusion, 500 mL, Intravenous, Once, Christopher Mayer, MD  EXAMTonette Stewart per patient if applicable:  GENERAL: alert, oriented, appears well and in no acute distress  HEENT: atraumatic, conjunttiva clear, no obvious abnormalities on inspection of external nose and ears  NECK: normal movements of the head and neck  LUNGS: on inspection no signs of respiratory distress, breathing rate appears normal, no obvious gross SOB, gasping or wheezing  CV: no obvious cyanosis  MS: moves all visible extremities without noticeable abnormality  PSYCH/NEURO: pleasant and cooperative, no obvious depression or anxiety, speech and thought processing grossly intact  ASSESSMENT AND PLAN:  Discussed the following assessment and plan:  Obesity, diabetes,  and hypertension syndrome (HCC)  Obstructive sleep apnea  Obesity, diabetes, and hypertension syndrome ([place]) Not at goal . Advise to increase glimepiride to 2 mg daily and increase Ozempic  Dose.continue statin and ARB  Encouraged to walk.   Obstructive sleep apnea Diagnosed by sleep study. He  is wearing his CPAP every night a minimum of 6 hours per night and notes improved daytime wakefulness and decreased fatigue     I discussed the assessment and treatment plan with the patient. The patient was provided an opportunity to ask questions and all were answered. The patient agreed with the plan and demonstrated an understanding of the instructions.   The patient was advised to call back or seek an in-person evaluation if the symptoms worsen  or if the condition fails to improve as anticipated.  OSA: on CPAP . Last report from August 2022 was sent from [place] to [facility]    I spent 30 minutes dedicated to the care of this patient on the date of this encounter to include pre-visit review of his medical history,  Face-to-face time with the patient , and post visit ordering of testing and therapeutics.    Crecencio Mc, MD

## 2020-09-27 NOTE — Patient Instructions (Signed)
Increase glimepiride to 2 mg daily    Increase Ozempic to 2 mg weekly  Decrease lunesta dose to 2 mg nightly as a 30 day trial   Labs due on or after Dec 9   We will request CPAP compliance report from [employer]

## 2020-09-28 ENCOUNTER — Telehealth: Payer: Self-pay

## 2020-09-28 NOTE — Telephone Encounter (Signed)
Letter has been faxed to [employer] per verbal form Dr. Derrel Nip to request CPA compliance

## 2020-09-29 NOTE — Assessment & Plan Note (Addendum)
Not at goal . Advise to increase glimepiride to 2 mg daily and increase Ozempic  Dose.continue statin and ARB  Encouraged to walk.

## 2020-09-29 NOTE — Assessment & Plan Note (Signed)
Diagnosed by sleep study. He  is wearing his CPAP every night a minimum of 6 hours per night and notes improved daytime wakefulness and decreased fatigue

## 2020-10-01 ENCOUNTER — Other Ambulatory Visit: Payer: Self-pay | Admitting: Internal Medicine

## 2020-10-01 ENCOUNTER — Other Ambulatory Visit (HOSPITAL_COMMUNITY): Payer: Self-pay

## 2020-10-01 MED ORDER — CELECOXIB 200 MG PO CAPS
ORAL_CAPSULE | Freq: Two times a day (BID) | ORAL | 1 refills | Status: DC
  Filled 2020-10-01: qty 180, 90d supply, fill #0
  Filled 2020-12-29: qty 180, 90d supply, fill #1

## 2020-10-01 NOTE — Telephone Encounter (Signed)
Patient called about MyChart note from today about a refill.

## 2020-10-04 ENCOUNTER — Other Ambulatory Visit (HOSPITAL_COMMUNITY): Payer: Self-pay

## 2020-10-04 MED ORDER — GLIMEPIRIDE 1 MG PO TABS
1.0000 mg | ORAL_TABLET | Freq: Two times a day (BID) | ORAL | 1 refills | Status: DC
  Filled 2020-10-04 – 2020-10-16 (×4): qty 180, 90d supply, fill #0
  Filled 2021-01-17 – 2021-01-23 (×2): qty 180, 90d supply, fill #1

## 2020-10-15 ENCOUNTER — Other Ambulatory Visit (HOSPITAL_COMMUNITY): Payer: Self-pay

## 2020-10-15 MED FILL — Terazosin HCl Cap 5 MG (Base Equivalent): ORAL | 90 days supply | Qty: 90 | Fill #1 | Status: AC

## 2020-10-16 ENCOUNTER — Other Ambulatory Visit (HOSPITAL_COMMUNITY): Payer: Self-pay

## 2020-10-22 ENCOUNTER — Other Ambulatory Visit (HOSPITAL_COMMUNITY): Payer: Self-pay

## 2020-10-24 ENCOUNTER — Other Ambulatory Visit (HOSPITAL_COMMUNITY): Payer: Self-pay

## 2020-10-25 ENCOUNTER — Telehealth: Payer: Self-pay | Admitting: Internal Medicine

## 2020-10-25 DIAGNOSIS — E785 Hyperlipidemia, unspecified: Secondary | ICD-10-CM

## 2020-10-25 DIAGNOSIS — Z79899 Other long term (current) drug therapy: Secondary | ICD-10-CM

## 2020-10-25 DIAGNOSIS — E1165 Type 2 diabetes mellitus with hyperglycemia: Secondary | ICD-10-CM

## 2020-10-25 DIAGNOSIS — Z125 Encounter for screening for malignant neoplasm of prostate: Secondary | ICD-10-CM

## 2020-10-25 NOTE — Telephone Encounter (Signed)
Pt would like to have lab results in December. I have ordered A1c, CMP, and lipid panel. Is there anything else that needs to be ordered?

## 2020-10-25 NOTE — Telephone Encounter (Signed)
Patient called and wanted to make a lab appointment in December. No labs are in chart.

## 2020-10-26 ENCOUNTER — Other Ambulatory Visit (HOSPITAL_COMMUNITY): Payer: Self-pay

## 2020-10-29 NOTE — Telephone Encounter (Signed)
Patient is calling in to check on the status of the message below.Patient is scheduled for a lab appointment on 12/10.

## 2020-11-05 ENCOUNTER — Other Ambulatory Visit: Payer: Self-pay | Admitting: Internal Medicine

## 2020-11-05 ENCOUNTER — Other Ambulatory Visit (HOSPITAL_COMMUNITY): Payer: Self-pay

## 2020-11-05 MED ORDER — METFORMIN HCL 500 MG PO TABS
ORAL_TABLET | ORAL | 3 refills | Status: DC
  Filled 2020-11-05: qty 150, 30d supply, fill #0
  Filled 2020-12-03: qty 150, 30d supply, fill #1
  Filled 2021-01-01: qty 150, 30d supply, fill #2
  Filled 2021-02-02: qty 150, 30d supply, fill #3

## 2020-11-05 MED ORDER — ESZOPICLONE 2 MG PO TABS
2.0000 mg | ORAL_TABLET | Freq: Every evening | ORAL | 0 refills | Status: DC | PRN
  Filled 2020-11-05: qty 30, 30d supply, fill #0

## 2020-11-05 NOTE — Telephone Encounter (Signed)
RX Refill: lunesta Last Seen: 09-27-20 Last Ordered: 09-27-20 Next Appt: 12-28-20

## 2020-11-06 ENCOUNTER — Other Ambulatory Visit (HOSPITAL_COMMUNITY): Payer: Self-pay

## 2020-11-19 DIAGNOSIS — H04123 Dry eye syndrome of bilateral lacrimal glands: Secondary | ICD-10-CM | POA: Diagnosis not present

## 2020-11-19 DIAGNOSIS — E119 Type 2 diabetes mellitus without complications: Secondary | ICD-10-CM | POA: Diagnosis not present

## 2020-11-19 DIAGNOSIS — H10413 Chronic giant papillary conjunctivitis, bilateral: Secondary | ICD-10-CM | POA: Diagnosis not present

## 2020-11-19 LAB — HM DIABETES EYE EXAM

## 2020-11-20 DIAGNOSIS — G4733 Obstructive sleep apnea (adult) (pediatric): Secondary | ICD-10-CM | POA: Diagnosis not present

## 2020-11-27 ENCOUNTER — Other Ambulatory Visit (HOSPITAL_COMMUNITY): Payer: Self-pay

## 2020-11-29 ENCOUNTER — Other Ambulatory Visit: Payer: Self-pay | Admitting: Internal Medicine

## 2020-11-29 ENCOUNTER — Other Ambulatory Visit (HOSPITAL_COMMUNITY): Payer: Self-pay

## 2020-11-29 MED ORDER — OZEMPIC (2 MG/DOSE) 8 MG/3ML ~~LOC~~ SOPN
2.0000 mg | PEN_INJECTOR | SUBCUTANEOUS | 1 refills | Status: DC
  Filled 2020-11-29: qty 3, 28d supply, fill #0
  Filled 2020-12-29: qty 3, 28d supply, fill #1

## 2020-12-03 ENCOUNTER — Other Ambulatory Visit: Payer: Self-pay | Admitting: Internal Medicine

## 2020-12-03 ENCOUNTER — Other Ambulatory Visit (HOSPITAL_COMMUNITY): Payer: Self-pay

## 2020-12-03 MED ORDER — ESZOPICLONE 2 MG PO TABS
2.0000 mg | ORAL_TABLET | Freq: Every evening | ORAL | 5 refills | Status: DC | PRN
  Filled 2020-12-03: qty 30, 30d supply, fill #0
  Filled 2021-01-01: qty 30, 30d supply, fill #1
  Filled 2021-02-02: qty 30, 30d supply, fill #2
  Filled 2021-03-04: qty 30, 30d supply, fill #3
  Filled 2021-03-25 – 2021-03-29 (×2): qty 30, 30d supply, fill #4
  Filled 2021-05-10: qty 30, 30d supply, fill #5

## 2020-12-03 NOTE — Telephone Encounter (Signed)
Refilled: 11/05/2020 Last OV: 09/27/2020 Next OV: 12/28/2020

## 2020-12-17 ENCOUNTER — Other Ambulatory Visit (HOSPITAL_COMMUNITY): Payer: Self-pay

## 2020-12-24 ENCOUNTER — Other Ambulatory Visit: Payer: Self-pay

## 2020-12-24 ENCOUNTER — Other Ambulatory Visit (INDEPENDENT_AMBULATORY_CARE_PROVIDER_SITE_OTHER): Payer: 59

## 2020-12-24 DIAGNOSIS — E785 Hyperlipidemia, unspecified: Secondary | ICD-10-CM | POA: Diagnosis not present

## 2020-12-24 DIAGNOSIS — E1165 Type 2 diabetes mellitus with hyperglycemia: Secondary | ICD-10-CM

## 2020-12-24 DIAGNOSIS — Z79899 Other long term (current) drug therapy: Secondary | ICD-10-CM

## 2020-12-24 DIAGNOSIS — Z125 Encounter for screening for malignant neoplasm of prostate: Secondary | ICD-10-CM | POA: Diagnosis not present

## 2020-12-24 LAB — CBC WITH DIFFERENTIAL/PLATELET
Basophils Absolute: 0.1 10*3/uL (ref 0.0–0.1)
Basophils Relative: 0.8 % (ref 0.0–3.0)
Eosinophils Absolute: 0.4 10*3/uL (ref 0.0–0.7)
Eosinophils Relative: 5.2 % — ABNORMAL HIGH (ref 0.0–5.0)
HCT: 42.2 % (ref 39.0–52.0)
Hemoglobin: 13.9 g/dL (ref 13.0–17.0)
Lymphocytes Relative: 27.7 % (ref 12.0–46.0)
Lymphs Abs: 2 10*3/uL (ref 0.7–4.0)
MCHC: 33 g/dL (ref 30.0–36.0)
MCV: 90.1 fl (ref 78.0–100.0)
Monocytes Absolute: 0.6 10*3/uL (ref 0.1–1.0)
Monocytes Relative: 7.7 % (ref 3.0–12.0)
Neutro Abs: 4.2 10*3/uL (ref 1.4–7.7)
Neutrophils Relative %: 58.6 % (ref 43.0–77.0)
Platelets: 290 10*3/uL (ref 150.0–400.0)
RBC: 4.68 Mil/uL (ref 4.22–5.81)
RDW: 16 % — ABNORMAL HIGH (ref 11.5–15.5)
WBC: 7.2 10*3/uL (ref 4.0–10.5)

## 2020-12-24 LAB — COMPREHENSIVE METABOLIC PANEL
ALT: 53 U/L (ref 0–53)
AST: 31 U/L (ref 0–37)
Albumin: 3.8 g/dL (ref 3.5–5.2)
Alkaline Phosphatase: 80 U/L (ref 39–117)
BUN: 19 mg/dL (ref 6–23)
CO2: 24 mEq/L (ref 19–32)
Calcium: 9.5 mg/dL (ref 8.4–10.5)
Chloride: 105 mEq/L (ref 96–112)
Creatinine, Ser: 1.07 mg/dL (ref 0.40–1.50)
GFR: 73.2 mL/min (ref >60.00)
Glucose, Bld: 122 mg/dL — ABNORMAL HIGH (ref 70–99)
Potassium: 4.1 mEq/L (ref 3.5–5.1)
Sodium: 140 mEq/L (ref 135–145)
Total Bilirubin: 0.4 mg/dL (ref 0.2–1.2)
Total Protein: 6.3 g/dL (ref 6.0–8.3)

## 2020-12-24 LAB — LIPID PANEL
Cholesterol: 112 mg/dL (ref 0–200)
HDL: 24.5 mg/dL — ABNORMAL LOW (ref >39.00)
NonHDL: 87.07
Total CHOL/HDL Ratio: 5
Triglycerides: 322 mg/dL — ABNORMAL HIGH (ref 0.0–149.0)
VLDL: 64.4 mg/dL — ABNORMAL HIGH (ref 0.0–40.0)

## 2020-12-24 LAB — PSA: PSA: 0.41 ng/mL (ref 0.10–4.00)

## 2020-12-24 LAB — LDL CHOLESTEROL, DIRECT: Direct LDL: 64 mg/dL

## 2020-12-24 LAB — HEMOGLOBIN A1C: Hgb A1c MFr Bld: 7.1 % — ABNORMAL HIGH (ref 4.6–6.5)

## 2020-12-28 ENCOUNTER — Ambulatory Visit: Payer: 59 | Admitting: Internal Medicine

## 2020-12-28 ENCOUNTER — Other Ambulatory Visit (HOSPITAL_COMMUNITY): Payer: Self-pay

## 2020-12-28 ENCOUNTER — Other Ambulatory Visit: Payer: Self-pay

## 2020-12-28 ENCOUNTER — Encounter: Payer: Self-pay | Admitting: Internal Medicine

## 2020-12-28 VITALS — BP 134/70 | HR 94 | Temp 98.3°F | Ht 70.0 in | Wt 299.6 lb

## 2020-12-28 DIAGNOSIS — E1159 Type 2 diabetes mellitus with other circulatory complications: Secondary | ICD-10-CM

## 2020-12-28 DIAGNOSIS — M25552 Pain in left hip: Secondary | ICD-10-CM

## 2020-12-28 DIAGNOSIS — E1169 Type 2 diabetes mellitus with other specified complication: Secondary | ICD-10-CM

## 2020-12-28 DIAGNOSIS — I152 Hypertension secondary to endocrine disorders: Secondary | ICD-10-CM

## 2020-12-28 DIAGNOSIS — E669 Obesity, unspecified: Secondary | ICD-10-CM | POA: Diagnosis not present

## 2020-12-28 DIAGNOSIS — I10 Essential (primary) hypertension: Secondary | ICD-10-CM | POA: Diagnosis not present

## 2020-12-28 MED ORDER — TELMISARTAN 80 MG PO TABS
ORAL_TABLET | Freq: Every day | ORAL | 1 refills | Status: DC
  Filled 2020-12-28: qty 90, 90d supply, fill #0
  Filled 2021-03-29: qty 90, 90d supply, fill #1

## 2020-12-28 MED ORDER — OSELTAMIVIR PHOSPHATE 75 MG PO CAPS
75.0000 mg | ORAL_CAPSULE | Freq: Two times a day (BID) | ORAL | 0 refills | Status: DC
  Filled 2020-12-28: qty 10, 5d supply, fill #0

## 2020-12-28 MED ORDER — EMPAGLIFLOZIN 25 MG PO TABS
ORAL_TABLET | Freq: Every day | ORAL | 1 refills | Status: DC
  Filled 2020-12-28: qty 90, fill #0
  Filled 2021-03-04: qty 90, 90d supply, fill #0
  Filled 2021-05-29 (×2): qty 90, 90d supply, fill #1

## 2020-12-28 MED ORDER — ROSUVASTATIN CALCIUM 10 MG PO TABS
ORAL_TABLET | ORAL | 1 refills | Status: DC
  Filled 2020-12-28: qty 45, 90d supply, fill #0
  Filled 2021-03-29: qty 45, 90d supply, fill #1

## 2020-12-28 NOTE — Progress Notes (Signed)
Subjective:  Patient ID: Christopher Stewart, male    DOB: [birth year]  Age: 65 y.o. MRN: [id number]  CC: The primary encounter diagnosis was Left hip pain. Diagnoses of Obesity, diabetes, and hypertension syndrome ([place]) and Essential hypertension were also pertinent to this visit.  HPI AVROM ROBARTS presents for follow up on  Chief Complaint  Patient presents with   Follow-up    Diabetes, hypertension    This visit occurred during the SARS-CoV-2 public health emergency.  Safety protocols were in place, including screening questions prior to the visit, additional usage of staff PPE, and extensive cleaning of exam room while observing appropriate contact time as indicated for disinfecting solutions.   Hypertension: patient checks blood pressure daily at home.  Readings have been  consistently < 140/80 at rest . Patient is following a reduce salt diet most days and is taking medications as prescribed    Diabets:  He feels generally well, is walking several times per week and checking blood sugars once daily at variable times.  BS have been under 130 fasting and < 150 post prandially.  Denies any recent hypoglyemic events.  Taking  metformin,  ozempic , glimepiride, and jardiance as directed. Following a carbohydrate modified diet 6 days per week. Denies numbness, burning and tingling of extremities. Appetite is good.  Taking telmisartan and crestor   New onset left hip pain overnight. No history of unusual activity,  Took 80 mg prednisone last night .  Still taking celebrex   Had shingrx in October    Outpatient Medications Prior to Visit  Medication Sig Dispense Refill   allopurinol (ZYLOPRIM) 300 MG tablet TAKE 1 TABLET BY MOUTH DAILY. 90 tablet 3   AMBULATORY NON FORMULARY MEDICATION Place 30 g rectally 2 (two) times daily. Medication Name: Diltiazem 2% / lidocaine 5%  Cream 50/50 mix  Insert into anal canal twice daily 30 g 3   amLODipine (NORVASC) 10 MG tablet TAKE 1 TABLET BY  MOUTH ONCE DAILY 90 tablet 1   aspirin 81 MG tablet Take 81 mg by mouth every other day.     celecoxib (CELEBREX) 200 MG capsule TAKE 1 CAPSULE BY MOUTH 2 TIMES DAILY. 180 capsule 1   ergocalciferol (DRISDOL) 1.25 MG (50000 UT) capsule Take 1 capsule (50,000 Units total) by mouth once a week. 12 capsule 3   eszopiclone (LUNESTA) 2 MG TABS tablet Take 1 tablet (2 mg total) by mouth immediately before bedtime as needed for sleep. 30 tablet 5   glimepiride (AMARYL) 1 MG tablet Take 1 tablet (1 mg total) by mouth 2 (two) times daily. 180 tablet 1   glucose blood test strip USE TO CHECK BLOOD SUGAR ONCE DAILY 100 strip 2   glucose monitoring kit (FREESTYLE) monitoring kit 1 each by Does not apply route as needed for other. FREESTYLE LITE 1 each 1   Lancets (FREESTYLE) lancets USE TO CHECK BLOOD SUGAR ONCE DAILY 100 each 2   metFORMIN (GLUCOPHAGE) 500 MG tablet TAKE 2 TABLETS BY MOUTH TWICE DAILY, AND 1 TABLET AT LUNCH 150 tablet 3   pantoprazole (PROTONIX) 40 MG tablet TAKE 1 TABLET BY MOUTH DAILY. 90 tablet 3   Semaglutide, 2 MG/DOSE, (OZEMPIC, 2 MG/DOSE,) 8 MG/3ML SOPN Inject 2 mg as directed once a week. 3 mL 1   terazosin (HYTRIN) 5 MG capsule TAKE 1 CAPSULE BY MOUTH AT BEDTIME 90 capsule 3   empagliflozin (JARDIANCE) 25 MG TABS tablet TAKE 1 TABLET BY MOUTH DAILY. 90 tablet 0  rosuvastatin (CRESTOR) 10 MG tablet TAKE 1 TABLET BY MOUTH EVERY OTHER DAY 45 tablet 1   telmisartan (MICARDIS) 80 MG tablet TAKE 1 TABLET BY MOUTH ONCE A DAY 90 tablet 1   Facility-Administered Medications Prior to Visit  Medication Dose Route Frequency Provider Last Rate Last Admin   0.9 %  sodium chloride infusion  500 mL Intravenous Once Gatha Mayer, MD        Review of Systems;  Patient denies headache, fevers, malaise, unintentional weight loss, skin rash, eye pain, sinus congestion and sinus pain, sore throat, dysphagia,  hemoptysis , cough, dyspnea, wheezing, chest pain, palpitations, orthopnea, edema,  abdominal pain, nausea, melena, diarrhea, constipation, flank pain, dysuria, hematuria, urinary  Frequency, nocturia, numbness, tingling, seizures,  Focal weakness, Loss of consciousness,  Tremor, insomnia, depression, anxiety, and suicidal ideation.      Objective:  BP 134/70 (BP Location: Left Arm, Patient Position: Sitting, Cuff Size: Large)    Pulse 94    Temp 98.3 F (36.8 C) (Oral)    Ht 5' 10"  (1.778 m)    Wt 299 lb 9.6 oz (135.9 kg)    SpO2 98%    BMI 42.99 kg/m   BP Readings from Last 3 Encounters:  12/28/20 134/70  09/27/20 120/80  06/15/20 124/74    Wt Readings from Last 3 Encounters:  12/28/20 299 lb 9.6 oz (135.9 kg)  09/27/20 298 lb (135.2 kg)  06/15/20 300 lb (136.1 kg)    General appearance: alert, cooperative and appears stated age Ears: normal TM's and external ear canals both ears Throat: lips, mucosa, and tongue normal; teeth and gums normal Neck: no adenopathy, no carotid bruit, supple, symmetrical, trachea midline and thyroid not enlarged, symmetric, no tenderness/mass/nodules Back: symmetric, no curvature. ROM normal. No CVA tenderness. Lungs: clear to auscultation bilaterally Heart: regular rate and rhythm, S1, S2 normal, no murmur, click, rub or gallop Abdomen: soft, non-tender; bowel sounds normal; no masses,  no organomegaly Pulses: 2+ and symmetric Skin: Skin color, texture, turgor normal. No rashes or lesions Lymph nodes: Cervical, supraclavicular, and axillary nodes normal.  Lab Results  Component Value Date   HGBA1C 7.1 (H) 12/24/2020   HGBA1C 7.8 (H) 09/21/2020   HGBA1C 7.4 (H) 06/15/2020    Lab Results  Component Value Date   CREATININE 1.07 12/24/2020   CREATININE 1.18 09/21/2020   CREATININE 1.17 06/15/2020    Lab Results  Component Value Date   WBC 7.2 12/24/2020   HGB 13.9 12/24/2020   HCT 42.2 12/24/2020   PLT 290.0 12/24/2020   GLUCOSE 122 (H) 12/24/2020   CHOL 112 12/24/2020   TRIG 322.0 (H) 12/24/2020   HDL 24.50 (L)  12/24/2020   LDLDIRECT 64.0 12/24/2020   LDLCALC 63 09/21/2020   ALT 53 12/24/2020   AST 31 12/24/2020   NA 140 12/24/2020   K 4.1 12/24/2020   CL 105 12/24/2020   CREATININE 1.07 12/24/2020   BUN 19 12/24/2020   CO2 24 12/24/2020   TSH 1.62 10/23/2017   PSA 0.41 12/24/2020   HGBA1C 7.1 (H) 12/24/2020   MICROALBUR 0.2 06/15/2020    DG C-Arm 1-60 Min-No Report  Result Date: 08/19/2017 Fluoroscopy was utilized by the requesting physician.  No radiographic interpretation.    Assessment & Plan:   Problem List Items Addressed This Visit     Obesity, diabetes, and hypertension syndrome ([place])    Improved control with  increased dose of  glimepiride to 2 mg daily and increased dose of  Ozempic  continue statin and ARB  Encouraged to start walking .   Lab Results  Component Value Date   HGBA1C 7.1 (H) 12/24/2020   Lab Results  Component Value Date   CREATININE 1.07 12/24/2020   Lab Results  Component Value Date   MICROALBUR 0.2 06/15/2020   MICROALBUR <0.7 03/28/2019           Relevant Medications   empagliflozin (JARDIANCE) 25 MG TABS tablet   rosuvastatin (CRESTOR) 10 MG tablet   telmisartan (MICARDIS) 80 MG tablet   Other Relevant Orders   Hemoglobin A1c   Comprehensive metabolic panel   Lipid panel   Essential hypertension    Well controlled on current regimen. Renal function stable, no changes today.  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 I am also having him maintain his aspirin, glucose monitoring kit,  AMBULATORY NON FORMULARY MEDICATION, terazosin, allopurinol, pantoprazole, freestyle, amLODipine, glucose blood, ergocalciferol, celecoxib, glimepiride, metFORMIN, Ozempic (2 MG/DOSE), eszopiclone, empagliflozin, rosuvastatin, and telmisartan. We will continue to administer sodium chloride.  Meds ordered this encounter  Medications   empagliflozin (JARDIANCE) 25 MG TABS tablet    Sig: TAKE 1 TABLET BY MOUTH DAILY.    Dispense:  90 tablet    Refill:  1   rosuvastatin (CRESTOR) 10 MG tablet    Sig: TAKE 1 TABLET BY MOUTH EVERY OTHER DAY    Dispense:  45 tablet    Refill:  1   telmisartan (MICARDIS) 80 MG tablet    Sig: TAKE 1 TABLET BY MOUTH ONCE A DAY    Dispense:  90 tablet    Refill:  1   oseltamivir (TAMIFLU) 75 MG capsule    Sig: Take 1 capsule by mouth 2 times daily.    Dispense:  10 capsule    Refill:  0     I provided  30 minutes of  face-to-face time during this encounter reviewing patient's current problems and past surgeries, labs and imaging studies, providing counseling on the above mentioned problems , and coordination  of care .   Follow-up: Return in about 6 months (around 06/28/2021) for follow up diabetes.   Crecencio Mc, MD

## 2020-12-28 NOTE — Patient Instructions (Signed)
Postpone your vaccines until you have been off of the prednisone  Due to Tdap and Shingrix

## 2020-12-30 NOTE — Assessment & Plan Note (Signed)
Well controlled on current regimen. Renal function stable, no changes today.  Lab Results  Component Value Date   CREATININE 1.07 12/24/2020   Lab Results  Component Value Date   NA 140 12/24/2020   K 4.1 12/24/2020   CL 105 12/24/2020   CO2 24 12/24/2020

## 2020-12-30 NOTE — Assessment & Plan Note (Signed)
Improved control with  increased dose of  glimepiride to 2 mg daily and increased dose of  Ozempic continue statin and ARB  Encouraged to start walking .   Lab Results  Component Value Date   HGBA1C 7.1 (H) 12/24/2020   Lab Results  Component Value Date   CREATININE 1.07 12/24/2020   Lab Results  Component Value Date   MICROALBUR 0.2 06/15/2020   MICROALBUR <0.7 03/28/2019

## 2020-12-31 ENCOUNTER — Other Ambulatory Visit (HOSPITAL_COMMUNITY): Payer: Self-pay

## 2021-01-02 ENCOUNTER — Other Ambulatory Visit (HOSPITAL_COMMUNITY): Payer: Self-pay

## 2021-01-10 ENCOUNTER — Other Ambulatory Visit (HOSPITAL_COMMUNITY): Payer: Self-pay

## 2021-01-10 MED FILL — Terazosin HCl Cap 5 MG (Base Equivalent): ORAL | 90 days supply | Qty: 90 | Fill #2 | Status: AC

## 2021-01-15 ENCOUNTER — Telehealth: Payer: Self-pay | Admitting: Internal Medicine

## 2021-01-15 NOTE — Telephone Encounter (Signed)
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  This is the study discussed I think was October meeting.

## 2021-01-17 ENCOUNTER — Other Ambulatory Visit (HOSPITAL_COMMUNITY): Payer: Self-pay

## 2021-01-23 ENCOUNTER — Other Ambulatory Visit (HOSPITAL_COMMUNITY): Payer: Self-pay

## 2021-01-23 ENCOUNTER — Other Ambulatory Visit: Payer: Self-pay | Admitting: Internal Medicine

## 2021-01-24 ENCOUNTER — Other Ambulatory Visit (HOSPITAL_COMMUNITY): Payer: Self-pay

## 2021-01-24 MED ORDER — OZEMPIC (2 MG/DOSE) 8 MG/3ML ~~LOC~~ SOPN
2.0000 mg | PEN_INJECTOR | SUBCUTANEOUS | 1 refills | Status: DC
  Filled 2021-01-24: qty 3, 28d supply, fill #0
  Filled 2021-02-25: qty 3, 28d supply, fill #1

## 2021-02-02 ENCOUNTER — Other Ambulatory Visit: Payer: Self-pay | Admitting: Internal Medicine

## 2021-02-04 ENCOUNTER — Other Ambulatory Visit (HOSPITAL_COMMUNITY): Payer: Self-pay

## 2021-02-05 ENCOUNTER — Other Ambulatory Visit (HOSPITAL_COMMUNITY): Payer: Self-pay

## 2021-02-05 MED ORDER — CELECOXIB 200 MG PO CAPS
ORAL_CAPSULE | Freq: Two times a day (BID) | ORAL | 1 refills | Status: DC
  Filled 2021-02-05: qty 180, fill #0
  Filled 2021-03-29: qty 180, 90d supply, fill #0
  Filled 2021-06-24: qty 180, 90d supply, fill #1

## 2021-02-14 ENCOUNTER — Other Ambulatory Visit (HOSPITAL_COMMUNITY): Payer: Self-pay

## 2021-02-20 ENCOUNTER — Other Ambulatory Visit: Payer: Self-pay

## 2021-02-25 ENCOUNTER — Other Ambulatory Visit (HOSPITAL_COMMUNITY): Payer: Self-pay

## 2021-03-04 ENCOUNTER — Other Ambulatory Visit: Payer: Self-pay | Admitting: Internal Medicine

## 2021-03-04 ENCOUNTER — Other Ambulatory Visit (HOSPITAL_COMMUNITY): Payer: Self-pay

## 2021-03-04 MED ORDER — METFORMIN HCL 500 MG PO TABS
ORAL_TABLET | ORAL | 3 refills | Status: DC
  Filled 2021-03-04: qty 150, 30d supply, fill #0
  Filled 2021-03-29: qty 150, 30d supply, fill #1
  Filled 2021-05-10: qty 150, 30d supply, fill #2
  Filled 2021-06-24: qty 150, 30d supply, fill #3

## 2021-03-05 ENCOUNTER — Other Ambulatory Visit (HOSPITAL_COMMUNITY): Payer: Self-pay

## 2021-03-09 ENCOUNTER — Other Ambulatory Visit (HOSPITAL_COMMUNITY): Payer: Self-pay

## 2021-03-25 ENCOUNTER — Other Ambulatory Visit: Payer: Self-pay | Admitting: Internal Medicine

## 2021-03-25 ENCOUNTER — Other Ambulatory Visit (HOSPITAL_COMMUNITY): Payer: Self-pay

## 2021-03-25 MED ORDER — OZEMPIC (2 MG/DOSE) 8 MG/3ML ~~LOC~~ SOPN
2.0000 mg | PEN_INJECTOR | SUBCUTANEOUS | 1 refills | Status: DC
  Filled 2021-03-25: qty 3, 28d supply, fill #0
  Filled 2021-04-22: qty 3, 28d supply, fill #1

## 2021-03-29 ENCOUNTER — Other Ambulatory Visit (HOSPITAL_COMMUNITY): Payer: Self-pay

## 2021-04-12 ENCOUNTER — Other Ambulatory Visit (HOSPITAL_COMMUNITY): Payer: Self-pay

## 2021-04-12 ENCOUNTER — Other Ambulatory Visit: Payer: Self-pay | Admitting: Internal Medicine

## 2021-04-15 ENCOUNTER — Other Ambulatory Visit (HOSPITAL_COMMUNITY): Payer: Self-pay

## 2021-04-15 MED ORDER — TERAZOSIN HCL 5 MG PO CAPS
ORAL_CAPSULE | Freq: Every day | ORAL | 3 refills | Status: DC
  Filled 2021-04-15: qty 90, 90d supply, fill #0
  Filled 2021-07-10: qty 90, 90d supply, fill #1
  Filled 2021-10-04: qty 90, 90d supply, fill #2
  Filled 2021-12-31: qty 90, 90d supply, fill #3

## 2021-04-22 ENCOUNTER — Other Ambulatory Visit: Payer: Self-pay | Admitting: Internal Medicine

## 2021-04-22 ENCOUNTER — Other Ambulatory Visit (HOSPITAL_COMMUNITY): Payer: Self-pay

## 2021-04-22 MED ORDER — GLIMEPIRIDE 1 MG PO TABS
1.0000 mg | ORAL_TABLET | Freq: Two times a day (BID) | ORAL | 1 refills | Status: DC
  Filled 2021-04-22: qty 180, 90d supply, fill #0
  Filled 2021-07-23: qty 180, 90d supply, fill #1

## 2021-04-23 ENCOUNTER — Other Ambulatory Visit (HOSPITAL_COMMUNITY): Payer: Self-pay

## 2021-04-30 ENCOUNTER — Other Ambulatory Visit (HOSPITAL_COMMUNITY): Payer: Self-pay

## 2021-05-10 ENCOUNTER — Other Ambulatory Visit (HOSPITAL_COMMUNITY): Payer: Self-pay

## 2021-05-12 ENCOUNTER — Encounter: Payer: Self-pay | Admitting: Internal Medicine

## 2021-05-21 ENCOUNTER — Other Ambulatory Visit (HOSPITAL_COMMUNITY): Payer: Self-pay

## 2021-05-21 ENCOUNTER — Other Ambulatory Visit: Payer: Self-pay | Admitting: Internal Medicine

## 2021-05-21 MED ORDER — OZEMPIC (2 MG/DOSE) 8 MG/3ML ~~LOC~~ SOPN
2.0000 mg | PEN_INJECTOR | SUBCUTANEOUS | 1 refills | Status: DC
  Filled 2021-05-21: qty 3, 28d supply, fill #0
  Filled 2021-06-20: qty 3, 28d supply, fill #1

## 2021-05-23 NOTE — Telephone Encounter (Signed)
Patient called to follow-up on request for CPAP supplies.  Please call him at [phone]. ?

## 2021-05-28 DIAGNOSIS — G4733 Obstructive sleep apnea (adult) (pediatric): Secondary | ICD-10-CM | POA: Diagnosis not present

## 2021-05-29 ENCOUNTER — Other Ambulatory Visit (HOSPITAL_COMMUNITY): Payer: Self-pay

## 2021-05-30 ENCOUNTER — Other Ambulatory Visit (HOSPITAL_COMMUNITY): Payer: Self-pay

## 2021-06-03 ENCOUNTER — Other Ambulatory Visit (HOSPITAL_COMMUNITY): Payer: Self-pay

## 2021-06-08 ENCOUNTER — Other Ambulatory Visit: Payer: Self-pay | Admitting: Internal Medicine

## 2021-06-11 ENCOUNTER — Other Ambulatory Visit (HOSPITAL_COMMUNITY): Payer: Self-pay

## 2021-06-11 MED ORDER — ESZOPICLONE 2 MG PO TABS
2.0000 mg | ORAL_TABLET | Freq: Every evening | ORAL | 5 refills | Status: DC | PRN
  Filled 2021-06-11: qty 30, 30d supply, fill #0
  Filled 2021-07-14: qty 30, 30d supply, fill #1
  Filled 2021-08-14: qty 30, 30d supply, fill #2
  Filled 2021-09-14: qty 30, 30d supply, fill #3
  Filled 2021-10-15: qty 30, 30d supply, fill #4
  Filled 2021-11-13: qty 30, 30d supply, fill #5

## 2021-06-11 MED ORDER — AMLODIPINE BESYLATE 10 MG PO TABS
ORAL_TABLET | Freq: Every day | ORAL | 1 refills | Status: DC
  Filled 2021-06-11: qty 90, 90d supply, fill #0
  Filled 2021-09-09: qty 90, 90d supply, fill #1

## 2021-06-11 NOTE — Telephone Encounter (Signed)
Refilled: 12/03/2020 Last OV: 12/28/2020  Next OV: 07/02/2021

## 2021-06-17 ENCOUNTER — Other Ambulatory Visit (HOSPITAL_COMMUNITY): Payer: Self-pay

## 2021-06-18 ENCOUNTER — Telehealth: Payer: Self-pay | Admitting: Internal Medicine

## 2021-06-18 DIAGNOSIS — E559 Vitamin D deficiency, unspecified: Secondary | ICD-10-CM

## 2021-06-18 NOTE — Telephone Encounter (Signed)
Patient called and is coming in on Friday, 06/21/2021. He would like to have his victim D checked.

## 2021-06-18 NOTE — Telephone Encounter (Signed)
Vitamin d lab has been added.

## 2021-06-21 ENCOUNTER — Other Ambulatory Visit: Payer: 59

## 2021-06-21 ENCOUNTER — Other Ambulatory Visit (INDEPENDENT_AMBULATORY_CARE_PROVIDER_SITE_OTHER): Payer: Medicare Other

## 2021-06-21 ENCOUNTER — Other Ambulatory Visit (HOSPITAL_COMMUNITY): Payer: Self-pay

## 2021-06-21 DIAGNOSIS — E559 Vitamin D deficiency, unspecified: Secondary | ICD-10-CM

## 2021-06-21 DIAGNOSIS — E1159 Type 2 diabetes mellitus with other circulatory complications: Secondary | ICD-10-CM

## 2021-06-21 DIAGNOSIS — E669 Obesity, unspecified: Secondary | ICD-10-CM

## 2021-06-21 DIAGNOSIS — E1169 Type 2 diabetes mellitus with other specified complication: Secondary | ICD-10-CM

## 2021-06-21 DIAGNOSIS — I152 Hypertension secondary to endocrine disorders: Secondary | ICD-10-CM

## 2021-06-21 LAB — COMPREHENSIVE METABOLIC PANEL
ALT: 49 U/L (ref 0–53)
AST: 27 U/L (ref 0–37)
Albumin: 3.8 g/dL (ref 3.5–5.2)
Alkaline Phosphatase: 59 U/L (ref 39–117)
BUN: 18 mg/dL (ref 6–23)
CO2: 26 mEq/L (ref 19–32)
Calcium: 9.6 mg/dL (ref 8.4–10.5)
Chloride: 104 mEq/L (ref 96–112)
Creatinine, Ser: 1.03 mg/dL (ref 0.40–1.50)
GFR: 76.37 mL/min (ref >60.00)
Glucose, Bld: 119 mg/dL — ABNORMAL HIGH (ref 70–99)
Potassium: 4.3 mEq/L (ref 3.5–5.1)
Sodium: 139 mEq/L (ref 135–145)
Total Bilirubin: 0.6 mg/dL (ref 0.2–1.2)
Total Protein: 6.4 g/dL (ref 6.0–8.3)

## 2021-06-21 LAB — LIPID PANEL
Cholesterol: 111 mg/dL (ref 0–200)
HDL: 26.3 mg/dL — ABNORMAL LOW (ref >39.00)
LDL Cholesterol: 56 mg/dL (ref 0–99)
NonHDL: 85.18
Total CHOL/HDL Ratio: 4
Triglycerides: 148 mg/dL (ref 0.0–149.0)
VLDL: 29.6 mg/dL (ref 0.0–40.0)

## 2021-06-21 LAB — VITAMIN D 25 HYDROXY (VIT D DEFICIENCY, FRACTURES): VITD: 42.66 ng/mL (ref 30.00–100.00)

## 2021-06-21 LAB — HEMOGLOBIN A1C: Hgb A1c MFr Bld: 7.1 % — ABNORMAL HIGH (ref 4.6–6.5)

## 2021-06-24 ENCOUNTER — Other Ambulatory Visit (HOSPITAL_COMMUNITY): Payer: Self-pay

## 2021-06-27 ENCOUNTER — Other Ambulatory Visit (HOSPITAL_COMMUNITY): Payer: Self-pay

## 2021-06-28 ENCOUNTER — Ambulatory Visit: Payer: 59 | Admitting: Internal Medicine

## 2021-06-28 DIAGNOSIS — G4733 Obstructive sleep apnea (adult) (pediatric): Secondary | ICD-10-CM | POA: Diagnosis not present

## 2021-07-02 ENCOUNTER — Other Ambulatory Visit (HOSPITAL_COMMUNITY): Payer: Self-pay

## 2021-07-02 ENCOUNTER — Ambulatory Visit (INDEPENDENT_AMBULATORY_CARE_PROVIDER_SITE_OTHER): Payer: Medicare Other | Admitting: Internal Medicine

## 2021-07-02 ENCOUNTER — Encounter: Payer: Self-pay | Admitting: Internal Medicine

## 2021-07-02 VITALS — BP 124/70 | HR 85 | Temp 98.0°F | Ht 70.0 in | Wt 297.4 lb

## 2021-07-02 DIAGNOSIS — E1159 Type 2 diabetes mellitus with other circulatory complications: Secondary | ICD-10-CM | POA: Diagnosis not present

## 2021-07-02 DIAGNOSIS — E785 Hyperlipidemia, unspecified: Secondary | ICD-10-CM | POA: Diagnosis not present

## 2021-07-02 DIAGNOSIS — I152 Hypertension secondary to endocrine disorders: Secondary | ICD-10-CM

## 2021-07-02 DIAGNOSIS — E1169 Type 2 diabetes mellitus with other specified complication: Secondary | ICD-10-CM | POA: Diagnosis not present

## 2021-07-02 DIAGNOSIS — E669 Obesity, unspecified: Secondary | ICD-10-CM

## 2021-07-02 MED ORDER — ONETOUCH DELICA LANCETS 33G MISC
0 refills | Status: DC
  Filled 2021-07-02: qty 100, 25d supply, fill #0

## 2021-07-02 MED ORDER — ERGOCALCIFEROL 1.25 MG (50000 UT) PO CAPS
50000.0000 [IU] | ORAL_CAPSULE | ORAL | 3 refills | Status: DC
  Filled 2021-07-02 – 2021-10-27 (×2): qty 12, 84d supply, fill #0
  Filled 2022-01-19: qty 12, 84d supply, fill #1
  Filled 2022-04-10: qty 12, 84d supply, fill #2

## 2021-07-02 MED ORDER — ALLOPURINOL 300 MG PO TABS
ORAL_TABLET | Freq: Every day | ORAL | 3 refills | Status: DC
  Filled 2021-07-02: qty 90, 90d supply, fill #0
  Filled 2021-10-04: qty 90, 90d supply, fill #1
  Filled 2022-02-20: qty 90, 90d supply, fill #2
  Filled 2022-05-14: qty 90, 90d supply, fill #3

## 2021-07-02 MED ORDER — TELMISARTAN 80 MG PO TABS
ORAL_TABLET | Freq: Every day | ORAL | 1 refills | Status: DC
  Filled 2021-07-02: qty 90, 90d supply, fill #0
  Filled 2021-10-04: qty 90, 90d supply, fill #1

## 2021-07-02 MED ORDER — OZEMPIC (2 MG/DOSE) 8 MG/3ML ~~LOC~~ SOPN
2.0000 mg | PEN_INJECTOR | SUBCUTANEOUS | 1 refills | Status: DC
  Filled 2021-07-02 – 2021-07-14 (×2): qty 3, 28d supply, fill #0
  Filled 2021-08-14 – 2021-10-12 (×2): qty 3, 28d supply, fill #1

## 2021-07-02 MED ORDER — GLUCOSE BLOOD VI STRP
ORAL_STRIP | 0 refills | Status: DC
  Filled 2021-07-02: qty 100, 25d supply, fill #0

## 2021-07-02 MED ORDER — EMPAGLIFLOZIN 25 MG PO TABS
ORAL_TABLET | Freq: Every day | ORAL | 1 refills | Status: DC
  Filled 2021-07-02: qty 90, fill #0
  Filled 2021-09-09: qty 90, 90d supply, fill #0
  Filled 2021-12-02: qty 90, 90d supply, fill #1

## 2021-07-02 MED ORDER — BLOOD GLUCOSE METER KIT
PACK | 0 refills | Status: AC
  Filled 2021-07-02: qty 1, 30d supply, fill #0

## 2021-07-02 MED ORDER — PANTOPRAZOLE SODIUM 40 MG PO TBEC
DELAYED_RELEASE_TABLET | Freq: Every day | ORAL | 3 refills | Status: DC
  Filled 2021-07-02: qty 90, 90d supply, fill #0
  Filled 2021-09-23: qty 90, 90d supply, fill #1
  Filled 2021-12-17: qty 90, 90d supply, fill #2
  Filled 2022-03-28: qty 90, 90d supply, fill #3

## 2021-07-02 MED ORDER — METFORMIN HCL 500 MG PO TABS
ORAL_TABLET | ORAL | 3 refills | Status: DC
  Filled 2021-07-02: qty 150, fill #0
  Filled 2021-08-14: qty 150, 30d supply, fill #0
  Filled 2021-09-23: qty 150, 30d supply, fill #1
  Filled 2021-10-27: qty 150, 30d supply, fill #2
  Filled 2021-12-02: qty 150, 30d supply, fill #3

## 2021-07-02 MED ORDER — EMPAGLIFLOZIN 25 MG PO TABS
25.0000 mg | ORAL_TABLET | Freq: Every day | ORAL | 1 refills | Status: DC
  Filled 2021-07-02: qty 90, fill #0
  Filled 2022-03-04: qty 90, 90d supply, fill #0
  Filled 2022-05-27 – 2022-05-29 (×2): qty 90, 90d supply, fill #1

## 2021-07-02 MED ORDER — OZEMPIC (2 MG/DOSE) 8 MG/3ML ~~LOC~~ SOPN
2.0000 mg | PEN_INJECTOR | SUBCUTANEOUS | 1 refills | Status: DC
Start: 2021-07-02 — End: 2021-12-30
  Filled 2021-07-02: qty 3, 28d supply, fill #0

## 2021-07-02 MED ORDER — ROSUVASTATIN CALCIUM 10 MG PO TABS
ORAL_TABLET | ORAL | 1 refills | Status: DC
  Filled 2021-07-02: qty 45, 90d supply, fill #0
  Filled 2021-12-16: qty 45, 90d supply, fill #1

## 2021-07-02 NOTE — Assessment & Plan Note (Signed)
Improved control with  increased dose of  glimepiride to 2 mg daily and increased dose of  Ozempic continue statin and ARB  Encouraged to start walking .   Lab Results  Component Value Date   HGBA1C 7.1 (H) 06/21/2021   Lab Results  Component Value Date   CREATININE 1.03 06/21/2021   Lab Results  Component Value Date   MICROALBUR 0.2 06/15/2020   MICROALBUR <0.7 03/28/2019

## 2021-07-02 NOTE — Progress Notes (Signed)
Subjective:  Patient ID: Christopher Stewart, male    DOB: [birth year]  Age: 66 y.o. MRN: [id number]  CC: The primary encounter diagnosis was Obesity, diabetes, and hypertension syndrome ([place]). A diagnosis of Hyperlipidemia LDL goal <100 was also pertinent to this visit.   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  Tolerating meds and does not want increase dose.  Tolerating  jardiance daily and amaryl.  Carbs in the morning   protein and greens,  the rest of the day   2) HTN  patient checks blood pressure twice weekly at home.  Readings have been for the most part < 130/80 at rest . Patient is following a reduce salt diet most days and is taking medications as prescribed    Outpatient Medications Prior to Visit  Medication Sig Dispense Refill   AMBULATORY NON FORMULARY MEDICATION Place 30 g rectally 2 (two) times daily. Medication Name: Diltiazem 2% / lidocaine 5%  Cream 50/50 mix  Insert into anal canal twice daily 30 g 3   amLODipine (NORVASC) 10 MG tablet TAKE 1 TABLET BY MOUTH ONCE DAILY 90 tablet 1   aspirin 81 MG tablet Take 81 mg by mouth every other day.     celecoxib (CELEBREX) 200 MG capsule TAKE 1 CAPSULE BY MOUTH 2 TIMES DAILY. 180 capsule 1   ergocalciferol (DRISDOL) 1.25 MG (50000 UT) capsule Take 1 capsule (50,000 Units total) by mouth once a week. 12 capsule 3   eszopiclone (LUNESTA) 2 MG TABS tablet Take 1 tablet (2 mg total) by mouth immediately before bedtime as needed for sleep. 30 tablet 5   glimepiride (AMARYL) 1 MG tablet Take 1 tablet (1 mg total) by mouth 2 (two) times daily. 180 tablet 1   glucose blood test strip USE TO CHECK BLOOD SUGAR ONCE DAILY 100 strip 2   glucose monitoring kit (FREESTYLE) monitoring kit 1 each  by Does not apply route as needed for other. FREESTYLE LITE 1 each 1   Lancets (FREESTYLE) lancets USE TO CHECK BLOOD SUGAR ONCE DAILY 100 each 2   oseltamivir (TAMIFLU) 75 MG capsule Take 1 capsule by mouth 2 times daily. 10 capsule 0   terazosin (HYTRIN) 5 MG capsule TAKE 1 CAPSULE BY MOUTH AT BEDTIME 90 capsule 3   allopurinol (ZYLOPRIM) 300 MG tablet TAKE 1 TABLET BY MOUTH DAILY. 90 tablet 3   empagliflozin (JARDIANCE) 25 MG TABS tablet TAKE 1 TABLET BY MOUTH DAILY. 90 tablet 1   metFORMIN (GLUCOPHAGE) 500 MG tablet TAKE 2 TABLETS BY MOUTH TWICE DAILY, AND 1 TABLET AT LUNCH 150 tablet 3   pantoprazole (PROTONIX) 40 MG tablet TAKE 1 TABLET BY MOUTH DAILY. 90 tablet 3   rosuvastatin (CRESTOR) 10 MG tablet TAKE 1 TABLET BY MOUTH EVERY OTHER DAY 45 tablet 1   Semaglutide, 2 MG/DOSE, (OZEMPIC, 2 MG/DOSE,) 8 MG/3ML SOPN Inject 2 mg once a week as directed. 3 mL 1   telmisartan (MICARDIS) 80 MG tablet TAKE 1 TABLET BY MOUTH ONCE A DAY 90 tablet 1   Facility-Administered Medications Prior to Visit  Medication Dose Route Frequency Provider Last Rate Last Admin   0.9 %  sodium chloride infusion  500 mL Intravenous Once  Gatha Mayer, MD        Review of Systems;  Patient denies headache, fevers, malaise, unintentional weight loss, skin rash, eye pain, sinus congestion and sinus pain, sore throat, dysphagia,  hemoptysis , cough, dyspnea, wheezing, chest pain, palpitations, orthopnea, edema, abdominal pain, nausea, melena, diarrhea, constipation, flank pain, dysuria, hematuria, urinary  Frequency, nocturia, numbness, tingling, seizures,  Focal weakness, Loss of consciousness,  Tremor, insomnia, depression, anxiety, and suicidal ideation.      Objective:  BP 124/70 (BP Location: Left Arm, Patient Position: Sitting, Cuff Size: Normal)   Pulse 85   Temp 98 F (36.7 C) (Oral)   Ht _0  (1.778 m)   Wt 297 lb 6.4 oz (134.9 kg)   SpO2 95%   BMI 42.67 kg/m   BP Readings from Last 3 Encounters:   07/02/21 124/70  12/28/20 134/70  09/27/20 120/80    Wt Readings from Last 3 Encounters:  07/02/21 297 lb 6.4 oz (134.9 kg)  12/28/20 299 lb 9.6 oz (135.9 kg)  09/27/20 298 lb (135.2 kg)    General appearance: alert, cooperative and appears stated age Ears: normal TM's and external ear canals both ears Throat: lips, mucosa, and tongue normal; teeth and gums normal Neck: no adenopathy, no carotid bruit, supple, symmetrical, trachea midline and thyroid not enlarged, symmetric, no tenderness/mass/nodules Back: symmetric, no curvature. ROM normal. No CVA tenderness. Lungs: clear to auscultation bilaterally Heart: regular rate and rhythm, S1, S2 normal, no murmur, click, rub or gallop Abdomen: soft, non-tender; bowel sounds normal; no masses,  no organomegaly Pulses: 2+ and symmetric Skin: Skin color, texture, turgor normal. No rashes or lesions Lymph nodes: Cervical, supraclavicular, and axillary nodes normal.  Lab Results  Component Value Date   HGBA1C 7.1 (H) 06/21/2021   HGBA1C 7.1 (H) 12/24/2020   HGBA1C 7.8 (H) 09/21/2020    Lab Results  Component Value Date   CREATININE 1.03 06/21/2021   CREATININE 1.07 12/24/2020   CREATININE 1.18 09/21/2020    Lab Results  Component Value Date   WBC 7.2 12/24/2020   HGB 13.9 12/24/2020   HCT 42.2 12/24/2020   PLT 290.0 12/24/2020   GLUCOSE 119 (H) 06/21/2021   CHOL 111 06/21/2021   TRIG 148.0 06/21/2021   HDL 26.30 (L) 06/21/2021   LDLDIRECT 64.0 12/24/2020   LDLCALC 56 06/21/2021   ALT 49 06/21/2021   AST 27 06/21/2021   NA 139 06/21/2021   K 4.3 06/21/2021   CL 104 06/21/2021   CREATININE 1.03 06/21/2021   BUN 18 06/21/2021   CO2 26 06/21/2021   TSH 1.62 10/23/2017   PSA 0.41 12/24/2020   HGBA1C 7.1 (H) 06/21/2021   MICROALBUR 0.2 06/15/2020    DG C-Arm 1-60 Min-No Report  Result Date: 08/19/2017 Fluoroscopy was utilized by the requesting physician.  No radiographic interpretation.    Assessment & Plan:    Problem List Items Addressed This Visit     Obesity, diabetes, and hypertension syndrome ([place]) - Primary   Relevant Medications   empagliflozin (JARDIANCE) 25 MG TABS tablet   metFORMIN (GLUCOPHAGE) 500 MG tablet   rosuvastatin (CRESTOR) 10 MG tablet   Semaglutide, 2 MG/DOSE, (OZEMPIC, 2 MG/DOSE,) 8 MG/3ML SOPN   telmisartan (MICARDIS) 80 MG tablet   Other Relevant Orders   Comprehensive metabolic panel   Hemoglobin A1c   Microalbumin / creatinine urine ratio   Hyperlipidemia LDL goal <100   Relevant Medications   rosuvastatin (CRESTOR) 10 MG tablet   telmisartan (MICARDIS) 80 MG tablet  Other Relevant Orders   Lipid panel   LDL cholesterol, direct   Follow-up: No follow-ups on file.   Crecencio Mc, MD

## 2021-07-03 ENCOUNTER — Other Ambulatory Visit (HOSPITAL_COMMUNITY): Payer: Self-pay

## 2021-07-09 ENCOUNTER — Other Ambulatory Visit (HOSPITAL_COMMUNITY): Payer: Self-pay

## 2021-07-11 ENCOUNTER — Other Ambulatory Visit (HOSPITAL_COMMUNITY): Payer: Self-pay

## 2021-07-15 ENCOUNTER — Other Ambulatory Visit (HOSPITAL_COMMUNITY): Payer: Self-pay

## 2021-07-23 ENCOUNTER — Other Ambulatory Visit (HOSPITAL_COMMUNITY): Payer: Self-pay

## 2021-07-24 ENCOUNTER — Other Ambulatory Visit (HOSPITAL_COMMUNITY): Payer: Self-pay

## 2021-08-14 ENCOUNTER — Other Ambulatory Visit (HOSPITAL_COMMUNITY): Payer: Self-pay

## 2021-08-19 ENCOUNTER — Other Ambulatory Visit (HOSPITAL_COMMUNITY): Payer: Self-pay

## 2021-09-09 ENCOUNTER — Other Ambulatory Visit (HOSPITAL_COMMUNITY): Payer: Self-pay

## 2021-09-16 ENCOUNTER — Other Ambulatory Visit (HOSPITAL_COMMUNITY): Payer: Self-pay

## 2021-09-17 ENCOUNTER — Other Ambulatory Visit (HOSPITAL_COMMUNITY): Payer: Self-pay

## 2021-09-23 ENCOUNTER — Other Ambulatory Visit: Payer: Self-pay | Admitting: Internal Medicine

## 2021-09-24 ENCOUNTER — Other Ambulatory Visit (HOSPITAL_COMMUNITY): Payer: Self-pay

## 2021-09-24 MED ORDER — CELECOXIB 200 MG PO CAPS
ORAL_CAPSULE | Freq: Two times a day (BID) | ORAL | 1 refills | Status: DC
  Filled 2021-09-24: qty 180, 90d supply, fill #0
  Filled 2021-12-16: qty 180, 90d supply, fill #1

## 2021-10-04 ENCOUNTER — Other Ambulatory Visit (HOSPITAL_COMMUNITY): Payer: Self-pay

## 2021-10-05 ENCOUNTER — Other Ambulatory Visit (HOSPITAL_COMMUNITY): Payer: Self-pay

## 2021-10-12 ENCOUNTER — Other Ambulatory Visit (HOSPITAL_COMMUNITY): Payer: Self-pay

## 2021-10-15 ENCOUNTER — Other Ambulatory Visit (HOSPITAL_COMMUNITY): Payer: Self-pay

## 2021-10-27 ENCOUNTER — Other Ambulatory Visit: Payer: Self-pay | Admitting: Internal Medicine

## 2021-10-28 ENCOUNTER — Other Ambulatory Visit (HOSPITAL_COMMUNITY): Payer: Self-pay

## 2021-10-28 MED ORDER — GLIMEPIRIDE 1 MG PO TABS
1.0000 mg | ORAL_TABLET | Freq: Two times a day (BID) | ORAL | 1 refills | Status: DC
  Filled 2021-10-28: qty 180, 90d supply, fill #0
  Filled 2022-01-19: qty 180, 90d supply, fill #1

## 2021-10-30 ENCOUNTER — Other Ambulatory Visit (HOSPITAL_COMMUNITY): Payer: Self-pay

## 2021-11-09 ENCOUNTER — Other Ambulatory Visit: Payer: Self-pay | Admitting: Internal Medicine

## 2021-11-11 ENCOUNTER — Other Ambulatory Visit (HOSPITAL_COMMUNITY): Payer: Self-pay

## 2021-11-11 MED ORDER — OZEMPIC (2 MG/DOSE) 8 MG/3ML ~~LOC~~ SOPN
2.0000 mg | PEN_INJECTOR | SUBCUTANEOUS | 1 refills | Status: DC
  Filled 2021-11-11: qty 3, 28d supply, fill #0
  Filled 2021-12-04: qty 3, 28d supply, fill #1

## 2021-11-12 ENCOUNTER — Other Ambulatory Visit (HOSPITAL_COMMUNITY): Payer: Self-pay

## 2021-11-13 ENCOUNTER — Other Ambulatory Visit (HOSPITAL_COMMUNITY): Payer: Self-pay

## 2021-11-15 ENCOUNTER — Other Ambulatory Visit (HOSPITAL_COMMUNITY): Payer: Self-pay

## 2021-11-16 ENCOUNTER — Other Ambulatory Visit (HOSPITAL_COMMUNITY): Payer: Self-pay

## 2021-11-19 ENCOUNTER — Other Ambulatory Visit (HOSPITAL_COMMUNITY): Payer: Self-pay

## 2021-11-25 ENCOUNTER — Other Ambulatory Visit: Payer: Self-pay

## 2021-11-25 DIAGNOSIS — H10413 Chronic giant papillary conjunctivitis, bilateral: Secondary | ICD-10-CM | POA: Diagnosis not present

## 2021-11-25 DIAGNOSIS — Z5321 Procedure and treatment not carried out due to patient leaving prior to being seen by health care provider: Secondary | ICD-10-CM | POA: Diagnosis not present

## 2021-11-25 DIAGNOSIS — H40013 Open angle with borderline findings, low risk, bilateral: Secondary | ICD-10-CM | POA: Diagnosis not present

## 2021-11-25 DIAGNOSIS — E119 Type 2 diabetes mellitus without complications: Secondary | ICD-10-CM | POA: Diagnosis not present

## 2021-11-25 DIAGNOSIS — H5710 Ocular pain, unspecified eye: Secondary | ICD-10-CM | POA: Diagnosis not present

## 2021-11-25 NOTE — ED Triage Notes (Signed)
Pt presents via POV c/o eye pain, watery eyes, and eye itching after eye exam today. Reports redness of eyes since. Reports unable to see eye dr again today.

## 2021-11-26 ENCOUNTER — Emergency Department
Admission: EM | Admit: 2021-11-26 | Discharge: 2021-11-26 | Discharge status: Against Medical Advice | Payer: Medicare Other | Attending: Emergency Medicine | Admitting: Emergency Medicine

## 2021-12-01 DIAGNOSIS — G4733 Obstructive sleep apnea (adult) (pediatric): Secondary | ICD-10-CM | POA: Diagnosis not present

## 2021-12-02 ENCOUNTER — Other Ambulatory Visit (HOSPITAL_COMMUNITY): Payer: Self-pay

## 2021-12-02 ENCOUNTER — Other Ambulatory Visit: Payer: Self-pay | Admitting: Internal Medicine

## 2021-12-02 MED ORDER — AMLODIPINE BESYLATE 10 MG PO TABS
ORAL_TABLET | Freq: Every day | ORAL | 1 refills | Status: DC
  Filled 2021-12-02: qty 90, 90d supply, fill #0
  Filled 2022-03-04: qty 90, 90d supply, fill #1

## 2021-12-03 ENCOUNTER — Other Ambulatory Visit (HOSPITAL_COMMUNITY): Payer: Self-pay

## 2021-12-04 ENCOUNTER — Other Ambulatory Visit (HOSPITAL_COMMUNITY): Payer: Self-pay

## 2021-12-09 ENCOUNTER — Other Ambulatory Visit: Payer: Self-pay | Admitting: Internal Medicine

## 2021-12-09 ENCOUNTER — Other Ambulatory Visit (HOSPITAL_COMMUNITY): Payer: Self-pay

## 2021-12-09 DIAGNOSIS — H524 Presbyopia: Secondary | ICD-10-CM | POA: Diagnosis not present

## 2021-12-09 NOTE — Telephone Encounter (Signed)
Refilled: 06/11/2021 Last OV: 07/02/2021 Next OV: 12/30/2021

## 2021-12-10 ENCOUNTER — Other Ambulatory Visit (HOSPITAL_COMMUNITY): Payer: Self-pay

## 2021-12-10 MED ORDER — ESZOPICLONE 2 MG PO TABS
2.0000 mg | ORAL_TABLET | Freq: Every evening | ORAL | 5 refills | Status: DC | PRN
  Filled 2021-12-10: qty 30, 30d supply, fill #0
  Filled 2022-01-07: qty 30, 30d supply, fill #1
  Filled 2022-02-07: qty 30, 30d supply, fill #2
  Filled 2022-03-04 – 2022-03-06 (×2): qty 30, 30d supply, fill #3
  Filled 2022-04-10: qty 30, 30d supply, fill #4
  Filled 2022-05-14: qty 30, 30d supply, fill #5

## 2021-12-11 ENCOUNTER — Other Ambulatory Visit (HOSPITAL_COMMUNITY): Payer: Self-pay

## 2021-12-16 ENCOUNTER — Other Ambulatory Visit (HOSPITAL_COMMUNITY): Payer: Self-pay

## 2021-12-17 ENCOUNTER — Other Ambulatory Visit (HOSPITAL_COMMUNITY): Payer: Self-pay

## 2021-12-25 NOTE — Telephone Encounter (Signed)
MyChart messgae sent to patient. 

## 2021-12-26 ENCOUNTER — Other Ambulatory Visit (INDEPENDENT_AMBULATORY_CARE_PROVIDER_SITE_OTHER): Payer: Medicare Other

## 2021-12-26 DIAGNOSIS — I152 Hypertension secondary to endocrine disorders: Secondary | ICD-10-CM

## 2021-12-26 DIAGNOSIS — E1169 Type 2 diabetes mellitus with other specified complication: Secondary | ICD-10-CM

## 2021-12-26 DIAGNOSIS — E785 Hyperlipidemia, unspecified: Secondary | ICD-10-CM | POA: Diagnosis not present

## 2021-12-26 DIAGNOSIS — E669 Obesity, unspecified: Secondary | ICD-10-CM | POA: Diagnosis not present

## 2021-12-26 DIAGNOSIS — E1159 Type 2 diabetes mellitus with other circulatory complications: Secondary | ICD-10-CM | POA: Diagnosis not present

## 2021-12-26 LAB — COMPREHENSIVE METABOLIC PANEL
ALT: 42 U/L (ref 0–53)
AST: 25 U/L (ref 0–37)
Albumin: 3.9 g/dL (ref 3.5–5.2)
Alkaline Phosphatase: 54 U/L (ref 39–117)
BUN: 19 mg/dL (ref 6–23)
CO2: 30 mEq/L (ref 19–32)
Calcium: 9.7 mg/dL (ref 8.4–10.5)
Chloride: 104 mEq/L (ref 96–112)
Creatinine, Ser: 1.09 mg/dL (ref 0.40–1.50)
GFR: 71.09 mL/min (ref >60.00)
Glucose, Bld: 115 mg/dL — ABNORMAL HIGH (ref 70–99)
Potassium: 4.2 mEq/L (ref 3.5–5.1)
Sodium: 140 mEq/L (ref 135–145)
Total Bilirubin: 0.5 mg/dL (ref 0.2–1.2)
Total Protein: 6.5 g/dL (ref 6.0–8.3)

## 2021-12-26 LAB — LIPID PANEL
Cholesterol: 105 mg/dL (ref 0–200)
HDL: 25.6 mg/dL — ABNORMAL LOW (ref >39.00)
LDL Cholesterol: 55 mg/dL (ref 0–99)
NonHDL: 78.9
Total CHOL/HDL Ratio: 4
Triglycerides: 120 mg/dL (ref 0.0–149.0)
VLDL: 24 mg/dL (ref 0.0–40.0)

## 2021-12-26 LAB — MICROALBUMIN / CREATININE URINE RATIO
Creatinine,U: 73 mg/dL
Microalb Creat Ratio: 2.4 mg/g (ref 0.0–30.0)
Microalb, Ur: 1.7 mg/dL (ref 0.0–1.9)

## 2021-12-26 LAB — LDL CHOLESTEROL, DIRECT: Direct LDL: 65 mg/dL

## 2021-12-26 LAB — HEMOGLOBIN A1C: Hgb A1c MFr Bld: 7.6 % — ABNORMAL HIGH (ref 4.6–6.5)

## 2021-12-30 ENCOUNTER — Other Ambulatory Visit: Payer: Self-pay

## 2021-12-30 ENCOUNTER — Telehealth (INDEPENDENT_AMBULATORY_CARE_PROVIDER_SITE_OTHER): Payer: Medicare Other | Admitting: Internal Medicine

## 2021-12-30 ENCOUNTER — Other Ambulatory Visit (HOSPITAL_COMMUNITY): Payer: Self-pay

## 2021-12-30 ENCOUNTER — Encounter: Payer: Self-pay | Admitting: Internal Medicine

## 2021-12-30 VITALS — BP 132/82 | HR 90 | Ht 70.0 in | Wt 300.2 lb

## 2021-12-30 DIAGNOSIS — I10 Essential (primary) hypertension: Secondary | ICD-10-CM

## 2021-12-30 DIAGNOSIS — E1159 Type 2 diabetes mellitus with other circulatory complications: Secondary | ICD-10-CM

## 2021-12-30 DIAGNOSIS — E1169 Type 2 diabetes mellitus with other specified complication: Secondary | ICD-10-CM

## 2021-12-30 DIAGNOSIS — E669 Obesity, unspecified: Secondary | ICD-10-CM | POA: Diagnosis not present

## 2021-12-30 DIAGNOSIS — I152 Hypertension secondary to endocrine disorders: Secondary | ICD-10-CM

## 2021-12-30 MED ORDER — CELECOXIB 200 MG PO CAPS
200.0000 mg | ORAL_CAPSULE | Freq: Two times a day (BID) | ORAL | 1 refills | Status: DC
  Filled 2021-12-30 – 2022-07-19 (×2): qty 180, 90d supply, fill #0
  Filled 2022-10-14: qty 180, 90d supply, fill #1

## 2021-12-30 MED ORDER — ROSUVASTATIN CALCIUM 10 MG PO TABS
10.0000 mg | ORAL_TABLET | ORAL | 1 refills | Status: DC
  Filled 2021-12-30 – 2022-03-28 (×2): qty 45, 90d supply, fill #0
  Filled 2022-07-21: qty 45, 90d supply, fill #1

## 2021-12-30 MED ORDER — METFORMIN HCL 500 MG PO TABS
ORAL_TABLET | ORAL | 3 refills | Status: DC
  Filled 2021-12-30: qty 450, 90d supply, fill #0
  Filled 2022-05-14: qty 450, 90d supply, fill #1
  Filled 2022-09-07: qty 450, 90d supply, fill #2
  Filled 2022-12-03: qty 450, 90d supply, fill #3

## 2021-12-30 MED ORDER — OZEMPIC (2 MG/DOSE) 8 MG/3ML ~~LOC~~ SOPN
2.0000 mg | PEN_INJECTOR | SUBCUTANEOUS | 1 refills | Status: DC
  Filled 2021-12-30: qty 3, 28d supply, fill #0
  Filled 2022-01-14: qty 3, 28d supply, fill #1
  Filled 2022-02-20: qty 3, 28d supply, fill #2
  Filled 2022-03-20: qty 3, 28d supply, fill #3
  Filled 2022-04-12 – 2022-04-18 (×3): qty 3, 28d supply, fill #4
  Filled 2022-05-17: qty 3, 28d supply, fill #5

## 2021-12-30 MED ORDER — TELMISARTAN 80 MG PO TABS
80.0000 mg | ORAL_TABLET | Freq: Every day | ORAL | 1 refills | Status: DC
  Filled 2021-12-30: qty 90, 90d supply, fill #0
  Filled 2022-03-28: qty 90, 90d supply, fill #1

## 2021-12-30 NOTE — Assessment & Plan Note (Signed)
Advised to suspend celebrex AS A trial

## 2021-12-30 NOTE — Progress Notes (Signed)
Virtual Visit via [place]   Note   This format is felt to be most appropriate for this patient at this time.  All issues noted in this document were discussed and addressed.  No physical exam was performed (except for noted visual exam findings with Video Visits).   I connected with [name]  on 12/30/21 at  3:00 PM EST by a video enabled telemedicine application or telephone and verified that I am speaking with the correct person using two identifiers. Location patient: home Location provider: work or home office Persons participating in the virtual visit: patient, provider  I discussed the limitations, risks, security and privacy concerns of performing an evaluation and management service by telephone and the availability of in person appointments. I also discussed with the patient that there may be a patient responsible charge related to this service. The patient expressed understanding and agreed to proceed.  Reason for visit: follow up on type 2 DM  HPI:   1) T2DM: recent labs reviewed  taking ozempic 2 mg , amaryl 1 mg bid, jardiance 25 and metformin 2500 mg in divided doses  A1c has risen to 7.6  and he has gaind weight due to stress eating.  His wife's continued health problems have been a source of anxiety.   2) OSA:  Diagnosed by sleep study. he is wearing his CPAP every night a minimum of 6 hours per night and notes improved daytime wakefulness and decreased fatigue    3) HTN:  home readings reviewed. ,  over 50% have been > 130/80.  He is taking celebrex daily along with amlodipine and telmisartan   ROS: See pertinent positives and negatives per HPI.  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   Diabetes mellitus, type 2 ([place])    Diverticulosis    At Age 80 on colonoscopy   GERD (gastroesophageal reflux disease)    Gout    Hyperlipemia    Hypertension    Insomnia 04/27/2010   Nephrolithiasis    Obstructive sleep apnea 04/27/2010    OSA on CPAP    Reactive airway disease    Right ureteral stone 10/25/2017   Sleep apnea    wears cpap    Tinea cruris     Past Surgical History:  Procedure Laterality Date   COLONOSCOPY     last 11 yrs    cystoscopy  09/2008   & stone removal   CYSTOSCOPY/URETEROSCOPY/HOLMIUM LASER/STENT PLACEMENT Right 08/19/2017   Procedure: CYSTOSCOPY/RIGHT RETROGRADE RIGHT URETEROSCOPY/ RIGHT URETERAL STENT PLACEMENT;  Surgeon: Raynelle Bring, MD;  Location: [facility];  Service: Urology;  Laterality: Right;   SURGERY SCROTAL / TESTICULAR N/A 1966   for undescended testicle    Family History  Problem Relation Age of Onset   Heart disease Mother    Deep vein thrombosis Father    Colon cancer Neg Hx    Colon polyps Neg Hx    Esophageal cancer Neg Hx    Rectal cancer Neg Hx    Stomach cancer Neg Hx     SOCIAL HX:  reports that he has never smoked. He has never used smokeless tobacco. He reports that he does not drink alcohol and does not use drugs.    Current Outpatient Medications:    allopurinol (ZYLOPRIM) 300 MG tablet, TAKE 1 TABLET BY MOUTH DAILY., Disp: 90 tablet, Rfl: 3   AMBULATORY NON FORMULARY MEDICATION, Place 30 g rectally 2 (two) times daily. Medication Name: Diltiazem 2% /  lidocaine 5%  Cream 50/50 mix  Insert into anal canal twice daily, Disp: 30 g, Rfl: 3   amLODipine (NORVASC) 10 MG tablet, TAKE 1 TABLET BY MOUTH ONCE DAILY, Disp: 90 tablet, Rfl: 1   aspirin 81 MG tablet, Take 81 mg by mouth every other day., Disp: , Rfl:    blood glucose meter kit and supplies, Use up to four times daily as directed, Disp: 1 each, Rfl: 0   empagliflozin (JARDIANCE) 25 MG TABS tablet, TAKE 1 TABLET BY MOUTH DAILY., Disp: 90 tablet, Rfl: 1   ergocalciferol (DRISDOL) 1.25 MG (50000 UT) capsule, Take 1 capsule by mouth once a week., Disp: 12 capsule, Rfl: 3   eszopiclone (LUNESTA) 2 MG TABS tablet, Take 1 tablet (2 mg total) by mouth immediately before bedtime as needed for sleep., Disp: 30 tablet,  Rfl: 5   glimepiride (AMARYL) 1 MG tablet, Take 1 tablet (1 mg total) by mouth 2 (two) times daily., Disp: 180 tablet, Rfl: 1   glucose blood test strip, Use up to four times daily as directed, Disp: 100 each, Rfl: 0   OneTouch Delica Lancets 69S MISC, Use up to four times daily as directed, Disp: 100 each, Rfl: 0   pantoprazole (PROTONIX) 40 MG tablet, TAKE 1 TABLET BY MOUTH DAILY., Disp: 90 tablet, Rfl: 3   terazosin (HYTRIN) 5 MG capsule, TAKE 1 CAPSULE BY MOUTH AT BEDTIME, Disp: 90 capsule, Rfl: 3   celecoxib (CELEBREX) 200 MG capsule, Take 1 capsule (200 mg total) by mouth 2 (two) times daily., Disp: 180 capsule, Rfl: 1   metFORMIN (GLUCOPHAGE) 500 MG tablet, Take 5 tablets (2,500 mg total) by mouth daily. Take two in the morning, one at lunch and two in the evening, Disp: 450 tablet, Rfl: 3   oseltamivir (TAMIFLU) 75 MG capsule, Take 1 capsule by mouth 2 times daily. (Patient not taking: Reported on 12/30/2021), Disp: 10 capsule, Rfl: 0   rosuvastatin (CRESTOR) 10 MG tablet, Take 1 tablet (10 mg total) by mouth every other day., Disp: 45 tablet, Rfl: 1   Semaglutide, 2 MG/DOSE, (OZEMPIC, 2 MG/DOSE,) 8 MG/3ML SOPN, Inject 2 mg as directed once a week., Disp: 9 mL, Rfl: 1   telmisartan (MICARDIS) 80 MG tablet, Take 1 tablet (80 mg total) by mouth daily., Disp: 90 tablet, Rfl: 1  Current Facility-Administered Medications:    0.9 %  sodium chloride infusion, 500 mL, Intravenous, Once, Carlean Purl Ofilia Neas, MD  EXAM:  VITALS per patient if applicable:  GENERAL: alert, oriented, appears well and in no acute distress  HEENT: atraumatic, conjunttiva clear, no obvious abnormalities on inspection of external nose and ears  NECK: normal movements of the head and neck  LUNGS: on inspection no signs of respiratory distress, breathing rate appears normal, no obvious gross SOB, gasping or wheezing  CV: no obvious cyanosis  MS: moves all visible extremities without noticeable  abnormality  PSYCH/NEURO: pleasant and cooperative, no obvious depression or anxiety, speech and thought processing grossly intact  ASSESSMENT AND PLAN: Essential hypertension Assessment & Plan: Advised to suspend celebrex AS A trial    Obesity, diabetes, and hypertension syndrome (HCC) Assessment & Plan: Loss of  control despite increased dose of  glimepiride to 2 mg daily and increased dose of  Ozempic due to patient's stress eating.  continue statin and ARB  Encouraged to start walking .   Lab Results  Component Value Date   HGBA1C 7.6 (H) 12/26/2021   Lab Results  Component Value Date  CREATININE 1.09 12/26/2021   Lab Results  Component Value Date   MICROALBUR 1.7 12/26/2021   MICROALBUR 0.2 06/15/2020        Other orders -     Celecoxib; Take 1 capsule (200 mg total) by mouth 2 (two) times daily.  Dispense: 180 capsule; Refill: 1 -     metFORMIN HCl; Take 5 tablets (2,500 mg total) by mouth daily. Take two in the morning, one at lunch and two in the evening  Dispense: 450 tablet; Refill: 3 -     Rosuvastatin Calcium; Take 1 tablet (10 mg total) by mouth every other day.  Dispense: 45 tablet; Refill: 1 -     Ozempic (2 MG/DOSE); Inject 2 mg as directed once a week.  Dispense: 9 mL; Refill: 1 -     Telmisartan; Take 1 tablet (80 mg total) by mouth daily.  Dispense: 90 tablet; Refill: 1      I discussed the assessment and treatment plan with the patient. The patient was provided an opportunity to ask questions and all were answered. The patient agreed with the plan and demonstrated an understanding of the instructions.   The patient was advised to call back or seek an in-person evaluation if the symptoms worsen or if the condition fails to improve as anticipated.   I spent 30 minutes dedicated to the care of this patient on the date of this encounter to include pre-visit review of his medical history,  Face-to-face time with the patient , and post visit ordering of  testing and therapeutics.    Crecencio Mc, MD

## 2021-12-30 NOTE — Assessment & Plan Note (Signed)
Loss of  control despite increased dose of  glimepiride to 2 mg daily and increased dose of  Ozempic due to patient's stress eating.  continue statin and ARB  Encouraged to start walking .   Lab Results  Component Value Date   HGBA1C 7.6 (H) 12/26/2021   Lab Results  Component Value Date   CREATININE 1.09 12/26/2021   Lab Results  Component Value Date   MICROALBUR 1.7 12/26/2021   MICROALBUR 0.2 06/15/2020

## 2021-12-31 ENCOUNTER — Other Ambulatory Visit: Payer: Self-pay

## 2021-12-31 ENCOUNTER — Other Ambulatory Visit (HOSPITAL_COMMUNITY): Payer: Self-pay

## 2021-12-31 DIAGNOSIS — G4733 Obstructive sleep apnea (adult) (pediatric): Secondary | ICD-10-CM | POA: Diagnosis not present

## 2022-01-01 ENCOUNTER — Ambulatory Visit: Payer: Medicare Other | Admitting: Internal Medicine

## 2022-01-07 ENCOUNTER — Other Ambulatory Visit (HOSPITAL_COMMUNITY): Payer: Self-pay

## 2022-01-07 ENCOUNTER — Other Ambulatory Visit: Payer: Self-pay

## 2022-01-14 ENCOUNTER — Encounter: Payer: Self-pay | Admitting: Internal Medicine

## 2022-01-14 ENCOUNTER — Other Ambulatory Visit (HOSPITAL_COMMUNITY): Payer: Self-pay

## 2022-01-17 ENCOUNTER — Other Ambulatory Visit (HOSPITAL_COMMUNITY): Payer: Self-pay

## 2022-01-17 NOTE — Telephone Encounter (Signed)
Per test claim Ozempic is covered, currently too soon to refill due to last fill date.

## 2022-01-20 ENCOUNTER — Other Ambulatory Visit (HOSPITAL_COMMUNITY): Payer: Self-pay

## 2022-01-20 ENCOUNTER — Other Ambulatory Visit: Payer: Self-pay

## 2022-02-07 ENCOUNTER — Other Ambulatory Visit (HOSPITAL_COMMUNITY): Payer: Self-pay

## 2022-02-07 ENCOUNTER — Other Ambulatory Visit: Payer: Self-pay | Admitting: Internal Medicine

## 2022-02-07 ENCOUNTER — Other Ambulatory Visit: Payer: Self-pay

## 2022-02-07 MED ORDER — ONETOUCH DELICA LANCETS 33G MISC
1.0000 | Freq: Four times a day (QID) | 0 refills | Status: DC
  Filled 2022-02-07: qty 100, 25d supply, fill #0

## 2022-02-07 MED ORDER — ONETOUCH ULTRA VI STRP
1.0000 | ORAL_STRIP | Freq: Four times a day (QID) | 0 refills | Status: DC
  Filled 2022-02-07: qty 100, 25d supply, fill #0

## 2022-02-20 ENCOUNTER — Other Ambulatory Visit (HOSPITAL_COMMUNITY): Payer: Self-pay

## 2022-02-20 ENCOUNTER — Other Ambulatory Visit: Payer: Self-pay

## 2022-03-04 ENCOUNTER — Other Ambulatory Visit (HOSPITAL_COMMUNITY): Payer: Self-pay

## 2022-03-04 ENCOUNTER — Other Ambulatory Visit: Payer: Self-pay

## 2022-03-05 ENCOUNTER — Other Ambulatory Visit: Payer: Self-pay

## 2022-03-06 ENCOUNTER — Other Ambulatory Visit: Payer: Self-pay

## 2022-03-06 ENCOUNTER — Other Ambulatory Visit (HOSPITAL_COMMUNITY): Payer: Self-pay

## 2022-03-17 ENCOUNTER — Telehealth: Payer: Self-pay | Admitting: Internal Medicine

## 2022-03-17 NOTE — Telephone Encounter (Signed)
Called patient to schedule Medicare Annual Wellness Visit (AWV). Left message for patient to call back and schedule Medicare Annual Wellness Visit (AWV).  Last date of AWV:  AWVI eligible as of 03/14/2022   Please schedule an appointment at any time with Denisa, [place].  If any questions, please contact me at [phone].    Thank you,  [place] Direct dial  [phone]

## 2022-03-20 ENCOUNTER — Other Ambulatory Visit: Payer: Self-pay

## 2022-03-28 ENCOUNTER — Other Ambulatory Visit: Payer: Self-pay

## 2022-03-28 ENCOUNTER — Other Ambulatory Visit: Payer: Self-pay | Admitting: Internal Medicine

## 2022-03-28 ENCOUNTER — Other Ambulatory Visit (HOSPITAL_COMMUNITY): Payer: Self-pay

## 2022-03-28 MED ORDER — TERAZOSIN HCL 5 MG PO CAPS
ORAL_CAPSULE | Freq: Every day | ORAL | 3 refills | Status: DC
  Filled 2022-03-28: qty 90, 90d supply, fill #0
  Filled 2022-07-15: qty 90, 90d supply, fill #1
  Filled 2022-10-04: qty 90, 90d supply, fill #2
  Filled 2023-01-05: qty 90, 90d supply, fill #3

## 2022-03-29 ENCOUNTER — Other Ambulatory Visit (HOSPITAL_COMMUNITY): Payer: Self-pay

## 2022-03-31 ENCOUNTER — Other Ambulatory Visit: Payer: Self-pay

## 2022-04-10 ENCOUNTER — Other Ambulatory Visit (HOSPITAL_COMMUNITY): Payer: Self-pay

## 2022-04-12 ENCOUNTER — Other Ambulatory Visit (HOSPITAL_COMMUNITY): Payer: Self-pay

## 2022-04-14 ENCOUNTER — Other Ambulatory Visit: Payer: Self-pay

## 2022-04-14 ENCOUNTER — Other Ambulatory Visit (HOSPITAL_COMMUNITY): Payer: Self-pay

## 2022-04-14 ENCOUNTER — Encounter (HOSPITAL_COMMUNITY): Payer: Self-pay

## 2022-04-18 ENCOUNTER — Other Ambulatory Visit: Payer: Self-pay

## 2022-04-18 ENCOUNTER — Other Ambulatory Visit (HOSPITAL_COMMUNITY): Payer: Self-pay

## 2022-04-21 ENCOUNTER — Other Ambulatory Visit: Payer: Self-pay | Admitting: Internal Medicine

## 2022-04-21 ENCOUNTER — Other Ambulatory Visit (HOSPITAL_COMMUNITY): Payer: Self-pay

## 2022-04-21 MED ORDER — GLIMEPIRIDE 1 MG PO TABS
1.0000 mg | ORAL_TABLET | Freq: Two times a day (BID) | ORAL | 1 refills | Status: DC
  Filled 2022-04-21: qty 180, 90d supply, fill #0
  Filled 2022-07-19: qty 180, 90d supply, fill #1

## 2022-05-14 ENCOUNTER — Other Ambulatory Visit (HOSPITAL_COMMUNITY): Payer: Self-pay

## 2022-05-14 ENCOUNTER — Other Ambulatory Visit: Payer: Self-pay

## 2022-05-19 ENCOUNTER — Encounter: Payer: Self-pay | Admitting: Internal Medicine

## 2022-05-19 ENCOUNTER — Other Ambulatory Visit (HOSPITAL_COMMUNITY): Payer: Self-pay

## 2022-05-19 ENCOUNTER — Other Ambulatory Visit: Payer: Self-pay

## 2022-05-19 DIAGNOSIS — E785 Hyperlipidemia, unspecified: Secondary | ICD-10-CM

## 2022-05-19 DIAGNOSIS — Z125 Encounter for screening for malignant neoplasm of prostate: Secondary | ICD-10-CM

## 2022-05-19 DIAGNOSIS — Z79899 Other long term (current) drug therapy: Secondary | ICD-10-CM

## 2022-05-19 DIAGNOSIS — E669 Obesity, unspecified: Secondary | ICD-10-CM

## 2022-05-20 NOTE — Telephone Encounter (Signed)
Patient had full lab panels done 12/26/21. Please advise on labs needed for upcoming OV, scheduled as follow up on A1C. Thanks!

## 2022-05-20 NOTE — Telephone Encounter (Signed)
Pt called in asking for an orders of lab to put in before his next visit appt on 7/2?

## 2022-05-27 ENCOUNTER — Other Ambulatory Visit: Payer: Self-pay

## 2022-05-29 ENCOUNTER — Other Ambulatory Visit: Payer: Self-pay

## 2022-05-30 ENCOUNTER — Other Ambulatory Visit: Payer: Self-pay

## 2022-05-30 ENCOUNTER — Other Ambulatory Visit (HOSPITAL_COMMUNITY): Payer: Self-pay

## 2022-05-31 ENCOUNTER — Other Ambulatory Visit: Payer: Self-pay | Admitting: Internal Medicine

## 2022-06-02 ENCOUNTER — Other Ambulatory Visit (HOSPITAL_COMMUNITY): Payer: Self-pay

## 2022-06-02 MED ORDER — AMLODIPINE BESYLATE 10 MG PO TABS
10.0000 mg | ORAL_TABLET | Freq: Every day | ORAL | 1 refills | Status: DC
  Filled 2022-06-02: qty 90, 90d supply, fill #0
  Filled 2022-08-26: qty 90, 90d supply, fill #1

## 2022-06-12 ENCOUNTER — Other Ambulatory Visit: Payer: Self-pay | Admitting: Internal Medicine

## 2022-06-12 MED ORDER — ESZOPICLONE 2 MG PO TABS
2.0000 mg | ORAL_TABLET | Freq: Every evening | ORAL | 5 refills | Status: DC | PRN
  Filled 2022-06-12: qty 30, 30d supply, fill #0
  Filled 2022-07-13: qty 30, 30d supply, fill #1
  Filled 2022-08-14: qty 30, 30d supply, fill #2
  Filled 2022-09-11: qty 30, 30d supply, fill #3
  Filled 2022-10-04: qty 30, 30d supply, fill #4
  Filled 2022-11-07: qty 30, 30d supply, fill #5

## 2022-06-13 ENCOUNTER — Other Ambulatory Visit: Payer: Self-pay

## 2022-06-13 DIAGNOSIS — G4733 Obstructive sleep apnea (adult) (pediatric): Secondary | ICD-10-CM | POA: Diagnosis not present

## 2022-06-14 ENCOUNTER — Other Ambulatory Visit: Payer: Self-pay | Admitting: Internal Medicine

## 2022-06-16 ENCOUNTER — Other Ambulatory Visit: Payer: Self-pay

## 2022-06-16 ENCOUNTER — Other Ambulatory Visit (HOSPITAL_COMMUNITY): Payer: Self-pay

## 2022-06-16 MED ORDER — OZEMPIC (2 MG/DOSE) 8 MG/3ML ~~LOC~~ SOPN
2.0000 mg | PEN_INJECTOR | SUBCUTANEOUS | 1 refills | Status: DC
  Filled 2022-06-16: qty 3, 28d supply, fill #0
  Filled 2022-07-12: qty 3, 28d supply, fill #1
  Filled 2022-08-07: qty 3, 28d supply, fill #2
  Filled 2022-09-04: qty 3, 28d supply, fill #3

## 2022-06-17 ENCOUNTER — Other Ambulatory Visit: Payer: Self-pay

## 2022-06-20 ENCOUNTER — Other Ambulatory Visit (HOSPITAL_COMMUNITY): Payer: Self-pay

## 2022-06-20 ENCOUNTER — Other Ambulatory Visit: Payer: Self-pay

## 2022-06-20 ENCOUNTER — Other Ambulatory Visit: Payer: Self-pay | Admitting: Internal Medicine

## 2022-06-20 MED ORDER — TELMISARTAN 80 MG PO TABS
80.0000 mg | ORAL_TABLET | Freq: Every day | ORAL | 1 refills | Status: DC
  Filled 2022-06-20: qty 90, 90d supply, fill #0
  Filled 2022-09-22: qty 90, 90d supply, fill #1

## 2022-07-02 ENCOUNTER — Encounter: Payer: Self-pay | Admitting: Internal Medicine

## 2022-07-02 DIAGNOSIS — G4733 Obstructive sleep apnea (adult) (pediatric): Secondary | ICD-10-CM

## 2022-07-04 ENCOUNTER — Other Ambulatory Visit (INDEPENDENT_AMBULATORY_CARE_PROVIDER_SITE_OTHER): Payer: Medicare Other

## 2022-07-04 DIAGNOSIS — E669 Obesity, unspecified: Secondary | ICD-10-CM

## 2022-07-04 DIAGNOSIS — E785 Hyperlipidemia, unspecified: Secondary | ICD-10-CM

## 2022-07-04 DIAGNOSIS — E1169 Type 2 diabetes mellitus with other specified complication: Secondary | ICD-10-CM

## 2022-07-04 DIAGNOSIS — Z79899 Other long term (current) drug therapy: Secondary | ICD-10-CM

## 2022-07-04 DIAGNOSIS — E1159 Type 2 diabetes mellitus with other circulatory complications: Secondary | ICD-10-CM | POA: Diagnosis not present

## 2022-07-04 DIAGNOSIS — I152 Hypertension secondary to endocrine disorders: Secondary | ICD-10-CM

## 2022-07-04 DIAGNOSIS — Z125 Encounter for screening for malignant neoplasm of prostate: Secondary | ICD-10-CM | POA: Diagnosis not present

## 2022-07-04 LAB — CBC WITH DIFFERENTIAL/PLATELET
Basophils Absolute: 0 10*3/uL (ref 0.0–0.1)
Basophils Relative: 0.7 % (ref 0.0–3.0)
Eosinophils Absolute: 0.5 10*3/uL (ref 0.0–0.7)
Eosinophils Relative: 7.8 % — ABNORMAL HIGH (ref 0.0–5.0)
HCT: 43.3 % (ref 39.0–52.0)
Hemoglobin: 14 g/dL (ref 13.0–17.0)
Lymphocytes Relative: 29.8 % (ref 12.0–46.0)
Lymphs Abs: 2 10*3/uL (ref 0.7–4.0)
MCHC: 32.2 g/dL (ref 30.0–36.0)
MCV: 90.2 fl (ref 78.0–100.0)
Monocytes Absolute: 0.6 10*3/uL (ref 0.1–1.0)
Monocytes Relative: 8.2 % (ref 3.0–12.0)
Neutro Abs: 3.7 10*3/uL (ref 1.4–7.7)
Neutrophils Relative %: 53.5 % (ref 43.0–77.0)
Platelets: 316 10*3/uL (ref 150.0–400.0)
RBC: 4.8 Mil/uL (ref 4.22–5.81)
RDW: 16.5 % — ABNORMAL HIGH (ref 11.5–15.5)
WBC: 6.8 10*3/uL (ref 4.0–10.5)

## 2022-07-04 LAB — TSH: TSH: 3.12 u[IU]/mL (ref 0.35–5.50)

## 2022-07-04 LAB — LIPID PANEL
Cholesterol: 98 mg/dL (ref 0–200)
HDL: 24.8 mg/dL — ABNORMAL LOW (ref >39.00)
LDL Cholesterol: 50 mg/dL (ref 0–99)
NonHDL: 73.64
Total CHOL/HDL Ratio: 4
Triglycerides: 117 mg/dL (ref 0.0–149.0)
VLDL: 23.4 mg/dL (ref 0.0–40.0)

## 2022-07-04 LAB — LDL CHOLESTEROL, DIRECT: Direct LDL: 62 mg/dL

## 2022-07-04 LAB — COMPREHENSIVE METABOLIC PANEL
ALT: 31 U/L (ref 0–53)
AST: 19 U/L (ref 0–37)
Albumin: 3.9 g/dL (ref 3.5–5.2)
Alkaline Phosphatase: 58 U/L (ref 39–117)
BUN: 18 mg/dL (ref 6–23)
CO2: 26 mEq/L (ref 19–32)
Calcium: 9.6 mg/dL (ref 8.4–10.5)
Chloride: 106 mEq/L (ref 96–112)
Creatinine, Ser: 1.06 mg/dL (ref 0.40–1.50)
GFR: 73.25 mL/min (ref >60.00)
Glucose, Bld: 101 mg/dL — ABNORMAL HIGH (ref 70–99)
Potassium: 3.9 mEq/L (ref 3.5–5.1)
Sodium: 141 mEq/L (ref 135–145)
Total Bilirubin: 0.6 mg/dL (ref 0.2–1.2)
Total Protein: 6.8 g/dL (ref 6.0–8.3)

## 2022-07-04 LAB — HEMOGLOBIN A1C: Hgb A1c MFr Bld: 6.4 % (ref 4.6–6.5)

## 2022-07-04 LAB — PSA, MEDICARE: PSA: 0.31 ng/ml (ref 0.10–4.00)

## 2022-07-05 ENCOUNTER — Other Ambulatory Visit: Payer: Self-pay | Admitting: Internal Medicine

## 2022-07-07 ENCOUNTER — Other Ambulatory Visit (HOSPITAL_COMMUNITY): Payer: Self-pay

## 2022-07-07 ENCOUNTER — Encounter: Payer: Self-pay | Admitting: Internal Medicine

## 2022-07-07 MED ORDER — ERGOCALCIFEROL 1.25 MG (50000 UT) PO CAPS
50000.0000 [IU] | ORAL_CAPSULE | ORAL | 0 refills | Status: DC
  Filled 2022-07-07: qty 12, 84d supply, fill #0

## 2022-07-07 NOTE — Telephone Encounter (Signed)
Faxed to number provided

## 2022-07-07 NOTE — Telephone Encounter (Signed)
DME order has been placed in quick sign folder.  

## 2022-07-08 ENCOUNTER — Other Ambulatory Visit (HOSPITAL_COMMUNITY): Payer: Self-pay

## 2022-07-08 ENCOUNTER — Ambulatory Visit: Payer: Medicare Other | Admitting: Internal Medicine

## 2022-07-12 ENCOUNTER — Other Ambulatory Visit (HOSPITAL_COMMUNITY): Payer: Self-pay

## 2022-07-14 ENCOUNTER — Other Ambulatory Visit: Payer: Self-pay

## 2022-07-15 ENCOUNTER — Other Ambulatory Visit (HOSPITAL_COMMUNITY): Payer: Self-pay

## 2022-07-15 ENCOUNTER — Encounter: Payer: Self-pay | Admitting: Internal Medicine

## 2022-07-15 ENCOUNTER — Ambulatory Visit (INDEPENDENT_AMBULATORY_CARE_PROVIDER_SITE_OTHER): Payer: Medicare Other | Admitting: Internal Medicine

## 2022-07-15 VITALS — BP 130/76 | HR 84 | Temp 98.0°F | Ht 70.0 in | Wt 287.4 lb

## 2022-07-15 DIAGNOSIS — E785 Hyperlipidemia, unspecified: Secondary | ICD-10-CM | POA: Diagnosis not present

## 2022-07-15 DIAGNOSIS — I10 Essential (primary) hypertension: Secondary | ICD-10-CM

## 2022-07-15 DIAGNOSIS — E559 Vitamin D deficiency, unspecified: Secondary | ICD-10-CM

## 2022-07-15 DIAGNOSIS — K760 Fatty (change of) liver, not elsewhere classified: Secondary | ICD-10-CM

## 2022-07-15 DIAGNOSIS — I152 Hypertension secondary to endocrine disorders: Secondary | ICD-10-CM

## 2022-07-15 MED ORDER — ONETOUCH ULTRA VI STRP
1.0000 | ORAL_STRIP | Freq: Four times a day (QID) | 0 refills | Status: DC
  Filled 2022-07-15: qty 100, 25d supply, fill #0

## 2022-07-15 MED ORDER — ONETOUCH DELICA LANCETS 33G MISC
1.0000 | Freq: Four times a day (QID) | 0 refills | Status: DC
  Filled 2022-07-15: qty 100, 25d supply, fill #0

## 2022-07-15 MED ORDER — ERGOCALCIFEROL 1.25 MG (50000 UT) PO CAPS
50000.0000 [IU] | ORAL_CAPSULE | ORAL | 0 refills | Status: DC
  Filled 2022-07-15 – 2022-09-22 (×2): qty 12, 84d supply, fill #0

## 2022-07-15 MED ORDER — PANTOPRAZOLE SODIUM 40 MG PO TBEC
40.0000 mg | DELAYED_RELEASE_TABLET | Freq: Every day | ORAL | 3 refills | Status: DC
  Filled 2022-07-15: qty 90, fill #0
  Filled 2022-07-19: qty 90, 90d supply, fill #0
  Filled 2022-10-14: qty 90, 90d supply, fill #1
  Filled 2023-01-09: qty 90, 90d supply, fill #2
  Filled 2023-04-12: qty 90, 90d supply, fill #3

## 2022-07-15 MED ORDER — ALLOPURINOL 300 MG PO TABS
300.0000 mg | ORAL_TABLET | Freq: Every day | ORAL | 3 refills | Status: DC
  Filled 2022-07-15: qty 90, fill #0
  Filled 2022-08-14: qty 90, 90d supply, fill #0
  Filled 2022-11-07: qty 90, 90d supply, fill #1
  Filled 2023-02-05: qty 90, 90d supply, fill #2
  Filled 2023-05-14: qty 90, 90d supply, fill #3

## 2022-07-15 MED ORDER — METOPROLOL SUCCINATE ER 25 MG PO TB24
25.0000 mg | ORAL_TABLET | Freq: Every day | ORAL | 3 refills | Status: DC
  Filled 2022-07-15: qty 90, 90d supply, fill #0
  Filled 2022-10-04: qty 90, 90d supply, fill #1
  Filled 2023-01-05: qty 90, 90d supply, fill #2
  Filled 2023-04-01: qty 90, 90d supply, fill #3

## 2022-07-15 MED ORDER — EMPAGLIFLOZIN 25 MG PO TABS
25.0000 mg | ORAL_TABLET | Freq: Every day | ORAL | 1 refills | Status: DC
  Filled 2022-07-15 – 2022-09-07 (×2): qty 90, 90d supply, fill #0
  Filled 2022-12-03: qty 90, 90d supply, fill #1

## 2022-07-15 NOTE — Progress Notes (Unsigned)
Subjective:  Patient ID: Christopher Stewart, male    DOB: [birth year]  Age: 67 y.o. MRN: [id number]  CC: The primary encounter diagnosis was Obesity, diabetes, and hypertension syndrome (HCC). Diagnoses of Hyperlipidemia LDL goal <100, Morbid obesity (HCC), Vitamin D deficiency, Essential hypertension, and Hepatic steatosis were also pertinent to this visit.   HPI Christopher Stewart presents for  Chief Complaint  Patient presents with   Medical Management of Chronic Issues    1) morbid obesity : Dr Kristin Bruins has lost 13 lbs since last visit,  has resumed  walking daily,  using Ozempic and following a low carb diet.  2) type 2 DM: He feels generally well, is walking  several times per week and checking blood sugars once daily at variable times.  BS have been under 130 fasting and < 150 post prandially.  Denies any recent hypoglyemic events.  Taking his medications as directed. Following a carbohydrate modified diet 6 days per week. Denies numbness, burning and tingling of extremities. Appetite is diminished appropriately with Ozempic. Christopher Stewart    3) Hypertension: patient checks blood pressure twice daily  at home.  Readings have been for the most part <130/80 at rest .except early morning .  Taking telmisartan at night along with terazosin,  and amlodipine in the morning.  He is taking maximal doses.   Patient is following a reduced salt diet most days and is taking medications as prescribed      Outpatient Medications Prior to Visit  Medication Sig Dispense Refill   AMBULATORY NON FORMULARY MEDICATION Place 30 g rectally 2 (two) times daily. Medication Name: Diltiazem 2% / lidocaine 5%  Cream 50/50 mix  Insert into anal canal twice daily 30 g 3   amLODipine (NORVASC) 10 MG tablet Take 1 tablet (10 mg total) by mouth daily. 90 tablet 1   aspirin 81 MG tablet Take 81 mg by mouth every other day.     blood glucose meter kit and supplies Use up to four times daily as directed 1 each 0   celecoxib  (CELEBREX) 200 MG capsule Take 1 capsule (200 mg total) by mouth 2 (two) times daily. 180 capsule 1   eszopiclone (LUNESTA) 2 MG TABS tablet Take 1 tablet (2 mg total) by mouth immediately before bedtime as needed for sleep. 30 tablet 5   glimepiride (AMARYL) 1 MG tablet Take 1 tablet (1 mg total) by mouth 2 (two) times daily. 180 tablet 1   metFORMIN (GLUCOPHAGE) 500 MG tablet Take 2 tablets (1,000 mg total) by mouth 2 (two) times daily AND 1 tablet (500 mg total) daily with lunch. 450 tablet 3   rosuvastatin (CRESTOR) 10 MG tablet Take 1 tablet (10 mg total) by mouth every other day. 45 tablet 1   Semaglutide, 2 MG/DOSE, (OZEMPIC, 2 MG/DOSE,) 8 MG/3ML SOPN Inject 2 mg into the skin once a week. 9 mL 1   telmisartan (MICARDIS) 80 MG tablet Take 1 tablet (80 mg total) by mouth daily. 90 tablet 1   terazosin (HYTRIN) 5 MG capsule TAKE 1 CAPSULE BY MOUTH AT BEDTIME 90 capsule 3   allopurinol (ZYLOPRIM) 300 MG tablet TAKE 1 TABLET BY MOUTH DAILY. 90 tablet 3   empagliflozin (JARDIANCE) 25 MG TABS tablet Take 1 tablet (25 mg total) by mouth daily. 90 tablet 1   ergocalciferol (DRISDOL) 1.25 MG (50000 UT) capsule Take 1 capsule by mouth once a week. 12 capsule 0   glucose blood (ONETOUCH ULTRA) test strip Use 1 each  to check blood sugar up to 4 (four) times daily as directed. 100 each 0   OneTouch Delica Lancets 33G MISC Use 1 each to check blood sugar up to 4 (four) times daily as directed 100 each 0   pantoprazole (PROTONIX) 40 MG tablet TAKE 1 TABLET BY MOUTH DAILY. 90 tablet 3   oseltamivir (TAMIFLU) 75 MG capsule Take 1 capsule by mouth 2 times daily. (Patient not taking: Reported on 12/30/2021) 10 capsule 0   Facility-Administered Medications Prior to Visit  Medication Dose Route Frequency Provider Last Rate Last Admin   0.9 %  sodium chloride infusion  500 mL Intravenous Once Iva Boop, MD        Review of Systems;  Patient denies headache, fevers, malaise, unintentional weight loss,  skin rash, eye pain, sinus congestion and sinus pain, sore throat, dysphagia,  hemoptysis , cough, dyspnea, wheezing, chest pain, palpitations, orthopnea, edema, abdominal pain, nausea, melena, diarrhea, constipation, flank pain, dysuria, hematuria, urinary  Frequency, nocturia, numbness, tingling, seizures,  Focal weakness, Loss of consciousness,  Tremor, insomnia, depression, anxiety, and suicidal ideation.      Objective:  BP 130/76   Pulse 84   Temp 98 F (36.7 C) (Oral)   Ht 5\' 10"  (1.778 m)   Wt 287 lb 6.4 oz (130.4 kg)   SpO2 96%   BMI 41.24 kg/m   BP Readings from Last 3 Encounters:  07/17/22 130/76  12/30/21 132/82  11/25/21 (!) 174/94    Wt Readings from Last 3 Encounters:  07/15/22 287 lb 6.4 oz (130.4 kg)  12/30/21 (!) 300 lb 3.2 oz (136.2 kg)  07/02/21 297 lb 6.4 oz (134.9 kg)    Physical Exam Vitals reviewed.  Constitutional:      General: He is not in acute distress.    Appearance: Normal appearance. He is normal weight. He is not ill-appearing, toxic-appearing or diaphoretic.  HENT:     Head: Normocephalic.  Eyes:     General: No scleral icterus.       Right eye: No discharge.        Left eye: No discharge.     Conjunctiva/sclera: Conjunctivae normal.  Cardiovascular:     Rate and Rhythm: Normal rate and regular rhythm.     Heart sounds: Normal heart sounds.  Pulmonary:     Effort: Pulmonary effort is normal. No respiratory distress.     Breath sounds: Normal breath sounds.  Musculoskeletal:        General: Normal range of motion.     Cervical back: Normal range of motion.  Skin:    General: Skin is warm and dry.  Neurological:     General: No focal deficit present.     Mental Status: He is alert and oriented to person, place, and time. Mental status is at baseline.  Psychiatric:        Mood and Affect: Mood normal.        Behavior: Behavior normal.        Thought Content: Thought content normal.        Judgment: Judgment normal.     Lab  Results  Component Value Date   HGBA1C 6.4 07/04/2022   HGBA1C 7.6 (H) 12/26/2021   HGBA1C 7.1 (H) 06/21/2021    Lab Results  Component Value Date   CREATININE 1.06 07/04/2022   CREATININE 1.09 12/26/2021   CREATININE 1.03 06/21/2021    Lab Results  Component Value Date   WBC 6.8 07/04/2022   HGB 14.0 07/04/2022  HCT 43.3 07/04/2022   PLT 316.0 07/04/2022   GLUCOSE 101 (H) 07/04/2022   CHOL 98 07/04/2022   TRIG 117.0 07/04/2022   HDL 24.80 (L) 07/04/2022   LDLDIRECT 62.0 07/04/2022   LDLCALC 50 07/04/2022   ALT 31 07/04/2022   AST 19 07/04/2022   NA 141 07/04/2022   K 3.9 07/04/2022   CL 106 07/04/2022   CREATININE 1.06 07/04/2022   BUN 18 07/04/2022   CO2 26 07/04/2022   TSH 3.12 07/04/2022   PSA 0.31 07/04/2022   HGBA1C 6.4 07/04/2022   MICROALBUR 1.7 12/26/2021    No results found.  Assessment & Plan:  .Obesity, diabetes, and hypertension syndrome (HCC) -     Comprehensive metabolic panel; Future -     Hemoglobin A1c; Future  Hyperlipidemia LDL goal <100 Assessment & Plan: Managed with every other day crestor 10 mg .  LDL is <70 per recent panel.  No changes today   Lab Results  Component Value Date   CHOL 98 07/04/2022   HDL 24.80 (L) 07/04/2022   LDLCALC 50 07/04/2022   LDLDIRECT 62.0 07/04/2022   TRIG 117.0 07/04/2022   CHOLHDL 4 07/04/2022   Lab Results  Component Value Date   ALT 31 07/04/2022   AST 19 07/04/2022   ALKPHOS 58 07/04/2022   BILITOT 0.6 07/04/2022     Orders: -     Lipid panel; Future -     LDL cholesterol, direct; Future  Morbid obesity (HCC)  Vitamin D deficiency -     VITAMIN D 25 Hydroxy (Vit-D Deficiency, Fractures); Future  Essential hypertension Assessment & Plan: No change with trial of celebrex suspension.  Home readings are at goal.  No changes today:  continue amlodipine anf telsiartas.   Lab Results  Component Value Date   CREATININE 1.06 07/04/2022   Lab Results  Component Value Date   NA 141  07/04/2022   K 3.9 07/04/2022   CL 106 07/04/2022   CO2 26 07/04/2022      Hepatic steatosis Assessment & Plan: Managed with  metformin, ozemipic, and statin.  Weight loss  occurring and LFTS normal/ no changes today  Lab Results  Component Value Date   ALT 31 07/04/2022   AST 19 07/04/2022   ALKPHOS 58 07/04/2022   BILITOT 0.6 07/04/2022      Other orders -     Allopurinol; TAKE 1 TABLET BY MOUTH DAILY.  Dispense: 90 tablet; Refill: 3 -     Empagliflozin; Take 1 tablet (25 mg total) by mouth at bedtime.  Dispense: 90 tablet; Refill: 1 -     Pantoprazole Sodium; TAKE 1 TABLET BY MOUTH DAILY.  Dispense: 90 tablet; Refill: 3 -     OneTouch Ultra; Use 1 each to check blood sugar up to 4 (four) times daily as directed.  Dispense: 100 each; Refill: 0 -     OneTouch Delica Lancets 33G; Use 1 each to check blood sugar up to 4 (four) times daily as directed  Dispense: 100 each; Refill: 0 -     Metoprolol Succinate ER; Take 1 tablet (25 mg total) by mouth daily.  Dispense: 90 tablet; Refill: 3 -     Ergocalciferol; Take 1 capsule by mouth once a week.  Dispense: 12 capsule; Refill: 0     I provided 30 minutes of face-to-face time during this encounter reviewing patient's last visit with me, recent surgical and non surgical procedures, previous  labs and imaging studies, counseling on currently addressed  issues,  and post visit ordering to diagnostics and therapeutics .   Follow-up: No follow-ups on file.   Crecencio Mc, MD

## 2022-07-16 ENCOUNTER — Other Ambulatory Visit: Payer: Self-pay

## 2022-07-16 ENCOUNTER — Other Ambulatory Visit (HOSPITAL_COMMUNITY): Payer: Self-pay

## 2022-07-17 ENCOUNTER — Encounter: Payer: Self-pay | Admitting: Internal Medicine

## 2022-07-17 NOTE — Assessment & Plan Note (Addendum)
No change with trial of celebrex suspension.  Home readings are at goal.  No changes today:  continue amlodipine anf telsiartas.   Lab Results  Component Value Date   CREATININE 1.06 07/04/2022   Lab Results  Component Value Date   NA 141 07/04/2022   K 3.9 07/04/2022   CL 106 07/04/2022   CO2 26 07/04/2022

## 2022-07-17 NOTE — Assessment & Plan Note (Signed)
Managed with every other day crestor 10 mg .  LDL is <70 per recent panel.  No changes today   Lab Results  Component Value Date   CHOL 98 07/04/2022   HDL 24.80 (L) 07/04/2022   LDLCALC 50 07/04/2022   LDLDIRECT 62.0 07/04/2022   TRIG 117.0 07/04/2022   CHOLHDL 4 07/04/2022   Lab Results  Component Value Date   ALT 31 07/04/2022   AST 19 07/04/2022   ALKPHOS 58 07/04/2022   BILITOT 0.6 07/04/2022

## 2022-07-17 NOTE — Assessment & Plan Note (Signed)
Managed with  metformin, ozemipic, and statin.  Weight loss  occurring and LFTS normal/ no changes today  Lab Results  Component Value Date   ALT 31 07/04/2022   AST 19 07/04/2022   ALKPHOS 58 07/04/2022   BILITOT 0.6 07/04/2022

## 2022-07-19 ENCOUNTER — Other Ambulatory Visit (HOSPITAL_COMMUNITY): Payer: Self-pay

## 2022-07-22 ENCOUNTER — Other Ambulatory Visit: Payer: Self-pay

## 2022-07-22 ENCOUNTER — Other Ambulatory Visit (HOSPITAL_COMMUNITY): Payer: Self-pay

## 2022-07-23 DIAGNOSIS — G4733 Obstructive sleep apnea (adult) (pediatric): Secondary | ICD-10-CM | POA: Diagnosis not present

## 2022-08-07 ENCOUNTER — Other Ambulatory Visit: Payer: Self-pay

## 2022-08-14 ENCOUNTER — Other Ambulatory Visit: Payer: Self-pay

## 2022-08-23 DIAGNOSIS — G4733 Obstructive sleep apnea (adult) (pediatric): Secondary | ICD-10-CM | POA: Diagnosis not present

## 2022-08-26 ENCOUNTER — Other Ambulatory Visit (HOSPITAL_COMMUNITY): Payer: Self-pay

## 2022-08-28 ENCOUNTER — Encounter (INDEPENDENT_AMBULATORY_CARE_PROVIDER_SITE_OTHER): Payer: Self-pay

## 2022-09-04 ENCOUNTER — Other Ambulatory Visit: Payer: Self-pay

## 2022-09-07 ENCOUNTER — Other Ambulatory Visit (HOSPITAL_COMMUNITY): Payer: Self-pay

## 2022-09-08 ENCOUNTER — Other Ambulatory Visit: Payer: Self-pay

## 2022-09-08 ENCOUNTER — Other Ambulatory Visit (HOSPITAL_COMMUNITY): Payer: Self-pay

## 2022-09-08 ENCOUNTER — Encounter (HOSPITAL_COMMUNITY): Payer: Self-pay

## 2022-09-09 ENCOUNTER — Other Ambulatory Visit: Payer: Self-pay

## 2022-09-11 ENCOUNTER — Other Ambulatory Visit (HOSPITAL_COMMUNITY): Payer: Self-pay

## 2022-09-22 ENCOUNTER — Other Ambulatory Visit: Payer: Self-pay

## 2022-09-22 ENCOUNTER — Other Ambulatory Visit (HOSPITAL_COMMUNITY): Payer: Self-pay

## 2022-09-23 ENCOUNTER — Ambulatory Visit (INDEPENDENT_AMBULATORY_CARE_PROVIDER_SITE_OTHER): Payer: Medicare Other | Admitting: Internal Medicine

## 2022-09-23 ENCOUNTER — Other Ambulatory Visit (HOSPITAL_COMMUNITY): Payer: Self-pay

## 2022-09-23 ENCOUNTER — Encounter: Payer: Self-pay | Admitting: Internal Medicine

## 2022-09-23 VITALS — BP 130/66 | HR 95 | Ht 70.0 in | Wt 293.4 lb

## 2022-09-23 DIAGNOSIS — G4733 Obstructive sleep apnea (adult) (pediatric): Secondary | ICD-10-CM | POA: Diagnosis not present

## 2022-09-23 DIAGNOSIS — E669 Obesity, unspecified: Secondary | ICD-10-CM | POA: Diagnosis not present

## 2022-09-23 DIAGNOSIS — E1169 Type 2 diabetes mellitus with other specified complication: Secondary | ICD-10-CM

## 2022-09-23 DIAGNOSIS — I10 Essential (primary) hypertension: Secondary | ICD-10-CM

## 2022-09-23 DIAGNOSIS — E1159 Type 2 diabetes mellitus with other circulatory complications: Secondary | ICD-10-CM

## 2022-09-23 DIAGNOSIS — Z7985 Long-term (current) use of injectable non-insulin antidiabetic drugs: Secondary | ICD-10-CM

## 2022-09-23 DIAGNOSIS — I152 Hypertension secondary to endocrine disorders: Secondary | ICD-10-CM

## 2022-09-23 DIAGNOSIS — Z7984 Long term (current) use of oral hypoglycemic drugs: Secondary | ICD-10-CM

## 2022-09-23 MED ORDER — AMLODIPINE BESYLATE 10 MG PO TABS
10.0000 mg | ORAL_TABLET | Freq: Every day | ORAL | 1 refills | Status: DC
  Filled 2022-09-23 – 2022-11-24 (×2): qty 90, 90d supply, fill #0
  Filled 2023-02-24: qty 90, 90d supply, fill #1

## 2022-09-23 MED ORDER — OZEMPIC (2 MG/DOSE) 8 MG/3ML ~~LOC~~ SOPN
2.0000 mg | PEN_INJECTOR | SUBCUTANEOUS | 1 refills | Status: DC
  Filled 2022-09-23 – 2022-09-25 (×2): qty 9, 84d supply, fill #0
  Filled 2022-12-19: qty 9, 84d supply, fill #1

## 2022-09-23 MED ORDER — ROSUVASTATIN CALCIUM 10 MG PO TABS
10.0000 mg | ORAL_TABLET | ORAL | 1 refills | Status: DC
  Filled 2022-09-23 – 2022-10-17 (×2): qty 45, 90d supply, fill #0
  Filled 2023-01-13: qty 45, 90d supply, fill #1

## 2022-09-23 MED ORDER — GLIMEPIRIDE 1 MG PO TABS
1.0000 mg | ORAL_TABLET | Freq: Two times a day (BID) | ORAL | 1 refills | Status: DC
  Filled 2022-09-23 – 2022-10-14 (×2): qty 180, 90d supply, fill #0
  Filled 2023-01-09: qty 180, 90d supply, fill #1

## 2022-09-23 MED ORDER — TELMISARTAN 80 MG PO TABS
80.0000 mg | ORAL_TABLET | Freq: Every day | ORAL | 1 refills | Status: DC
  Filled 2022-09-23 – 2022-12-19 (×2): qty 90, 90d supply, fill #0
  Filled 2023-03-17: qty 90, 90d supply, fill #1

## 2022-09-23 NOTE — Assessment & Plan Note (Signed)
Advised to reduce dose of amlodipine to manage  recurrent hypotension and resultant tachycardia

## 2022-09-23 NOTE — Patient Instructions (Addendum)
BP may be dropping too low and causing tachycardia.  I recommen that you reduce the amlodipine to 5 mg and continue 80 telmisartan  If resulting BP 's become > 140 consistently,  change to the following regimen: .  Reduce the telmisartan to 40 mg  and resume 10 mg amlodipine   A1c can be repeated (with or without the other labs) on Sept 22 or later

## 2022-09-23 NOTE — Assessment & Plan Note (Signed)
Diagnosed by sleep study in 2011.  He  is wearing his CPAP every night a minimum of 6 hours per night and notes improved daytime wakefulness and decreased fatigue  resulting from reduction in AHI to 1.2 /hr

## 2022-09-23 NOTE — Progress Notes (Signed)
Subjective:  Patient ID: Christopher Stewart, male    DOB: 1955/02/26  Age: 67 y.o. MRN: [id number]  CC: The primary encounter diagnosis was Obstructive sleep apnea. Diagnoses of Obesity, diabetes, and hypertension syndrome (HCC) and Essential hypertension were also pertinent to this visit.   HPI Christopher Stewart presents for  Chief Complaint  Patient presents with   CPAP Compliance   1) OSA:  split night study has been  reviewed  from 2011 showing severe OSA with AHI of 52 with desats to 82%  .   He has been  wearing his  CPAP every night a minimum of 6 hours per night and notes improved daytime wakefulness and decreased fatigue.  Received a new unit in June.  Working well.  The new machine auto adjusts his pressure and reports usage and events.  He is averaging 1.5 episodes per hour per last reading this week.   Uses [facility]  for supplies.    2) HTN:  home readings reviewed;  all are  under 130/80 with low of 108 systolic on several occasions at 3 pm , acc'd by pulse > 100 when systolic < 120.  Taking metoprolol,  amlodipine  in the morning (10 mg amlodipine dose) and telmisartan 80 mg at night.    3) Morbid obesity:  weight gain of 6-lbs since July noted , not walking regularly since [facility],  discouraged by the heat and humidifty   4) Type 2 DM: using jardiance 25 mg  and Ozempic at 2 mg dose.   5) Caregiver burden:  his wife has had a difficult time and is not tolerating enbrel and her current pain regimen .  The lesser dose of aspirin has reuslted in more joint pain .  Waiting to see nephrology.  Appointment was cancelled on the day of her visit and moved to October.   Outpatient Medications Prior to Visit  Medication Sig Dispense Refill   allopurinol (ZYLOPRIM) 300 MG tablet Take 1 tablet (300 mg total) by mouth daily. 90 tablet 3   AMBULATORY NON FORMULARY MEDICATION Place 30 g rectally 2 (two) times daily. Medication Name: Diltiazem 2% / lidocaine  5%  Cream 50/50 mix  Insert into anal canal twice daily 30 g 3   aspirin 81 MG tablet Take 81 mg by mouth every other day.     blood glucose meter kit and supplies Use up to four times daily as directed 1 each 0   celecoxib (CELEBREX) 200 MG capsule Take 1 capsule (200 mg total) by mouth 2 (two) times daily. 180 capsule 1   empagliflozin (JARDIANCE) 25 MG TABS tablet Take 1 tablet (25 mg total) by mouth at bedtime. 90 tablet 1   ergocalciferol (DRISDOL) 1.25 MG (50000 UT) capsule Take 1 capsule (50,000 Units total) by mouth once a week. 12 capsule 0   eszopiclone (LUNESTA) 2 MG TABS tablet Take 1 tablet (2 mg total) by mouth immediately before bedtime as needed for sleep. 30 tablet 5   glucose blood (ONETOUCH ULTRA) test strip Use 1 each to check blood sugar up to 4 (four) times daily as directed. 100 each 0   metFORMIN (GLUCOPHAGE) 500 MG tablet Take 2 tablets (1,000 mg total) by mouth 2 (two) times daily AND 1 tablet (500 mg total) daily with lunch. 450 tablet 3   metoprolol succinate (TOPROL-XL) 25 MG 24 hr tablet Take 1 tablet (25 mg total) by mouth daily. 90 tablet 3   OneTouch Delica  Lancets 33G MISC Use 1 each to check blood sugar up to 4 (four) times daily as directed 100 each 0   pantoprazole (PROTONIX) 40 MG tablet Take 1 tablet (40 mg total) by mouth daily. 90 tablet 3   terazosin (HYTRIN) 5 MG capsule TAKE 1 CAPSULE BY MOUTH AT BEDTIME 90 capsule 3   amLODipine (NORVASC) 10 MG tablet Take 1 tablet (10 mg total) by mouth daily. 90 tablet 1   glimepiride (AMARYL) 1 MG tablet Take 1 tablet (1 mg total) by mouth 2 (two) times daily. 180 tablet 1   rosuvastatin (CRESTOR) 10 MG tablet Take 1 tablet (10 mg total) by mouth every other day. 45 tablet 1   Semaglutide, 2 MG/DOSE, (OZEMPIC, 2 MG/DOSE,) 8 MG/3ML SOPN Inject 2 mg into the skin once a week. 9 mL 1   telmisartan (MICARDIS) 80 MG tablet Take 1 tablet (80 mg total) by mouth daily. 90 tablet 1   Facility-Administered Medications Prior  to Visit  Medication Dose Route Frequency Provider Last Rate Last Admin   0.9 %  sodium chloride infusion  500 mL Intravenous Once Iva Boop, MD        Review of Systems;  Patient denies headache, fevers, malaise, unintentional weight loss, skin rash, eye pain, sinus congestion and sinus pain, sore throat, dysphagia,  hemoptysis , cough, dyspnea, wheezing, chest pain, palpitations, orthopnea, edema, abdominal pain, nausea, melena, diarrhea, constipation, flank pain, dysuria, hematuria, urinary  Frequency, nocturia, numbness, tingling, seizures,  Focal weakness, Loss of consciousness,  Tremor, insomnia, depression, anxiety, and suicidal ideation.      Objective:  BP 130/66   Pulse 95   Ht 5\' 10"  (1.778 m)   Wt 293 lb 6.4 oz (133.1 kg)   SpO2 94%   BMI 42.10 kg/m   BP Readings from Last 3 Encounters:  09/23/22 130/66  07/17/22 130/76  12/30/21 132/82    Wt Readings from Last 3 Encounters:  09/23/22 293 lb 6.4 oz (133.1 kg)  07/15/22 287 lb 6.4 oz (130.4 kg)  12/30/21 (!) 300 lb 3.2 oz (136.2 kg)    Physical Exam Vitals reviewed.  Constitutional:      General: He is not in acute distress.    Appearance: Normal appearance. He is normal weight. He is not ill-appearing, toxic-appearing or diaphoretic.  HENT:     Head: Normocephalic.  Eyes:     General: No scleral icterus.       Right eye: No discharge.        Left eye: No discharge.     Conjunctiva/sclera: Conjunctivae normal.  Cardiovascular:     Rate and Rhythm: Normal rate and regular rhythm.     Heart sounds: Normal heart sounds.  Pulmonary:     Effort: Pulmonary effort is normal. No respiratory distress.     Breath sounds: Normal breath sounds.  Musculoskeletal:        General: Normal range of motion.     Cervical back: Normal range of motion.  Skin:    General: Skin is warm and dry.  Neurological:     General: No focal deficit present.     Mental Status: He is alert and oriented to person, place, and  time. Mental status is at baseline.  Psychiatric:        Mood and Affect: Mood normal.        Behavior: Behavior normal.        Thought Content: Thought content normal.        Judgment:  Judgment normal.    Lab Results  Component Value Date   HGBA1C 6.4 07/04/2022   HGBA1C 7.6 (H) 12/26/2021   HGBA1C 7.1 (H) 06/21/2021    Lab Results  Component Value Date   CREATININE 1.06 07/04/2022   CREATININE 1.09 12/26/2021   CREATININE 1.03 06/21/2021    Lab Results  Component Value Date   WBC 6.8 07/04/2022   HGB 14.0 07/04/2022   HCT 43.3 07/04/2022   PLT 316.0 07/04/2022   GLUCOSE 101 (H) 07/04/2022   CHOL 98 07/04/2022   TRIG 117.0 07/04/2022   HDL 24.80 (L) 07/04/2022   LDLDIRECT 62.0 07/04/2022   LDLCALC 50 07/04/2022   ALT 31 07/04/2022   AST 19 07/04/2022   NA 141 07/04/2022   K 3.9 07/04/2022   CL 106 07/04/2022   CREATININE 1.06 07/04/2022   BUN 18 07/04/2022   CO2 26 07/04/2022   TSH 3.12 07/04/2022   PSA 0.31 07/04/2022   HGBA1C 6.4 07/04/2022   MICROALBUR 1.7 12/26/2021    No results found.  Assessment & Plan:  .Obstructive sleep apnea Assessment & Plan: Diagnosed by sleep study in 2011.  He  is wearing his CPAP every night a minimum of 6 hours per night and notes improved daytime wakefulness and decreased fatigue  resulting from reduction in AHI to 1.2 /hr    Obesity, diabetes, and hypertension syndrome (HCC) Assessment & Plan: He is tolerating an increased dose of glimepiride to 2 mg daily and the  increased dose of  Ozempic due to patient's stress eating.  continue statin and ARB  Encouraged to resume walking .   Lab Results  Component Value Date   HGBA1C 6.4 07/04/2022   Lab Results  Component Value Date   CREATININE 1.06 07/04/2022   Lab Results  Component Value Date   MICROALBUR 1.7 12/26/2021   MICROALBUR 0.2 06/15/2020        Essential hypertension Assessment & Plan: Advised to reduce dose of amlodipine to manage  recurrent  hypotension and resultant tachycardia    Other orders -     amLODIPine Besylate; Take 1 tablet (10 mg total) by mouth daily.  Dispense: 90 tablet; Refill: 1 -     Glimepiride; Take 1 tablet (1 mg total) by mouth 2 (two) times daily.  Dispense: 180 tablet; Refill: 1 -     Rosuvastatin Calcium; Take 1 tablet (10 mg total) by mouth every other day.  Dispense: 45 tablet; Refill: 1 -     Ozempic (2 MG/DOSE); Inject 2 mg into the skin once a week.  Dispense: 9 mL; Refill: 1 -     Telmisartan; Take 1 tablet (80 mg total) by mouth daily.  Dispense: 90 tablet; Refill: 1     I provided 37 minutes of face-to-face time during this encounter reviewing patient's last visit with me, previous non surgical procedures, previous  labs and imaging studies, counseling on currently addressed issues,  and post visit ordering to diagnostics and therapeutics .   Follow-up: Return in about 6 months (around 03/23/2023).   Sherlene Shams, MD

## 2022-09-23 NOTE — Assessment & Plan Note (Signed)
He is tolerating an increased dose of glimepiride to 2 mg daily and the  increased dose of  Ozempic due to patient's stress eating.  continue statin and ARB  Encouraged to resume walking .   Lab Results  Component Value Date   HGBA1C 6.4 07/04/2022   Lab Results  Component Value Date   CREATININE 1.06 07/04/2022   Lab Results  Component Value Date   MICROALBUR 1.7 12/26/2021   MICROALBUR 0.2 06/15/2020

## 2022-09-24 ENCOUNTER — Other Ambulatory Visit (HOSPITAL_COMMUNITY): Payer: Self-pay

## 2022-09-25 ENCOUNTER — Other Ambulatory Visit (HOSPITAL_COMMUNITY): Payer: Self-pay

## 2022-10-04 ENCOUNTER — Other Ambulatory Visit (HOSPITAL_COMMUNITY): Payer: Self-pay

## 2022-10-11 ENCOUNTER — Other Ambulatory Visit (HOSPITAL_COMMUNITY): Payer: Self-pay

## 2022-10-13 ENCOUNTER — Other Ambulatory Visit (HOSPITAL_COMMUNITY): Payer: Self-pay

## 2022-10-13 ENCOUNTER — Other Ambulatory Visit: Payer: Self-pay

## 2022-10-14 ENCOUNTER — Other Ambulatory Visit (HOSPITAL_BASED_OUTPATIENT_CLINIC_OR_DEPARTMENT_OTHER): Payer: Self-pay

## 2022-10-14 ENCOUNTER — Other Ambulatory Visit: Payer: Self-pay

## 2022-10-14 ENCOUNTER — Other Ambulatory Visit: Payer: Self-pay | Admitting: Internal Medicine

## 2022-10-15 ENCOUNTER — Other Ambulatory Visit (HOSPITAL_COMMUNITY): Payer: Self-pay

## 2022-10-15 ENCOUNTER — Other Ambulatory Visit: Payer: Self-pay

## 2022-10-15 MED ORDER — ONETOUCH DELICA LANCETS 33G MISC
1.0000 | Freq: Four times a day (QID) | 0 refills | Status: DC
  Filled 2022-10-15: qty 100, 25d supply, fill #0

## 2022-10-15 MED ORDER — ONETOUCH ULTRA TEST VI STRP
1.0000 | ORAL_STRIP | Freq: Four times a day (QID) | 1 refills | Status: AC
  Filled 2022-10-15: qty 100, 25d supply, fill #0
  Filled 2023-05-04: qty 100, 25d supply, fill #1

## 2022-10-17 ENCOUNTER — Other Ambulatory Visit: Payer: Self-pay

## 2022-10-17 ENCOUNTER — Other Ambulatory Visit (HOSPITAL_COMMUNITY): Payer: Self-pay

## 2022-10-23 DIAGNOSIS — G4733 Obstructive sleep apnea (adult) (pediatric): Secondary | ICD-10-CM | POA: Diagnosis not present

## 2022-10-26 ENCOUNTER — Telehealth: Payer: Medicare Other | Admitting: Family Medicine

## 2022-10-26 DIAGNOSIS — N390 Urinary tract infection, site not specified: Secondary | ICD-10-CM

## 2022-10-26 MED ORDER — CEPHALEXIN 500 MG PO CAPS: 500.0000 mg | ORAL_CAPSULE | Freq: Two times a day (BID) | ORAL | 0 refills | Status: AC

## 2022-10-26 NOTE — Progress Notes (Signed)

## 2022-10-27 ENCOUNTER — Encounter: Payer: Self-pay | Admitting: Internal Medicine

## 2022-11-03 ENCOUNTER — Encounter: Payer: Self-pay | Admitting: Internal Medicine

## 2022-11-05 ENCOUNTER — Telehealth: Payer: Self-pay | Admitting: Internal Medicine

## 2022-11-05 NOTE — Telephone Encounter (Addendum)
[employer] called stating they have denied the claim for the patient's CPAP supplies with [facility]; however, they do have a previous approval with [employer] CPT code [phone]. If the patient is needing CPAP supplies it would be advantageous to send the DME order to [facility] and extend the length of time for future supplies. Ref# with [employer] is [id number]. The approval will expire on 11/09/22.

## 2022-11-06 NOTE — Telephone Encounter (Signed)
Pt called about previous message regarding the cpap machine

## 2022-11-07 ENCOUNTER — Other Ambulatory Visit: Payer: Self-pay

## 2022-11-10 ENCOUNTER — Other Ambulatory Visit: Payer: Self-pay

## 2022-11-10 DIAGNOSIS — G4733 Obstructive sleep apnea (adult) (pediatric): Secondary | ICD-10-CM

## 2022-11-10 NOTE — Telephone Encounter (Signed)
Noted, order has been faxed to [phone]

## 2022-11-10 NOTE — Telephone Encounter (Signed)
The order has been singed

## 2022-11-24 ENCOUNTER — Other Ambulatory Visit (HOSPITAL_COMMUNITY): Payer: Self-pay

## 2022-12-01 DIAGNOSIS — H40013 Open angle with borderline findings, low risk, bilateral: Secondary | ICD-10-CM | POA: Diagnosis not present

## 2022-12-01 DIAGNOSIS — E119 Type 2 diabetes mellitus without complications: Secondary | ICD-10-CM | POA: Diagnosis not present

## 2022-12-01 LAB — HM DIABETES EYE EXAM

## 2022-12-03 ENCOUNTER — Other Ambulatory Visit: Payer: Self-pay | Admitting: Internal Medicine

## 2022-12-03 ENCOUNTER — Other Ambulatory Visit (HOSPITAL_COMMUNITY): Payer: Self-pay

## 2022-12-03 ENCOUNTER — Other Ambulatory Visit: Payer: Self-pay

## 2022-12-03 MED ORDER — ESZOPICLONE 2 MG PO TABS
2.0000 mg | ORAL_TABLET | Freq: Every evening | ORAL | 5 refills | Status: DC | PRN
Start: 2022-12-03 — End: 2023-05-31
  Filled 2022-12-03 – 2022-12-08 (×2): qty 30, 30d supply, fill #0
  Filled 2023-01-05: qty 30, 30d supply, fill #1
  Filled 2023-02-05: qty 30, 30d supply, fill #2
  Filled 2023-03-03 – 2023-03-05 (×2): qty 30, 30d supply, fill #3
  Filled 2023-04-01: qty 30, 30d supply, fill #4
  Filled 2023-05-04: qty 30, 30d supply, fill #5

## 2022-12-08 ENCOUNTER — Other Ambulatory Visit (HOSPITAL_COMMUNITY): Payer: Self-pay

## 2022-12-08 DIAGNOSIS — H524 Presbyopia: Secondary | ICD-10-CM | POA: Diagnosis not present

## 2022-12-09 ENCOUNTER — Other Ambulatory Visit (HOSPITAL_COMMUNITY): Payer: Self-pay

## 2022-12-10 DIAGNOSIS — G4733 Obstructive sleep apnea (adult) (pediatric): Secondary | ICD-10-CM | POA: Diagnosis not present

## 2022-12-15 DIAGNOSIS — G4733 Obstructive sleep apnea (adult) (pediatric): Secondary | ICD-10-CM | POA: Diagnosis not present

## 2022-12-19 ENCOUNTER — Other Ambulatory Visit: Payer: Self-pay

## 2022-12-31 NOTE — Telephone Encounter (Signed)
Results abstracted and Care Team updated

## 2023-01-05 ENCOUNTER — Other Ambulatory Visit (HOSPITAL_COMMUNITY): Payer: Self-pay

## 2023-01-05 ENCOUNTER — Other Ambulatory Visit: Payer: Self-pay | Admitting: Internal Medicine

## 2023-01-05 MED ORDER — CELECOXIB 200 MG PO CAPS
200.0000 mg | ORAL_CAPSULE | Freq: Two times a day (BID) | ORAL | 1 refills | Status: DC
  Filled 2023-01-05: qty 180, 90d supply, fill #0
  Filled 2023-04-12: qty 180, 90d supply, fill #1

## 2023-01-06 ENCOUNTER — Telehealth: Payer: Self-pay

## 2023-01-06 DIAGNOSIS — I10 Essential (primary) hypertension: Secondary | ICD-10-CM

## 2023-01-06 NOTE — Telephone Encounter (Signed)
Copied from CRM [id number]. Topic: Appointments - Appointment Cancel/Reschedule >> Jan 06, 2023  9:43 AM [place] A wrote: Patient wants to reschedule lab appointment for the 01/27/23 if available and also if he needs a UA performed and any other labs

## 2023-01-06 NOTE — Addendum Note (Signed)
Addended by: Sandy Salaam on: 01/06/2023 10:35 AM   Modules accepted: Orders

## 2023-01-06 NOTE — Telephone Encounter (Signed)
Spoke with pt and added a urine microalbumin.

## 2023-01-09 ENCOUNTER — Other Ambulatory Visit: Payer: Self-pay

## 2023-01-09 ENCOUNTER — Other Ambulatory Visit (HOSPITAL_COMMUNITY): Payer: Self-pay

## 2023-01-13 ENCOUNTER — Other Ambulatory Visit (HOSPITAL_COMMUNITY): Payer: Self-pay

## 2023-01-13 ENCOUNTER — Other Ambulatory Visit: Payer: Self-pay

## 2023-01-20 ENCOUNTER — Ambulatory Visit: Payer: Medicare Other | Admitting: Internal Medicine

## 2023-01-27 ENCOUNTER — Other Ambulatory Visit (INDEPENDENT_AMBULATORY_CARE_PROVIDER_SITE_OTHER): Payer: Medicare Other

## 2023-01-27 DIAGNOSIS — I152 Hypertension secondary to endocrine disorders: Secondary | ICD-10-CM | POA: Diagnosis not present

## 2023-01-27 DIAGNOSIS — E669 Obesity, unspecified: Secondary | ICD-10-CM | POA: Diagnosis not present

## 2023-01-27 DIAGNOSIS — E559 Vitamin D deficiency, unspecified: Secondary | ICD-10-CM | POA: Diagnosis not present

## 2023-01-27 DIAGNOSIS — I10 Essential (primary) hypertension: Secondary | ICD-10-CM | POA: Diagnosis not present

## 2023-01-27 DIAGNOSIS — E1169 Type 2 diabetes mellitus with other specified complication: Secondary | ICD-10-CM | POA: Diagnosis not present

## 2023-01-27 DIAGNOSIS — E1159 Type 2 diabetes mellitus with other circulatory complications: Secondary | ICD-10-CM

## 2023-01-27 DIAGNOSIS — E785 Hyperlipidemia, unspecified: Secondary | ICD-10-CM | POA: Diagnosis not present

## 2023-01-27 LAB — COMPREHENSIVE METABOLIC PANEL
ALT: 46 U/L (ref 0–53)
AST: 28 U/L (ref 0–37)
Albumin: 3.8 g/dL (ref 3.5–5.2)
Alkaline Phosphatase: 72 U/L (ref 39–117)
BUN: 20 mg/dL (ref 6–23)
CO2: 25 meq/L (ref 19–32)
Calcium: 9.3 mg/dL (ref 8.4–10.5)
Chloride: 103 meq/L (ref 96–112)
Creatinine, Ser: 1.16 mg/dL (ref 0.40–1.50)
GFR: 65.47 mL/min (ref >60.00)
Glucose, Bld: 124 mg/dL — ABNORMAL HIGH (ref 70–99)
Potassium: 4.5 meq/L (ref 3.5–5.1)
Sodium: 138 meq/L (ref 135–145)
Total Bilirubin: 0.5 mg/dL (ref 0.2–1.2)
Total Protein: 6.3 g/dL (ref 6.0–8.3)

## 2023-01-27 LAB — VITAMIN D 25 HYDROXY (VIT D DEFICIENCY, FRACTURES): VITD: 27.99 ng/mL — ABNORMAL LOW (ref 30.00–100.00)

## 2023-01-27 LAB — MICROALBUMIN / CREATININE URINE RATIO
Creatinine,U: 104.4 mg/dL
Microalb Creat Ratio: 0.7 mg/g (ref 0.0–30.0)
Microalb, Ur: 0.8 mg/dL (ref 0.0–1.9)

## 2023-01-27 LAB — LIPID PANEL
Cholesterol: 115 mg/dL (ref 0–200)
HDL: 25.4 mg/dL — ABNORMAL LOW (ref >39.00)
LDL Cholesterol: 43 mg/dL (ref 0–99)
NonHDL: 89.25
Total CHOL/HDL Ratio: 5
Triglycerides: 232 mg/dL — ABNORMAL HIGH (ref 0.0–149.0)
VLDL: 46.4 mg/dL — ABNORMAL HIGH (ref 0.0–40.0)

## 2023-01-27 LAB — LDL CHOLESTEROL, DIRECT: Direct LDL: 65 mg/dL

## 2023-01-27 LAB — HEMOGLOBIN A1C: Hgb A1c MFr Bld: 7.4 % — ABNORMAL HIGH (ref 4.6–6.5)

## 2023-02-03 ENCOUNTER — Other Ambulatory Visit: Payer: Medicare Other

## 2023-02-05 ENCOUNTER — Other Ambulatory Visit (HOSPITAL_COMMUNITY): Payer: Self-pay

## 2023-02-10 ENCOUNTER — Other Ambulatory Visit: Payer: Self-pay

## 2023-02-10 ENCOUNTER — Ambulatory Visit (INDEPENDENT_AMBULATORY_CARE_PROVIDER_SITE_OTHER): Payer: Medicare Other | Admitting: Internal Medicine

## 2023-02-10 ENCOUNTER — Encounter: Payer: Self-pay | Admitting: Internal Medicine

## 2023-02-10 ENCOUNTER — Other Ambulatory Visit (HOSPITAL_COMMUNITY): Payer: Self-pay

## 2023-02-10 VITALS — BP 128/68 | HR 96 | Ht 70.0 in | Wt 297.2 lb

## 2023-02-10 DIAGNOSIS — E785 Hyperlipidemia, unspecified: Secondary | ICD-10-CM

## 2023-02-10 DIAGNOSIS — Z7984 Long term (current) use of oral hypoglycemic drugs: Secondary | ICD-10-CM

## 2023-02-10 DIAGNOSIS — E1169 Type 2 diabetes mellitus with other specified complication: Secondary | ICD-10-CM

## 2023-02-10 DIAGNOSIS — I152 Hypertension secondary to endocrine disorders: Secondary | ICD-10-CM

## 2023-02-10 DIAGNOSIS — E669 Obesity, unspecified: Secondary | ICD-10-CM | POA: Diagnosis not present

## 2023-02-10 DIAGNOSIS — E1159 Type 2 diabetes mellitus with other circulatory complications: Secondary | ICD-10-CM | POA: Diagnosis not present

## 2023-02-10 DIAGNOSIS — Z7985 Long-term (current) use of injectable non-insulin antidiabetic drugs: Secondary | ICD-10-CM

## 2023-02-10 MED ORDER — GLIMEPIRIDE 1 MG PO TABS
1.0000 mg | ORAL_TABLET | Freq: Two times a day (BID) | ORAL | 1 refills | Status: DC
  Filled 2023-02-10 – 2023-04-12 (×2): qty 180, 90d supply, fill #0
  Filled 2023-07-07: qty 180, 90d supply, fill #1

## 2023-02-10 MED ORDER — METFORMIN HCL 500 MG PO TABS
ORAL_TABLET | ORAL | 3 refills | Status: AC
  Filled 2023-02-10: qty 450, 90d supply, fill #0
  Filled 2023-08-25: qty 450, 90d supply, fill #1
  Filled 2023-11-18: qty 450, 90d supply, fill #2

## 2023-02-10 MED ORDER — EMPAGLIFLOZIN 25 MG PO TABS
25.0000 mg | ORAL_TABLET | Freq: Every day | ORAL | 1 refills | Status: DC
  Filled 2023-02-10 – 2023-03-03 (×2): qty 90, 90d supply, fill #0
  Filled 2023-05-31: qty 90, 90d supply, fill #1

## 2023-02-10 MED ORDER — OZEMPIC (2 MG/DOSE) 8 MG/3ML ~~LOC~~ SOPN
2.0000 mg | PEN_INJECTOR | SUBCUTANEOUS | 1 refills | Status: DC
  Filled 2023-02-10: qty 9, 84d supply, fill #0
  Filled 2023-03-14: qty 3, 28d supply, fill #0
  Filled 2023-04-09: qty 3, 28d supply, fill #1
  Filled 2023-05-14: qty 3, 28d supply, fill #2
  Filled 2023-06-13: qty 3, 28d supply, fill #3
  Filled 2023-07-08: qty 3, 28d supply, fill #4
  Filled 2023-08-05: qty 3, 28d supply, fill #5

## 2023-02-10 MED ORDER — ROSUVASTATIN CALCIUM 10 MG PO TABS
10.0000 mg | ORAL_TABLET | ORAL | 1 refills | Status: DC
  Filled 2023-02-10 – 2023-05-31 (×2): qty 45, 90d supply, fill #0
  Filled 2023-08-25: qty 45, 90d supply, fill #1

## 2023-02-10 MED ORDER — TERAZOSIN HCL 5 MG PO CAPS
5.0000 mg | ORAL_CAPSULE | Freq: Every day | ORAL | 3 refills | Status: AC
  Filled 2023-02-10: qty 90, fill #0
  Filled 2023-02-11 – 2023-04-06 (×2): qty 90, 90d supply, fill #0
  Filled 2023-06-30: qty 90, 90d supply, fill #1
  Filled 2023-09-28: qty 90, 90d supply, fill #2
  Filled 2023-12-24: qty 90, 90d supply, fill #3

## 2023-02-10 MED ORDER — CEPHALEXIN 500 MG PO CAPS
500.0000 mg | ORAL_CAPSULE | Freq: Four times a day (QID) | ORAL | 0 refills | Status: DC
  Filled 2023-02-10: qty 28, 7d supply, fill #0

## 2023-02-10 NOTE — Progress Notes (Signed)
Subjective:  Patient ID: KYIAN OBST, male    DOB: May 05, 1955  Age: 68 y.o. MRN: [id number]  CC: The primary encounter diagnosis was Obesity, diabetes, and hypertension syndrome (HCC). A diagnosis of Hyperlipidemia LDL goal <100 was also pertinent to this visit.   HPI EVAAN TIDWELL presents for  Chief Complaint  Patient presents with   Medical Management of Chronic Issues   Type 2DM:  He feels generally well, is  checking blood sugars once daily at variable times.  BS have been under 130 fasting and < 150 post prandially.  Denies any recent hypoglyemic events.  Taking his medications as directed. Following a carbohydrate modified diet 6 days per week. Denies numbness, burning and tingling of extremities. Appetite is good. Recently strained his hip after resuming golf  game,  spent several weeks waiting for it to heal.    Has a new puppy, a few scratches on his arm from her nails and teeth   2) Hypertension: patient has resumed his previous regimen and has not had any more hypotensive episodes.  3) obesity : He is taking maximal dose of Ozempic,  weight has been unchanged   Lab Results  Component Value Date   HGBA1C 7.4 (H) 01/27/2023    Lab Results  Component Value Date   PSA 0.31 07/04/2022   PSA 0.41 12/24/2020   PSA 0.33 12/05/2019      Outpatient Medications Prior to Visit  Medication Sig Dispense Refill   allopurinol (ZYLOPRIM) 300 MG tablet Take 1 tablet (300 mg total) by mouth daily. 90 tablet 3   amLODipine (NORVASC) 10 MG tablet Take 1 tablet (10 mg total) by mouth daily. 90 tablet 1   aspirin 81 MG tablet Take 81 mg by mouth every other day.     blood glucose meter kit and supplies Use up to four times daily as directed 1 each 0   celecoxib (CELEBREX) 200 MG capsule Take 1 capsule (200 mg total) by mouth 2 (two) times daily. 180 capsule 1   ergocalciferol (DRISDOL) 1.25 MG (50000 UT) capsule Take 1 capsule (50,000 Units total) by mouth once a week. 12  capsule 0   eszopiclone (LUNESTA) 2 MG TABS tablet Take 1 tablet (2 mg total) by mouth immediately before bedtime as needed for sleep. 30 tablet 5   glucose blood (ONETOUCH ULTRA TEST) test strip Use 1 each to check blood sugar up to 4 (four) times daily as directed. 100 each 1   metoprolol succinate (TOPROL-XL) 25 MG 24 hr tablet Take 1 tablet (25 mg total) by mouth daily. 90 tablet 3   OneTouch Delica Lancets 33G MISC Use 1 each to check blood sugar up to 4 (four) times daily as directed 100 each 0   pantoprazole (PROTONIX) 40 MG tablet Take 1 tablet (40 mg total) by mouth daily. 90 tablet 3   telmisartan (MICARDIS) 80 MG tablet Take 1 tablet (80 mg total) by mouth daily. 90 tablet 1   empagliflozin (JARDIANCE) 25 MG TABS tablet Take 1 tablet (25 mg total) by mouth at bedtime. 90 tablet 1   glimepiride (AMARYL) 1 MG tablet Take 1 tablet (1 mg total) by mouth 2 (two) times daily. 180 tablet 1   metFORMIN (GLUCOPHAGE) 500 MG tablet Take 2 tablets (1,000 mg total) by mouth 2 (two) times daily AND 1 tablet (500 mg total) daily with lunch. 450 tablet 3   rosuvastatin (CRESTOR) 10 MG tablet Take 1 tablet (10 mg total) by mouth every  other day. 45 tablet 1   Semaglutide, 2 MG/DOSE, (OZEMPIC, 2 MG/DOSE,) 8 MG/3ML SOPN Inject 2 mg into the skin once a week. 9 mL 1   terazosin (HYTRIN) 5 MG capsule TAKE 1 CAPSULE BY MOUTH AT BEDTIME 90 capsule 3   AMBULATORY NON FORMULARY MEDICATION Place 30 g rectally 2 (two) times daily. Medication Name: Diltiazem 2% / lidocaine 5%  Cream 50/50 mix  Insert into anal canal twice daily 30 g 3   Facility-Administered Medications Prior to Visit  Medication Dose Route Frequency Provider Last Rate Last Admin   0.9 %  sodium chloride infusion  500 mL Intravenous Once Iva Boop, MD        Review of Systems;  Patient denies headache, fevers, malaise, unintentional weight loss, skin rash, eye pain, sinus congestion and sinus pain, sore throat, dysphagia,  hemoptysis ,  cough, dyspnea, wheezing, chest pain, palpitations, orthopnea, edema, abdominal pain, nausea, melena, diarrhea, constipation, flank pain, dysuria, hematuria, urinary  Frequency, nocturia, numbness, tingling, seizures,  Focal weakness, Loss of consciousness,  Tremor, insomnia, depression, anxiety, and suicidal ideation.      Objective:  BP 128/68   Pulse 96   Ht 5\' 10"  (1.778 m)   Wt 297 lb 3.2 oz (134.8 kg)   SpO2 96%   BMI 42.64 kg/m   BP Readings from Last 3 Encounters:  02/10/23 128/68  09/23/22 130/66  07/17/22 130/76    Wt Readings from Last 3 Encounters:  02/10/23 297 lb 3.2 oz (134.8 kg)  09/23/22 293 lb 6.4 oz (133.1 kg)  07/15/22 287 lb 6.4 oz (130.4 kg)    Physical Exam  Lab Results  Component Value Date   HGBA1C 7.4 (H) 01/27/2023   HGBA1C 6.4 07/04/2022   HGBA1C 7.6 (H) 12/26/2021    Lab Results  Component Value Date   CREATININE 1.16 01/27/2023   CREATININE 1.06 07/04/2022   CREATININE 1.09 12/26/2021    Lab Results  Component Value Date   WBC 6.8 07/04/2022   HGB 14.0 07/04/2022   HCT 43.3 07/04/2022   PLT 316.0 07/04/2022   GLUCOSE 124 (H) 01/27/2023   CHOL 115 01/27/2023   TRIG 232.0 (H) 01/27/2023   HDL 25.40 (L) 01/27/2023   LDLDIRECT 65.0 01/27/2023   LDLCALC 43 01/27/2023   ALT 46 01/27/2023   AST 28 01/27/2023   NA 138 01/27/2023   K 4.5 01/27/2023   CL 103 01/27/2023   CREATININE 1.16 01/27/2023   BUN 20 01/27/2023   CO2 25 01/27/2023   TSH 3.12 07/04/2022   PSA 0.31 07/04/2022   HGBA1C 7.4 (H) 01/27/2023   MICROALBUR 0.8 01/27/2023    No results found.  Assessment & Plan:  .Obesity, diabetes, and hypertension syndrome (HCC) Assessment & Plan: He is tolerating an increased dose of glimepiride to 2 mg daily and the  increased dose of  Ozempic , but A1c hs risen slightly,  advised to   continue statin and ARB and encouraged to resume walking . Repeat a1cin 3 months and consider change in therapy to [facility] if no improvement  is seen   Lab Results  Component Value Date   HGBA1C 7.4 (H) 01/27/2023   Lab Results  Component Value Date   CREATININE 1.16 01/27/2023   Lab Results  Component Value Date   MICROALBUR 0.8 01/27/2023   MICROALBUR 1.7 12/26/2021       Orders: -     Comprehensive metabolic panel; Future -     Hemoglobin A1c; Future  Hyperlipidemia LDL goal <100 -     Lipid panel; Future -     LDL cholesterol, direct; Future  Other orders -     Empagliflozin; Take 1 tablet (25 mg total) by mouth at bedtime.  Dispense: 90 tablet; Refill: 1 -     metFORMIN HCl; Take 2 tablets (1,000 mg total) by mouth 2 (two) times daily AND 1 tablet (500 mg total) daily with lunch.  Dispense: 450 tablet; Refill: 3 -     Rosuvastatin Calcium; Take 1 tablet (10 mg total) by mouth every other day.  Dispense: 45 tablet; Refill: 1 -     Ozempic (2 MG/DOSE); Inject 2 mg into the skin once a week.  Dispense: 9 mL; Refill: 1 -     Terazosin HCl; Take 1 capsule (5 mg total) by mouth at bedtime.  Dispense: 90 capsule; Refill: 3 -     Glimepiride; Take 1 tablet (1 mg total) by mouth 2 (two) times daily.  Dispense: 180 tablet; Refill: 1 -     Cephalexin; Take 1 capsule (500 mg total) by mouth 4 (four) times daily.  Dispense: 28 capsule; Refill: 0     I provided 30 minutes of face-to-face time during this encounter reviewing patient's last visit with me,  previous  labs and imaging studies, counseling on weight management and glycemic control ,  and post visit ordering to diagnostics and therapeutics .   Follow-up: Return in about 6 months (around 08/10/2023) for physical.   Sherlene Shams, MD

## 2023-02-10 NOTE — Patient Instructions (Signed)
Congratulations on Christopher Stewart !   No changes today  Let;s's  repeat the lipids and A1c in 3 months.  If A1c is not closer to 7 we can try changing ozempic to mounjaro     Follow up with me in 6

## 2023-02-10 NOTE — Assessment & Plan Note (Addendum)
He is tolerating an increased dose of glimepiride to 2 mg daily and the  increased dose of  Ozempic , but A1c hs risen slightly,  advised to   continue statin and ARB and encouraged to resume walking . Repeat a1cin 3 months and consider change in therapy to [facility] if no improvement is seen   Lab Results  Component Value Date   HGBA1C 7.4 (H) 01/27/2023   Lab Results  Component Value Date   CREATININE 1.16 01/27/2023   Lab Results  Component Value Date   MICROALBUR 0.8 01/27/2023   MICROALBUR 1.7 12/26/2021

## 2023-02-11 ENCOUNTER — Other Ambulatory Visit (HOSPITAL_COMMUNITY): Payer: Self-pay

## 2023-02-11 ENCOUNTER — Other Ambulatory Visit: Payer: Self-pay | Admitting: Internal Medicine

## 2023-02-11 ENCOUNTER — Other Ambulatory Visit: Payer: Self-pay

## 2023-02-13 ENCOUNTER — Other Ambulatory Visit: Payer: Self-pay | Admitting: Internal Medicine

## 2023-02-13 ENCOUNTER — Other Ambulatory Visit (HOSPITAL_COMMUNITY): Payer: Self-pay

## 2023-02-16 ENCOUNTER — Other Ambulatory Visit: Payer: Self-pay | Admitting: Internal Medicine

## 2023-02-16 ENCOUNTER — Other Ambulatory Visit (HOSPITAL_COMMUNITY): Payer: Self-pay

## 2023-02-16 MED ORDER — ERGOCALCIFEROL 1.25 MG (50000 UT) PO CAPS
50000.0000 [IU] | ORAL_CAPSULE | ORAL | 0 refills | Status: DC
  Filled 2023-02-16: qty 12, 84d supply, fill #0

## 2023-02-23 ENCOUNTER — Telehealth: Payer: Medicare Other | Admitting: Physician Assistant

## 2023-02-23 DIAGNOSIS — M545 Low back pain, unspecified: Secondary | ICD-10-CM

## 2023-02-23 MED ORDER — METHOCARBAMOL 750 MG PO TABS: 750.0000 mg | ORAL_TABLET | Freq: Four times a day (QID) | ORAL | 0 refills | Status: DC

## 2023-02-23 NOTE — Progress Notes (Signed)
 E-Visit for Back Pain   We are sorry that you are not feeling well.  Here is how we plan to help!  Based on what you have shared with me it looks like you mostly have acute back pain.  Acute back pain is defined as musculoskeletal pain that can resolve in 1-3 weeks with conservative treatment.  I have prescribed Robaxin  as this has provided relief in the past.  Recommend Ibuprofen or Tylenol  as needed. Some patients experience stomach irritation or in increased heartburn with anti-inflammatory drugs.  Please keep in mind that muscle relaxer's can cause fatigue and should not be taken while at work or driving.  Back pain is very common.  The pain often gets better over time.  The cause of back pain is usually not dangerous.  Most people can learn to manage their back pain on their own.  Home Care Stay active.  Start with short walks on flat ground if you can.  Try to walk farther each day. Do not sit, drive or stand in one place for more than 30 minutes.  Do not stay in bed. Do not avoid exercise or work.  Activity can help your back heal faster. Be careful when you bend or lift an object.  Bend at your knees, keep the object close to you, and do not twist. Sleep on a firm mattress.  Lie on your side, and bend your knees.  If you lie on your back, put a pillow under your knees. Only take medicines as told by your doctor. Put ice on the injured area. Put ice in a plastic bag Place a towel between your skin and the bag Leave the ice on for 15-20 minutes, 3-4 times a day for the first 2-3 days. 210 After that, you can switch between ice and heat packs. Ask your doctor about back exercises or massage. Avoid feeling anxious or stressed.  Find good ways to deal with stress, such as exercise.  Get Help Right Way If: Your pain does not go away with rest or medicine. Your pain does not go away in 1 week. You have new problems. You do not feel well. The pain spreads into your legs. You cannot  control when you poop (bowel movement) or pee (urinate) You feel sick to your stomach (nauseous) or throw up (vomit) You have belly (abdominal) pain. You feel like you may pass out (faint). If you develop a fever.  Make Sure you: Understand these instructions. Will watch your condition Will get help right away if you are not doing well or get worse.  Your e-visit answers were reviewed by a board certified advanced clinical practitioner to complete your personal care plan.  Depending on the condition, your plan could have included both over the counter or prescription medications.  If there is a problem please reply  once you have received a response from your provider.  Your safety is important to us .  If you have drug allergies check your prescription carefully.    You can use MyChart to ask questions about today's visit, request a non-urgent call back, or ask for a work or school excuse for 24 hours related to this e-Visit. If it has been greater than 24 hours you will need to follow up with your provider, or enter a new e-Visit to address those concerns.  You will get an e-mail in the next two days asking about your experience.  I hope that your e-visit has been valuable and will speed  your recovery. Thank you for using e-visits.   I have spent 5 minutes in review of e-visit questionnaire, review and updating patient chart, medical decision making and response to patient.   Char Common Ward, PA-C

## 2023-02-24 ENCOUNTER — Other Ambulatory Visit (HOSPITAL_COMMUNITY): Payer: Self-pay

## 2023-03-03 ENCOUNTER — Encounter (HOSPITAL_COMMUNITY): Payer: Self-pay

## 2023-03-03 ENCOUNTER — Other Ambulatory Visit: Payer: Self-pay

## 2023-03-03 ENCOUNTER — Other Ambulatory Visit (HOSPITAL_COMMUNITY): Payer: Self-pay

## 2023-03-05 ENCOUNTER — Encounter: Payer: Self-pay | Admitting: Internal Medicine

## 2023-03-05 ENCOUNTER — Other Ambulatory Visit: Payer: Self-pay

## 2023-03-05 ENCOUNTER — Other Ambulatory Visit (HOSPITAL_COMMUNITY): Payer: Self-pay

## 2023-03-05 NOTE — Addendum Note (Signed)
Addended by: Sherlene Shams on: 03/05/2023 07:05 PM   Modules accepted: Orders

## 2023-03-14 ENCOUNTER — Other Ambulatory Visit (HOSPITAL_COMMUNITY): Payer: Self-pay

## 2023-03-17 ENCOUNTER — Other Ambulatory Visit (HOSPITAL_COMMUNITY): Payer: Self-pay

## 2023-03-23 DIAGNOSIS — G4733 Obstructive sleep apnea (adult) (pediatric): Secondary | ICD-10-CM | POA: Diagnosis not present

## 2023-04-02 ENCOUNTER — Other Ambulatory Visit (HOSPITAL_COMMUNITY): Payer: Self-pay

## 2023-04-06 ENCOUNTER — Other Ambulatory Visit (HOSPITAL_COMMUNITY): Payer: Self-pay

## 2023-04-06 ENCOUNTER — Other Ambulatory Visit: Payer: Self-pay

## 2023-04-09 ENCOUNTER — Other Ambulatory Visit (HOSPITAL_COMMUNITY): Payer: Self-pay

## 2023-04-13 ENCOUNTER — Other Ambulatory Visit: Payer: Self-pay

## 2023-04-13 ENCOUNTER — Other Ambulatory Visit (HOSPITAL_COMMUNITY): Payer: Self-pay

## 2023-04-23 DIAGNOSIS — G4733 Obstructive sleep apnea (adult) (pediatric): Secondary | ICD-10-CM | POA: Diagnosis not present

## 2023-05-04 ENCOUNTER — Other Ambulatory Visit: Payer: Self-pay | Admitting: Internal Medicine

## 2023-05-04 ENCOUNTER — Other Ambulatory Visit (HOSPITAL_COMMUNITY): Payer: Self-pay

## 2023-05-04 MED ORDER — ERGOCALCIFEROL 1.25 MG (50000 UT) PO CAPS
50000.0000 [IU] | ORAL_CAPSULE | ORAL | 0 refills | Status: DC
Start: 2023-05-04 — End: 2023-07-22
  Filled 2023-05-04: qty 12, 84d supply, fill #0

## 2023-05-04 MED ORDER — ONETOUCH DELICA LANCETS 33G MISC
1.0000 | Freq: Four times a day (QID) | 0 refills | Status: AC
  Filled 2023-05-04: qty 100, 25d supply, fill #0

## 2023-05-05 ENCOUNTER — Other Ambulatory Visit: Payer: Self-pay

## 2023-05-05 ENCOUNTER — Other Ambulatory Visit (HOSPITAL_COMMUNITY): Payer: Self-pay

## 2023-05-06 ENCOUNTER — Other Ambulatory Visit (INDEPENDENT_AMBULATORY_CARE_PROVIDER_SITE_OTHER)

## 2023-05-06 DIAGNOSIS — E1159 Type 2 diabetes mellitus with other circulatory complications: Secondary | ICD-10-CM | POA: Diagnosis not present

## 2023-05-06 DIAGNOSIS — E669 Obesity, unspecified: Secondary | ICD-10-CM | POA: Diagnosis not present

## 2023-05-06 DIAGNOSIS — E1169 Type 2 diabetes mellitus with other specified complication: Secondary | ICD-10-CM | POA: Diagnosis not present

## 2023-05-06 DIAGNOSIS — I152 Hypertension secondary to endocrine disorders: Secondary | ICD-10-CM

## 2023-05-06 LAB — HEMOGLOBIN A1C: Hgb A1c MFr Bld: 6.7 % — ABNORMAL HIGH (ref 4.6–6.5)

## 2023-05-06 NOTE — Addendum Note (Signed)
 Addended by: Thressa Flora D on: 05/06/2023 01:51 PM   Modules accepted: Orders

## 2023-05-07 ENCOUNTER — Other Ambulatory Visit

## 2023-05-08 ENCOUNTER — Encounter: Payer: Self-pay | Admitting: Internal Medicine

## 2023-05-08 DIAGNOSIS — E538 Deficiency of other specified B group vitamins: Secondary | ICD-10-CM

## 2023-05-08 DIAGNOSIS — Z125 Encounter for screening for malignant neoplasm of prostate: Secondary | ICD-10-CM

## 2023-05-08 DIAGNOSIS — E669 Obesity, unspecified: Secondary | ICD-10-CM

## 2023-05-08 DIAGNOSIS — Z79899 Other long term (current) drug therapy: Secondary | ICD-10-CM

## 2023-05-08 DIAGNOSIS — E559 Vitamin D deficiency, unspecified: Secondary | ICD-10-CM

## 2023-05-08 DIAGNOSIS — E785 Hyperlipidemia, unspecified: Secondary | ICD-10-CM

## 2023-05-08 NOTE — Telephone Encounter (Signed)
 I have pended orders for your approval.

## 2023-05-14 ENCOUNTER — Other Ambulatory Visit (HOSPITAL_COMMUNITY): Payer: Self-pay

## 2023-05-19 ENCOUNTER — Other Ambulatory Visit: Payer: Self-pay

## 2023-05-19 ENCOUNTER — Other Ambulatory Visit: Payer: Self-pay | Admitting: Internal Medicine

## 2023-05-19 ENCOUNTER — Other Ambulatory Visit (HOSPITAL_COMMUNITY): Payer: Self-pay

## 2023-05-19 MED ORDER — AMLODIPINE BESYLATE 10 MG PO TABS
10.0000 mg | ORAL_TABLET | Freq: Every day | ORAL | 1 refills | Status: DC
  Filled 2023-05-19: qty 90, 90d supply, fill #0
  Filled 2023-08-19: qty 90, 90d supply, fill #1

## 2023-05-23 DIAGNOSIS — G4733 Obstructive sleep apnea (adult) (pediatric): Secondary | ICD-10-CM | POA: Diagnosis not present

## 2023-05-31 ENCOUNTER — Other Ambulatory Visit: Payer: Self-pay | Admitting: Internal Medicine

## 2023-06-01 ENCOUNTER — Other Ambulatory Visit (HOSPITAL_COMMUNITY): Payer: Self-pay

## 2023-06-02 ENCOUNTER — Other Ambulatory Visit: Payer: Self-pay

## 2023-06-02 ENCOUNTER — Other Ambulatory Visit (HOSPITAL_COMMUNITY): Payer: Self-pay

## 2023-06-02 MED ORDER — ESZOPICLONE 2 MG PO TABS
2.0000 mg | ORAL_TABLET | Freq: Every evening | ORAL | 5 refills | Status: DC | PRN
  Filled 2023-06-02: qty 30, 30d supply, fill #0
  Filled 2023-06-30: qty 30, 30d supply, fill #1
  Filled 2023-08-05: qty 30, 30d supply, fill #2
  Filled 2023-09-02: qty 30, 30d supply, fill #3
  Filled 2023-09-28 – 2023-09-30 (×2): qty 30, 30d supply, fill #4
  Filled 2023-10-29: qty 30, 30d supply, fill #5

## 2023-06-13 ENCOUNTER — Other Ambulatory Visit: Payer: Self-pay | Admitting: Internal Medicine

## 2023-06-13 ENCOUNTER — Other Ambulatory Visit (HOSPITAL_COMMUNITY): Payer: Self-pay

## 2023-06-15 ENCOUNTER — Other Ambulatory Visit: Payer: Self-pay

## 2023-06-16 ENCOUNTER — Other Ambulatory Visit: Payer: Self-pay

## 2023-06-16 ENCOUNTER — Other Ambulatory Visit (HOSPITAL_COMMUNITY): Payer: Self-pay

## 2023-06-16 MED ORDER — TELMISARTAN 80 MG PO TABS
80.0000 mg | ORAL_TABLET | Freq: Every day | ORAL | 1 refills | Status: DC
  Filled 2023-06-16: qty 90, 90d supply, fill #0
  Filled 2023-09-13: qty 90, 90d supply, fill #1

## 2023-06-30 ENCOUNTER — Ambulatory Visit
Admission: RE | Admit: 2023-06-30 | Discharge: 2023-06-30 | Disposition: A | Discharge status: Home / Self Care | Attending: Internal Medicine | Admitting: Internal Medicine

## 2023-06-30 ENCOUNTER — Ambulatory Visit
Admission: RE | Admit: 2023-06-30 | Discharge: 2023-06-30 | Disposition: A | Discharge status: Home / Self Care | Source: Ambulatory Visit | Attending: Internal Medicine | Admitting: Internal Medicine

## 2023-06-30 ENCOUNTER — Other Ambulatory Visit: Payer: Self-pay | Admitting: Internal Medicine

## 2023-06-30 ENCOUNTER — Other Ambulatory Visit: Payer: Self-pay

## 2023-06-30 DIAGNOSIS — R058 Other specified cough: Secondary | ICD-10-CM | POA: Insufficient documentation

## 2023-06-30 DIAGNOSIS — R059 Cough, unspecified: Secondary | ICD-10-CM | POA: Diagnosis not present

## 2023-06-30 DIAGNOSIS — R509 Fever, unspecified: Secondary | ICD-10-CM | POA: Diagnosis not present

## 2023-07-01 ENCOUNTER — Other Ambulatory Visit: Payer: Self-pay

## 2023-07-01 ENCOUNTER — Ambulatory Visit (INDEPENDENT_AMBULATORY_CARE_PROVIDER_SITE_OTHER)

## 2023-07-01 VITALS — BP 118/58 | HR 82 | Temp 98.3°F | Wt 295.8 lb

## 2023-07-01 DIAGNOSIS — J189 Pneumonia, unspecified organism: Secondary | ICD-10-CM | POA: Insufficient documentation

## 2023-07-01 DIAGNOSIS — H6992 Unspecified Eustachian tube disorder, left ear: Secondary | ICD-10-CM | POA: Diagnosis not present

## 2023-07-01 MED ORDER — DOXYCYCLINE HYCLATE 100 MG PO TABS: 100.0000 mg | ORAL_TABLET | Freq: Two times a day (BID) | ORAL | 0 refills | Status: AC

## 2023-07-01 MED ORDER — FLUTICASONE PROPIONATE 50 MCG/ACT NA SUSP
2.0000 | Freq: Every day | NASAL | 0 refills | Status: DC
Start: 2023-07-01 — End: 2023-08-26

## 2023-07-01 MED ORDER — METOPROLOL SUCCINATE ER 25 MG PO TB24
25.0000 mg | ORAL_TABLET | Freq: Every day | ORAL | 3 refills | Status: AC
  Filled 2023-07-01: qty 90, 90d supply, fill #0
  Filled 2023-09-28: qty 90, 90d supply, fill #1
  Filled 2023-12-24: qty 90, 90d supply, fill #2

## 2023-07-01 NOTE — Assessment & Plan Note (Signed)
 Nasal flonase, 2 puffs daily for next 7-10 days then prn.  Nasal saline rinses 2-3 times a day.  OTC antihistamine like Allegra daily to help with mucous production.  Blood pressure from today's visit reviewed. Okay to take OTC Guaifenesin/Dextromethorphan cough medication.

## 2023-07-01 NOTE — Patient Instructions (Addendum)
-   Nasal Flonase 2 puffs daily for next 7- 10 days. Daily saline nasal rinses 2-3 times can help with reducing nasal secretion. Take Doxycycline 100 mg, twice a day for 7 days. Take it with tall glass of water and do not lay down for 30 min after taking medication. You can take over the counter probiotic 2 hr before or after taking antibiotic to reduce gastrointestinal side effects like diarrhea.

## 2023-07-01 NOTE — Assessment & Plan Note (Addendum)
 Chest x-ray from 06/30/23 reviewed personally. Has not been read by radiologist yet.  Chest x-ray shows: - Partially obscured b/l costophrenic angles, left worse than right.  - Patchy opacities b/l, more prominent in right base and middle lobes.   Suggestive of pneumonia involving bibasilar, right middle lobes. Recommend: - Doxycycline 100 Mg BID for 7 days.  Antibiotic s/e discussed. If patient does not feel better in next 5-7 days recommend reevaluation.  - Recommend updating pneumonia immunization once patient feels better.

## 2023-07-01 NOTE — Progress Notes (Signed)
 Acute Office Visit  Subjective:    Patient ID: Christopher Stewart, male    DOB: 09-Dec-1955, 68 y.o.   MRN: [id number]  Chief Complaint  Patient presents with   Cough   Nasal Congestion   Patient is in today for following acute concern:  - Productive cough started on 06/27/23, green/yellow in color. No blood/brown sputum. Followed by nasal congestion, sneezing for 2 days. Also has b/l ear fullness. Patient also endorses low grade fever, overall not felling well. No chest pain, lower leg edema. No h/o asthma, COPD, smoking. No chest pain, swelling in legs, no sick exposure, no recent travel. Denies sore throat. Due for pneumonia immunization.  Patient had chest x-ray done on 06/30/23, ordered by his PCP Dr. Madelon Scheuermann.    ROS: As per HPI    Objective:    BP (!) 118/58   Pulse 82   Temp 98.3 F (36.8 C) (Oral)   Wt 295 lb 12.8 oz (134.2 kg)   SpO2 94%   BMI 42.44 kg/m    Physical Exam HENT:     Right Ear: Tympanic membrane is not perforated, erythematous or bulging.     Left Ear: Tympanic membrane is bulging. Tympanic membrane is not erythematous.     Nose:     Right Sinus: No maxillary sinus tenderness or frontal sinus tenderness.     Left Sinus: No maxillary sinus tenderness or frontal sinus tenderness.   Cardiovascular:     Rate and Rhythm: Normal rate.  Pulmonary:     Effort: No tachypnea or accessory muscle usage.     Breath sounds: No wheezing, rhonchi or rales.     Comments: Egophony +on right lower base.   Musculoskeletal:     Cervical back: No rigidity.  Lymphadenopathy:     Cervical: No cervical adenopathy.   Neurological:     Mental Status: He is alert.     No results found for any visits on 07/01/23.     Assessment & Plan:  Community acquired bilateral lower lobe pneumonia Assessment & Plan: Chest x-ray from 06/30/23 reviewed personally. Has not been read by radiologist yet.  Chest x-ray shows: - Partially obscured b/l costophrenic angles, left worse than  right.  - Patchy opacities b/l, more prominent in right base and middle lobes.   Suggestive of pneumonia involving bibasilar, right middle lobes. Recommend: - Doxycycline 100 Mg BID for 7 days.  Antibiotic s/e discussed. If patient does not feel better in next 5-7 days recommend reevaluation.    Orders: -     Doxycycline Hyclate; Take 1 tablet (100 mg total) by mouth 2 (two) times daily for 7 days.  Dispense: 14 tablet; Refill: 0  Acute dysfunction of left eustachian tube Assessment & Plan: Nasal flonase, 2 puffs daily for next 7-10 days then prn.  Nasal saline rinses 2-3 times a day.  OTC antihistamine like Allegra daily to help with mucous production.  Blood pressure from today's visit reviewed. Okay to take OTC Guaifenesin/Dextromethorphan cough medication.    Orders: -     Fluticasone Propionate; Place 2 sprays into both nostrils daily.  Dispense: 18.2 mL; Refill: 0    Family History  Problem Relation Age of Onset   Heart disease Mother    Deep vein thrombosis Father    Colon cancer Neg Hx    Colon polyps Neg Hx    Esophageal cancer Neg Hx    Rectal cancer Neg Hx    Stomach cancer Neg Hx  Social History   Socioeconomic History   Marital status: Married    Spouse name: Not on file   Number of children: 0   Years of education: Not on file   Highest education level: Professional school degree (e.g., MD, DDS, DVM, JD)  Occupational History   Not on file  Tobacco Use   Smoking status: Never   Smokeless tobacco: Never  Substance and Sexual Activity   Alcohol use: No   Drug use: No   Sexual activity: Not on file  Other Topics Concern   Not on file  Social History Narrative   Not on file   Social Drivers of Health   Financial Resource Strain: Low Risk  (06/30/2023)   Overall Financial Resource Strain (CARDIA)    Difficulty of Paying Living Expenses: Not hard at all  Food Insecurity: No Food Insecurity (06/30/2023)   Hunger Vital Sign    Worried About Running  Out of Food in the Last Year: Never true    Ran Out of Food in the Last Year: Never true  Transportation Needs: No Transportation Needs (06/30/2023)   PRAPARE - Administrator, Civil Service (Medical): No    Lack of Transportation (Non-Medical): No  Physical Activity: Insufficiently Active (06/30/2023)   Exercise Vital Sign    Days of Exercise per Week: 5 days    Minutes of Exercise per Session: 20 min  Stress: No Stress Concern Present (06/30/2023)   [employer] of Occupational Health - Occupational Stress Questionnaire    Feeling of Stress: Only a little  Social Connections: Moderately Isolated (06/30/2023)   Social Connection and Isolation Panel    Frequency of Communication with Friends and Family: Three times a week    Frequency of Social Gatherings with Friends and Family: Never    Attends Religious Services: Never    Database administrator or Organizations: No    Attends Engineer, structural: Not on file    Marital Status: Married    Review of Systems:  Per HPI.    Objective:   Vitals:   07/01/23 1601  BP: (!) 118/58  Pulse: 82  Temp: 98.3 F (36.8 C)  SpO2: 94%   Physical Exam HENT:     Right Ear: Tympanic membrane is not perforated, erythematous or bulging.     Left Ear: Tympanic membrane is bulging. Tympanic membrane is not erythematous.     Nose:     Right Sinus: No maxillary sinus tenderness or frontal sinus tenderness.     Left Sinus: No maxillary sinus tenderness or frontal sinus tenderness.   Cardiovascular:     Rate and Rhythm: Normal rate.  Pulmonary:     Effort: No tachypnea or accessory muscle usage.     Breath sounds: No wheezing, rhonchi or rales.     Comments: Egophony +on right lower base.   Musculoskeletal:     Cervical back: No rigidity.  Lymphadenopathy:     Cervical: No cervical adenopathy.   Neurological:     Mental Status: He is alert.    Pelvic Exam:        External: normal male genitalia without  lesions or masses        Vagina: normal without lesions or masses        Cervix: normal without lesions or masses        Pap smear: performed        Samples for Wet prep, GC/Chlamydia obtained    Chemistry  Component Value Date/Time   NA 138 01/27/2023 0859   NA 140 11/27/2015 0000   K 4.5 01/27/2023 0859   CL 103 01/27/2023 0859   CO2 25 01/27/2023 0859   BUN 20 01/27/2023 0859   BUN 18 11/27/2015 0000   CREATININE 1.16 01/27/2023 0859   CREATININE 1.17 06/15/2020 1503   GLU 149 11/27/2015 0000      Component Value Date/Time   CALCIUM  9.3 01/27/2023 0859   ALKPHOS 72 01/27/2023 0859   AST 28 01/27/2023 0859   ALT 46 01/27/2023 0859   BILITOT 0.5 01/27/2023 0859      Lab Results  Component Value Date   WBC 6.8 07/04/2022   HGB 14.0 07/04/2022   HCT 43.3 07/04/2022   MCV 90.2 07/04/2022   PLT 316.0 07/04/2022   Lab Results  Component Value Date   TSH 3.12 07/04/2022   Lab Results  Component Value Date   HGBA1C 6.7 (H) 05/06/2023   Assessment/Plan:  Community acquired bilateral lower lobe pneumonia Assessment & Plan: Chest x-ray from 06/30/23 reviewed personally. Has not been read by radiologist yet.  Chest x-ray shows: - Partially obscured b/l costophrenic angles, left worse than right.  - Patchy opacities b/l, more prominent in right base and middle lobes.   Suggestive of pneumonia involving bibasilar, right middle lobes. Recommend: - Doxycycline 100 Mg BID for 7 days.  Antibiotic s/e discussed. If patient does not feel better in next 5-7 days recommend reevaluation.    Orders: -     Doxycycline Hyclate; Take 1 tablet (100 mg total) by mouth 2 (two) times daily for 7 days.  Dispense: 14 tablet; Refill: 0  Acute dysfunction of left eustachian tube Assessment & Plan: Nasal flonase, 2 puffs daily for next 7-10 days then prn.  Nasal saline rinses 2-3 times a day.  OTC antihistamine like Allegra daily to help with mucous production.  Blood pressure from  today's visit reviewed. Okay to take OTC Guaifenesin/Dextromethorphan cough medication.    Orders: -     Fluticasone Propionate; Place 2 sprays into both nostrils daily.  Dispense: 18.2 mL; Refill: 0  I spent 35 minutes on the day of this face-to-face encounter reviewing the patient's HPI, medical history, independently reviewing chest x-ray,  reviewing the assessment and plan with the patient.   Return if symptoms worsen or fail to improve.  Jacklin Mascot, MD

## 2023-07-07 ENCOUNTER — Other Ambulatory Visit: Payer: Self-pay | Admitting: Internal Medicine

## 2023-07-07 ENCOUNTER — Other Ambulatory Visit (HOSPITAL_COMMUNITY): Payer: Self-pay

## 2023-07-07 ENCOUNTER — Other Ambulatory Visit: Payer: Self-pay

## 2023-07-07 MED ORDER — CELECOXIB 200 MG PO CAPS
200.0000 mg | ORAL_CAPSULE | Freq: Two times a day (BID) | ORAL | 1 refills | Status: DC
  Filled 2023-07-07: qty 180, 90d supply, fill #0
  Filled 2023-10-05: qty 180, 90d supply, fill #1

## 2023-07-07 MED ORDER — PANTOPRAZOLE SODIUM 40 MG PO TBEC
40.0000 mg | DELAYED_RELEASE_TABLET | Freq: Every day | ORAL | 3 refills | Status: AC
  Filled 2023-07-07: qty 90, 90d supply, fill #0
  Filled 2023-10-05: qty 90, 90d supply, fill #1
  Filled 2024-01-04: qty 90, 90d supply, fill #2

## 2023-07-08 ENCOUNTER — Other Ambulatory Visit (HOSPITAL_COMMUNITY): Payer: Self-pay

## 2023-07-08 ENCOUNTER — Other Ambulatory Visit: Payer: Self-pay

## 2023-07-10 DIAGNOSIS — G4733 Obstructive sleep apnea (adult) (pediatric): Secondary | ICD-10-CM | POA: Diagnosis not present

## 2023-07-22 ENCOUNTER — Other Ambulatory Visit: Payer: Self-pay | Admitting: Internal Medicine

## 2023-07-22 DIAGNOSIS — E559 Vitamin D deficiency, unspecified: Secondary | ICD-10-CM

## 2023-07-24 ENCOUNTER — Other Ambulatory Visit: Payer: Self-pay

## 2023-07-24 ENCOUNTER — Encounter: Payer: Self-pay | Admitting: Pharmacist

## 2023-07-24 ENCOUNTER — Other Ambulatory Visit (HOSPITAL_COMMUNITY): Payer: Self-pay

## 2023-07-24 MED ORDER — ERGOCALCIFEROL 1.25 MG (50000 UT) PO CAPS
50000.0000 [IU] | ORAL_CAPSULE | ORAL | 0 refills | Status: DC
  Filled 2023-07-24: qty 12, 84d supply, fill #0

## 2023-08-05 ENCOUNTER — Other Ambulatory Visit: Payer: Self-pay | Admitting: Internal Medicine

## 2023-08-05 ENCOUNTER — Other Ambulatory Visit (HOSPITAL_COMMUNITY): Payer: Self-pay

## 2023-08-05 ENCOUNTER — Other Ambulatory Visit: Payer: Self-pay

## 2023-08-06 ENCOUNTER — Other Ambulatory Visit: Payer: Self-pay

## 2023-08-06 ENCOUNTER — Other Ambulatory Visit (HOSPITAL_COMMUNITY): Payer: Self-pay

## 2023-08-06 MED ORDER — ALLOPURINOL 300 MG PO TABS
300.0000 mg | ORAL_TABLET | Freq: Every day | ORAL | 3 refills | Status: AC
  Filled 2023-08-06: qty 90, 90d supply, fill #0
  Filled 2023-11-05: qty 90, 90d supply, fill #1
  Filled 2024-02-03: qty 90, 90d supply, fill #2

## 2023-08-07 ENCOUNTER — Other Ambulatory Visit

## 2023-08-17 ENCOUNTER — Encounter: Payer: Medicare Other | Admitting: Internal Medicine

## 2023-08-18 ENCOUNTER — Other Ambulatory Visit (INDEPENDENT_AMBULATORY_CARE_PROVIDER_SITE_OTHER)

## 2023-08-18 DIAGNOSIS — E559 Vitamin D deficiency, unspecified: Secondary | ICD-10-CM | POA: Diagnosis not present

## 2023-08-18 DIAGNOSIS — E669 Obesity, unspecified: Secondary | ICD-10-CM | POA: Diagnosis not present

## 2023-08-18 DIAGNOSIS — E1169 Type 2 diabetes mellitus with other specified complication: Secondary | ICD-10-CM

## 2023-08-18 DIAGNOSIS — I152 Hypertension secondary to endocrine disorders: Secondary | ICD-10-CM

## 2023-08-18 DIAGNOSIS — E538 Deficiency of other specified B group vitamins: Secondary | ICD-10-CM

## 2023-08-18 DIAGNOSIS — E1159 Type 2 diabetes mellitus with other circulatory complications: Secondary | ICD-10-CM | POA: Diagnosis not present

## 2023-08-18 DIAGNOSIS — E785 Hyperlipidemia, unspecified: Secondary | ICD-10-CM

## 2023-08-18 LAB — COMPREHENSIVE METABOLIC PANEL WITH GFR
ALT: 37 U/L (ref 0–53)
AST: 21 U/L (ref 0–37)
Albumin: 3.8 g/dL (ref 3.5–5.2)
Alkaline Phosphatase: 59 U/L (ref 39–117)
BUN: 20 mg/dL (ref 6–23)
CO2: 28 meq/L (ref 19–32)
Calcium: 9.6 mg/dL (ref 8.4–10.5)
Chloride: 105 meq/L (ref 96–112)
Creatinine, Ser: 1.05 mg/dL (ref 0.40–1.50)
GFR: 73.5 mL/min (ref >60.00)
Glucose, Bld: 111 mg/dL — ABNORMAL HIGH (ref 70–99)
Potassium: 4.3 meq/L (ref 3.5–5.1)
Sodium: 140 meq/L (ref 135–145)
Total Bilirubin: 0.5 mg/dL (ref 0.2–1.2)
Total Protein: 6.7 g/dL (ref 6.0–8.3)

## 2023-08-18 LAB — LIPID PANEL
Cholesterol: 116 mg/dL (ref 0–200)
HDL: 25.5 mg/dL — ABNORMAL LOW (ref >39.00)
LDL Cholesterol: 60 mg/dL (ref 0–99)
NonHDL: 90.27
Total CHOL/HDL Ratio: 5
Triglycerides: 153 mg/dL — ABNORMAL HIGH (ref 0.0–149.0)
VLDL: 30.6 mg/dL (ref 0.0–40.0)

## 2023-08-18 LAB — HEMOGLOBIN A1C: Hgb A1c MFr Bld: 7.3 % — ABNORMAL HIGH (ref 4.6–6.5)

## 2023-08-18 LAB — VITAMIN D 25 HYDROXY (VIT D DEFICIENCY, FRACTURES): VITD: 44.01 ng/mL (ref 30.00–100.00)

## 2023-08-18 LAB — LDL CHOLESTEROL, DIRECT: Direct LDL: 70 mg/dL

## 2023-08-18 LAB — VITAMIN B12: Vitamin B-12: 340 pg/mL (ref 211–911)

## 2023-08-19 ENCOUNTER — Other Ambulatory Visit (HOSPITAL_COMMUNITY): Payer: Self-pay

## 2023-08-21 ENCOUNTER — Ambulatory Visit: Payer: Self-pay | Admitting: Internal Medicine

## 2023-08-25 ENCOUNTER — Ambulatory Visit: Admitting: Internal Medicine

## 2023-08-25 ENCOUNTER — Other Ambulatory Visit (HOSPITAL_COMMUNITY): Payer: Self-pay

## 2023-08-26 ENCOUNTER — Ambulatory Visit: Admitting: Internal Medicine

## 2023-08-26 ENCOUNTER — Other Ambulatory Visit: Payer: Self-pay

## 2023-08-26 ENCOUNTER — Other Ambulatory Visit (HOSPITAL_COMMUNITY): Payer: Self-pay

## 2023-08-26 ENCOUNTER — Encounter: Payer: Self-pay | Admitting: Internal Medicine

## 2023-08-26 VITALS — BP 134/76 | HR 92 | Ht 70.0 in | Wt 294.4 lb

## 2023-08-26 DIAGNOSIS — I152 Hypertension secondary to endocrine disorders: Secondary | ICD-10-CM

## 2023-08-26 DIAGNOSIS — E1169 Type 2 diabetes mellitus with other specified complication: Secondary | ICD-10-CM | POA: Diagnosis not present

## 2023-08-26 DIAGNOSIS — Z23 Encounter for immunization: Secondary | ICD-10-CM

## 2023-08-26 DIAGNOSIS — F5101 Primary insomnia: Secondary | ICD-10-CM

## 2023-08-26 DIAGNOSIS — G4733 Obstructive sleep apnea (adult) (pediatric): Secondary | ICD-10-CM | POA: Diagnosis not present

## 2023-08-26 DIAGNOSIS — E559 Vitamin D deficiency, unspecified: Secondary | ICD-10-CM | POA: Diagnosis not present

## 2023-08-26 DIAGNOSIS — Z7985 Long-term (current) use of injectable non-insulin antidiabetic drugs: Secondary | ICD-10-CM

## 2023-08-26 DIAGNOSIS — Z6841 Body Mass Index (BMI) 40.0 and over, adult: Secondary | ICD-10-CM | POA: Diagnosis not present

## 2023-08-26 DIAGNOSIS — Z0000 Encounter for general adult medical examination without abnormal findings: Secondary | ICD-10-CM

## 2023-08-26 DIAGNOSIS — K760 Fatty (change of) liver, not elsewhere classified: Secondary | ICD-10-CM

## 2023-08-26 DIAGNOSIS — I10 Essential (primary) hypertension: Secondary | ICD-10-CM

## 2023-08-26 DIAGNOSIS — E1159 Type 2 diabetes mellitus with other circulatory complications: Secondary | ICD-10-CM

## 2023-08-26 MED ORDER — ERGOCALCIFEROL 1.25 MG (50000 UT) PO CAPS
50000.0000 [IU] | ORAL_CAPSULE | ORAL | 0 refills | Status: DC
  Filled 2023-08-26 – 2023-10-16 (×3): qty 12, 84d supply, fill #0

## 2023-08-26 MED ORDER — ROSUVASTATIN CALCIUM 10 MG PO TABS
10.0000 mg | ORAL_TABLET | ORAL | 1 refills | Status: AC
  Filled 2023-08-26 – 2023-11-24 (×3): qty 45, 90d supply, fill #0

## 2023-08-26 MED ORDER — GLIMEPIRIDE 1 MG PO TABS
1.0000 mg | ORAL_TABLET | Freq: Two times a day (BID) | ORAL | 1 refills | Status: AC
  Filled 2023-08-26 – 2023-10-05 (×2): qty 180, 90d supply, fill #0
  Filled 2024-01-04: qty 180, 90d supply, fill #1

## 2023-08-26 MED ORDER — AMLODIPINE BESYLATE 10 MG PO TABS
10.0000 mg | ORAL_TABLET | Freq: Every day | ORAL | 1 refills | Status: AC
  Filled 2023-08-26 – 2023-11-18 (×3): qty 90, 90d supply, fill #0
  Filled 2024-02-11: qty 90, 90d supply, fill #1

## 2023-08-26 MED ORDER — EMPAGLIFLOZIN 25 MG PO TABS
25.0000 mg | ORAL_TABLET | Freq: Every day | ORAL | 1 refills | Status: AC
  Filled 2023-08-26 (×2): qty 90, 90d supply, fill #0
  Filled 2023-11-24: qty 90, 90d supply, fill #1

## 2023-08-26 MED ORDER — OZEMPIC (2 MG/DOSE) 8 MG/3ML ~~LOC~~ SOPN
2.0000 mg | PEN_INJECTOR | SUBCUTANEOUS | 1 refills | Status: DC
  Filled 2023-08-26 – 2023-09-02 (×3): qty 9, 84d supply, fill #0
  Filled 2023-11-18: qty 9, 84d supply, fill #1

## 2023-08-26 NOTE — Assessment & Plan Note (Signed)
Diagnosed by sleep study in 2011.  He  is wearing his CPAP every night a minimum of 6 hours per night and notes improved daytime wakefulness and decreased fatigue  resulting from reduction in AHI to 1.2 /hr

## 2023-08-26 NOTE — Progress Notes (Signed)
 Patient ID: Christopher Stewart, male    DOB: [birth year]  Age: 68 y.o. MRN: [id number]  The patient is here for annual preventive examination and management of other chronic and acute problems.   The risk factors are reflected in the social history.   The roster of all physicians providing medical care to patient - is listed in the Snapshot section of the chart.   Activities of daily living:  The patient is 100% independent in all ADLs: dressing, toileting, feeding as well as independent mobility   Home safety : The patient has smoke detectors in the home. They wear seatbelts.  There are no unsecured firearms at home. There is no violence in the home.    There is no risks for hepatitis, STDs or HIV. There is no   history of blood transfusion. They have no travel history to infectious disease endemic areas of the world.   The patient has seen their dentist in the last six month. They have seen their eye doctor in the last year. The patinet  denies slight hearing difficulty with regard to whispered voices and some television programs.  They have deferred audiologic testing in the last year.  They do not  have excessive sun exposure. Discussed the need for sun protection: hats, long sleeves and use of sunscreen if there is significant sun exposure.    Diet: the importance of a healthy diet is discussed. He usually follows a Mediterranean diet,  but has been distracted this summer due to his wife's health issues. .   The benefits of regular aerobic exercise were discussed. The patient  exercises  3 to 5 days per week  for  60 minutes.    Depression screen: there are no signs or vegative symptoms of depression- irritability, change in appetite, anhedonia, sadness/tearfullness.   The following portions of the patient's history were reviewed and updated as appropriate: allergies, current medications, past family history, past medical history,  past surgical history, past social history  and problem list.    Visual acuity was not assessed per patient preference since the patient has regular follow up with an  ophthalmologist. Hearing and body mass index were assessed and reviewed.    During the course of the visit the patient was educated and counseled about appropriate screening and preventive services including : fall prevention , diabetes screening, nutrition counseling, colorectal cancer screening, and recommended immunizations.    Chief Complaint:   1) OSA:  Diagnosed by prior sleep study. he is continuing to wear  his CPAP every night a minimum of 6 hours per night and notes improved daytime wakefulness and decreased fatigue    2) type 2 DM :  he  feels generally well,  is walking most days for exercising and trying to lose weight. Fasting blood sugars have been < 130 consistently,  not Checking  blood sugars  post prandially .   Denies any recent hypoglyemic events.  Taking   max dose Ozempic ,  glimepiride  2 mg daily, metformin  2500 mg daily , Jardiance  and telmisartan .  Following a carbohydrate modified diet 6 days per week. Denies numbness, burning and tingling of extremities. Appetite is good.     Review of Symptoms  Patient denies headache, fevers, malaise, unintentional weight loss, skin rash, eye pain, sinus congestion and sinus pain, sore throat, dysphagia,  hemoptysis , cough, dyspnea, wheezing, chest pain, palpitations, orthopnea, edema, abdominal pain, nausea, melena, diarrhea, constipation, flank pain, dysuria, hematuria, urinary  Frequency, nocturia, numbness, tingling, seizures,  Focal weakness, Loss of consciousness,  Tremor, insomnia, depression, anxiety, and suicidal ideation.    Physical Exam:  BP 134/76   Pulse 92   Ht 5' 10 (1.778 m)   Wt 294 lb 6.4 oz (133.5 kg)   SpO2 98%   BMI 42.24 kg/m    Physical Exam Vitals reviewed.  Constitutional:      General: He is not in acute distress.    Appearance: Normal appearance. He is obese. He is not ill-appearing,  toxic-appearing or diaphoretic.  HENT:     Head: Normocephalic and atraumatic.     Right Ear: Tympanic membrane, ear canal and external ear normal. There is no impacted cerumen.     Left Ear: Tympanic membrane, ear canal and external ear normal. There is no impacted cerumen.     Nose: Nose normal.     Mouth/Throat:     Mouth: Mucous membranes are moist.     Pharynx: Oropharynx is clear.  Eyes:     General: No scleral icterus.       Right eye: No discharge.        Left eye: No discharge.     Conjunctiva/sclera: Conjunctivae normal.  Neck:     Thyroid : No thyromegaly.     Vascular: No carotid bruit or JVD.  Cardiovascular:     Rate and Rhythm: Normal rate and regular rhythm.     Heart sounds: Normal heart sounds.  Pulmonary:     Effort: Pulmonary effort is normal. No respiratory distress.     Breath sounds: Normal breath sounds.  Abdominal:     General: Bowel sounds are normal.     Palpations: Abdomen is soft. There is no mass.     Tenderness: There is no abdominal tenderness. There is no guarding or rebound.  Musculoskeletal:        General: Normal range of motion.     Cervical back: Normal range of motion and neck supple.  Lymphadenopathy:     Cervical: No cervical adenopathy.  Skin:    General: Skin is warm and dry.  Neurological:     General: No focal deficit present.     Mental Status: He is alert and oriented to person, place, and time. Mental status is at baseline.  Psychiatric:        Mood and Affect: Mood normal.        Behavior: Behavior normal.        Thought Content: Thought content normal.        Judgment: Judgment normal.     Assessment and Plan: Obesity, diabetes, and hypertension syndrome (HCC) Assessment & Plan: He is tolerating an increased dose of glimepiride  to 2 mg daily without hypoglycemia and is taking the maximum  dose of  Ozempic  , but A1c hs risen slightly,  due to decreased adherence to diet and exercise. Consideration of medication change to  mounjaro for increased dose options discussed but medication  is cost prohibitive at  Tier 4 and he has reservations  about the medication due to unsubstantiated reports of adverse events experienced by an acquaintenance.  No changes today.  encouraged to resume walking . Repeat A1c in 6 months.     Lab Results  Component Value Date   HGBA1C 7.3 (H) 08/18/2023   Lab Results  Component Value Date   CREATININE 1.05 08/18/2023   Lab Results  Component Value Date   MICROALBUR 0.2 06/15/2020   MICROALBUR  05/14/2015    Microalbumin: 12.0, urine creat.: 144.0, uAlb/Creat  Ration: 8.3       Orders: -     Microalbumin / creatinine urine ratio  Vitamin D  deficiency -     Ergocalciferol ; Take 1 capsule (50,000 Units total) by mouth once a week.  Dispense: 12 capsule; Refill: 0  Obstructive sleep apnea Assessment & Plan: Diagnosed by sleep study in 2011.  He  is wearing his CPAP every night a minimum of 6 hours per night and notes improved daytime wakefulness and decreased fatigue  resulting from reduction in AHI to 1.2 /hr    Need for pneumococcal 20-valent conjugate vaccination -     Pneumococcal conjugate vaccine 20-valent  Morbid obesity (HCC) Assessment & Plan: His BMI is > 40 and is complicated by  OSA, Type 2 DM and hypertension.  Continue ozempic , encourage increased participation in regular exercise.    Encounter for preventive health examination Assessment & Plan: age appropriate education and counseling updated, referrals for preventative services and immunizations addressed, dietary and smoking counseling addressed, most recent labs reviewed.  I have personally reviewed and have noted:   1) the patient's medical and social history 2) The pt's use of alcohol, tobacco, and illicit drugs 3) The patient's current medications and supplements 4) Functional ability including ADL's, fall risk, home safety risk, hearing and visual impairment 5) Diet and physical activities 6)  Evidence for depression or mood disorder 7) The patient's height, weight, and BMI have been recorded in the chart     I have made referrals, and provided counseling and education based on review of the above    Primary insomnia Assessment & Plan: Managed with use of lunesta .    Hepatic steatosis Assessment & Plan: Managed with  metformin , ozemipic, and statin.  LFTS normal/ no changes today  Lab Results  Component Value Date   ALT 37 08/18/2023   AST 21 08/18/2023   ALKPHOS 59 08/18/2023   BILITOT 0.5 08/18/2023      Essential hypertension Assessment & Plan: Well controlled on current regimen of amlodipine  10 mg , metoprolol  succinate 25 mg daily, 80 mg  telmisartan  .daily and terazosin  .  No orthostatic symptoms  Renal function stable, no changes today.   Lab Results  Component Value Date   NA 140 08/18/2023   K 4.3 08/18/2023   CL 105 08/18/2023   CO2 28 08/18/2023   Lab Results  Component Value Date   CREATININE 1.05 08/18/2023      Other orders -     amLODIPine  Besylate; Take 1 tablet (10 mg total) by mouth daily.  Dispense: 90 tablet; Refill: 1 -     Empagliflozin ; Take 1 tablet (25 mg total) by mouth at bedtime.  Dispense: 90 tablet; Refill: 1 -     Glimepiride ; Take 1 tablet (1 mg total) by mouth 2 (two) times daily.  Dispense: 180 tablet; Refill: 1 -     Rosuvastatin  Calcium ; Take 1 tablet (10 mg total) by mouth every other day.  Dispense: 45 tablet; Refill: 1 -     Ozempic  (2 MG/DOSE); Inject 2 mg into the skin once a week.  Dispense: 9 mL; Refill: 1    Return in about 6 months (around 02/26/2024) for follow up diabetes.  Verneita LITTIE Kettering, MD

## 2023-08-26 NOTE — Assessment & Plan Note (Addendum)
 He is tolerating an increased dose of glimepiride  to 2 mg daily without hypoglycemia and is taking the maximum  dose of  Ozempic  , but A1c hs risen slightly,  due to decreased adherence to diet and exercise. Consideration of medication change to mounjaro for increased dose options discussed but medication  is cost prohibitive at  Tier 4 and he has reservations  about the medication due to unsubstantiated reports of adverse events experienced by an acquaintenance.  No changes today.  encouraged to resume walking . Repeat A1c in 6 months.     Lab Results  Component Value Date   HGBA1C 7.3 (H) 08/18/2023   Lab Results  Component Value Date   CREATININE 1.05 08/18/2023   Lab Results  Component Value Date   MICROALBUR 0.2 06/15/2020   MICROALBUR  05/14/2015    Microalbumin: 12.0, urine creat.: 144.0, uAlb/Creat Ration: 8.3

## 2023-08-26 NOTE — Patient Instructions (Signed)
 You received YOUR LAST PNEUMONIA VACCINE .SABRASABRA EVER.....  (PREVNAR 20)  STOP CHECKING FASTING SUGARS.  DO A FEW 2 HR POST PRANDIALS AFTER VARIOUS MEALS INCLUDING CEREAL

## 2023-08-27 ENCOUNTER — Other Ambulatory Visit (HOSPITAL_COMMUNITY): Payer: Self-pay

## 2023-08-27 LAB — MICROALBUMIN / CREATININE URINE RATIO
Creatinine,U: 114.5 mg/dL
Microalb Creat Ratio: 10.2 mg/g (ref 0.0–30.0)
Microalb, Ur: 1.2 mg/dL (ref 0.0–1.9)

## 2023-08-27 NOTE — Assessment & Plan Note (Signed)
 Managed with  metformin , ozemipic, and statin.  LFTS normal/ no changes today  Lab Results  Component Value Date   ALT 37 08/18/2023   AST 21 08/18/2023   ALKPHOS 59 08/18/2023   BILITOT 0.5 08/18/2023

## 2023-08-27 NOTE — Assessment & Plan Note (Signed)
 Well controlled on current regimen of amlodipine  10 mg , metoprolol  succinate 25 mg daily, 80 mg  telmisartan  .daily and terazosin  .  No orthostatic symptoms  Renal function stable, no changes today.   Lab Results  Component Value Date   NA 140 08/18/2023   K 4.3 08/18/2023   CL 105 08/18/2023   CO2 28 08/18/2023   Lab Results  Component Value Date   CREATININE 1.05 08/18/2023

## 2023-08-27 NOTE — Assessment & Plan Note (Signed)

## 2023-08-27 NOTE — Assessment & Plan Note (Signed)
 Managed with use of lunesta .

## 2023-08-27 NOTE — Assessment & Plan Note (Signed)
 His BMI is > 40 and is complicated by  OSA, Type 2 DM and hypertension.  Continue ozempic , encourage increased participation in regular exercise.

## 2023-08-28 ENCOUNTER — Ambulatory Visit: Payer: Self-pay | Admitting: Internal Medicine

## 2023-09-02 ENCOUNTER — Other Ambulatory Visit: Payer: Self-pay

## 2023-09-02 ENCOUNTER — Other Ambulatory Visit (HOSPITAL_COMMUNITY): Payer: Self-pay

## 2023-09-13 ENCOUNTER — Other Ambulatory Visit (HOSPITAL_COMMUNITY): Payer: Self-pay

## 2023-09-21 ENCOUNTER — Encounter: Payer: Self-pay | Admitting: Internal Medicine

## 2023-09-21 DIAGNOSIS — Z23 Encounter for immunization: Secondary | ICD-10-CM

## 2023-09-22 NOTE — Telephone Encounter (Signed)
 Are you okay with sending in a rx for the pfizer covid vaccine? If so I will send this in.

## 2023-09-23 MED ORDER — PFIZER-BIONT COVID-19 VAC-TRIS 30 MCG/0.3ML IM SUSP: 0.3000 mL | Freq: Once | INTRAMUSCULAR | 0 refills | Status: AC

## 2023-09-28 ENCOUNTER — Other Ambulatory Visit (HOSPITAL_COMMUNITY): Payer: Self-pay

## 2023-09-28 ENCOUNTER — Other Ambulatory Visit: Payer: Self-pay

## 2023-09-30 ENCOUNTER — Other Ambulatory Visit (HOSPITAL_COMMUNITY): Payer: Self-pay

## 2023-10-05 ENCOUNTER — Other Ambulatory Visit (HOSPITAL_COMMUNITY): Payer: Self-pay

## 2023-10-16 ENCOUNTER — Other Ambulatory Visit (HOSPITAL_COMMUNITY): Payer: Self-pay

## 2023-10-22 ENCOUNTER — Encounter: Payer: Self-pay | Admitting: Internal Medicine

## 2023-10-22 DIAGNOSIS — E119 Type 2 diabetes mellitus without complications: Secondary | ICD-10-CM

## 2023-10-22 DIAGNOSIS — E785 Hyperlipidemia, unspecified: Secondary | ICD-10-CM

## 2023-10-29 ENCOUNTER — Other Ambulatory Visit: Payer: Self-pay

## 2023-11-05 ENCOUNTER — Other Ambulatory Visit (HOSPITAL_COMMUNITY): Payer: Self-pay

## 2023-11-18 ENCOUNTER — Other Ambulatory Visit: Payer: Self-pay

## 2023-11-18 ENCOUNTER — Other Ambulatory Visit (HOSPITAL_COMMUNITY): Payer: Self-pay

## 2023-11-24 ENCOUNTER — Other Ambulatory Visit (HOSPITAL_COMMUNITY): Payer: Self-pay

## 2023-11-24 ENCOUNTER — Other Ambulatory Visit: Payer: Self-pay

## 2023-11-24 ENCOUNTER — Other Ambulatory Visit: Payer: Self-pay | Admitting: Internal Medicine

## 2023-11-25 ENCOUNTER — Other Ambulatory Visit (HOSPITAL_COMMUNITY): Payer: Self-pay

## 2023-11-25 MED ORDER — ESZOPICLONE 2 MG PO TABS
2.0000 mg | ORAL_TABLET | Freq: Every evening | ORAL | 5 refills | Status: AC | PRN
  Filled 2023-11-25 – 2023-11-26 (×2): qty 30, 30d supply, fill #0
  Filled 2023-12-24 – 2023-12-25 (×2): qty 30, 30d supply, fill #1
  Filled 2024-01-27: qty 30, 30d supply, fill #2

## 2023-11-26 ENCOUNTER — Other Ambulatory Visit: Payer: Self-pay

## 2023-12-07 ENCOUNTER — Other Ambulatory Visit (HOSPITAL_COMMUNITY): Payer: Self-pay

## 2023-12-07 ENCOUNTER — Other Ambulatory Visit: Payer: Self-pay | Admitting: Internal Medicine

## 2023-12-08 ENCOUNTER — Other Ambulatory Visit: Payer: Self-pay

## 2023-12-08 ENCOUNTER — Other Ambulatory Visit (HOSPITAL_COMMUNITY): Payer: Self-pay

## 2023-12-08 MED ORDER — TELMISARTAN 80 MG PO TABS
80.0000 mg | ORAL_TABLET | Freq: Every day | ORAL | 1 refills | Status: AC
  Filled 2023-12-08: qty 90, 90d supply, fill #0
  Filled 2023-12-24: qty 90, 90d supply, fill #1

## 2023-12-17 ENCOUNTER — Telehealth: Admitting: Physician Assistant

## 2023-12-17 DIAGNOSIS — R300 Dysuria: Secondary | ICD-10-CM

## 2023-12-17 NOTE — Progress Notes (Signed)
 E-Visit for Urinary Problems  Based on your questionnaire answers, Male UTI symptoms are not treated via virtual care. For the most appropriate care, it is  recommend that you be seen for a face to face office visit.    Male bladder infections are not very common.  We worry about prostate or kidney conditions.  The standard of care is to examine the abdomen and kidneys, and to do a urine and blood test to make sure that something more serious is not going on.  We recommend that you see a provider today.  If your doctor's office is closed [place] has the following Urgent Cares:   NOTE: There will be NO CHARGE for this E-Visit   If you are having a true medical emergency, please call 911.     For an urgent face to face visit, [facility] has multiple urgent care centers for your convenience.  Click the link below for the full list of locations and hours, walk-in wait times, appointment scheduling options and driving directions:  Urgent Care - [place], [place], [place], [place], [place], [place]  [place]     Your MyChart E-visit questionnaire answers were reviewed by a board certified advanced clinical practitioner to complete your personal care plan based on your specific symptoms.  Thank you for using e-Visits.

## 2023-12-18 NOTE — Progress Notes (Signed)
 Christopher Stewart                                          MRN: [id number]   12/18/2023   The VBCI Quality Team Specialist reviewed this patient medical record for the purposes of chart review for care gap closure. The following were reviewed: abstraction for care gap closure-kidney health evaluation for diabetes:eGFR  and uACR.    VBCI Quality Team

## 2023-12-24 ENCOUNTER — Other Ambulatory Visit: Payer: Self-pay

## 2023-12-24 ENCOUNTER — Other Ambulatory Visit (HOSPITAL_COMMUNITY): Payer: Self-pay

## 2024-01-04 ENCOUNTER — Other Ambulatory Visit (HOSPITAL_COMMUNITY): Payer: Self-pay

## 2024-01-04 ENCOUNTER — Other Ambulatory Visit: Payer: Self-pay | Admitting: Internal Medicine

## 2024-01-04 ENCOUNTER — Other Ambulatory Visit: Payer: Self-pay

## 2024-01-04 MED ORDER — CELECOXIB 200 MG PO CAPS
200.0000 mg | ORAL_CAPSULE | Freq: Two times a day (BID) | ORAL | 1 refills | Status: AC
  Filled 2024-01-04: qty 180, 90d supply, fill #0

## 2024-01-11 ENCOUNTER — Other Ambulatory Visit (HOSPITAL_COMMUNITY): Payer: Self-pay

## 2024-01-11 ENCOUNTER — Other Ambulatory Visit: Payer: Self-pay | Admitting: Internal Medicine

## 2024-01-11 DIAGNOSIS — E559 Vitamin D deficiency, unspecified: Secondary | ICD-10-CM

## 2024-01-12 ENCOUNTER — Other Ambulatory Visit (HOSPITAL_COMMUNITY): Payer: Self-pay

## 2024-01-12 ENCOUNTER — Encounter: Payer: Self-pay | Admitting: Internal Medicine

## 2024-01-12 ENCOUNTER — Other Ambulatory Visit: Payer: Self-pay

## 2024-01-12 DIAGNOSIS — E559 Vitamin D deficiency, unspecified: Secondary | ICD-10-CM | POA: Insufficient documentation

## 2024-01-12 MED ORDER — ERGOCALCIFEROL 1.25 MG (50000 UT) PO CAPS
50000.0000 [IU] | ORAL_CAPSULE | ORAL | 0 refills | Status: AC
  Filled 2024-01-12: qty 12, 84d supply, fill #0

## 2024-01-27 ENCOUNTER — Other Ambulatory Visit: Payer: Self-pay

## 2024-02-03 ENCOUNTER — Other Ambulatory Visit: Payer: Self-pay

## 2024-02-11 ENCOUNTER — Other Ambulatory Visit: Payer: Self-pay | Admitting: Internal Medicine

## 2024-02-11 ENCOUNTER — Other Ambulatory Visit: Payer: Self-pay

## 2024-02-11 ENCOUNTER — Telehealth (HOSPITAL_COMMUNITY): Payer: Self-pay

## 2024-02-11 MED ORDER — OZEMPIC (2 MG/DOSE) 8 MG/3ML ~~LOC~~ SOPN
2.0000 mg | PEN_INJECTOR | SUBCUTANEOUS | 1 refills | Status: AC
  Filled 2024-02-11: qty 9, 84d supply, fill #0

## 2024-02-12 ENCOUNTER — Other Ambulatory Visit (HOSPITAL_COMMUNITY): Payer: Self-pay

## 2024-02-12 ENCOUNTER — Other Ambulatory Visit: Payer: Self-pay

## 2024-02-12 ENCOUNTER — Telehealth (HOSPITAL_COMMUNITY): Payer: Self-pay

## 2024-02-12 NOTE — Telephone Encounter (Signed)
 Pharmacy Patient Advocate Encounter  Received notification from [employer] that Prior Authorization for Ozempic  (2 MG/DOSE) 8MG /3ML pen-injectors  has been APPROVED from 02/12/24 to 02/11/25. Ran test claim, Copay is $0. This test claim was processed through [facility]- copay amounts may vary at other pharmacies due to pharmacy/plan contracts, or as the patient moves through the different stages of their insurance plan.   PA #/Case ID/Reference #: [id number]

## 2024-02-12 NOTE — Telephone Encounter (Signed)
 Pharmacy Patient Advocate Encounter   Received notification from Pt Calls Messages that prior authorization for Ozempic  (2 MG/DOSE) 8MG /3ML pen-injectors  is required/requested.   Insurance verification completed.   The patient is insured through [facility] ADVANTAGE/RX ADVANCE.   Per test claim: PA required; PA submitted to above mentioned insurance via Latent Key/confirmation #/EOC [facility] Status is pending

## 2024-02-12 NOTE — Telephone Encounter (Signed)
 PA request has been Received. New Encounter has been or will be created for follow up. For additional info see Pharmacy Prior Auth telephone encounter from 02/12/24.

## 2024-02-16 ENCOUNTER — Other Ambulatory Visit

## 2024-02-16 DIAGNOSIS — E119 Type 2 diabetes mellitus without complications: Secondary | ICD-10-CM

## 2024-02-16 DIAGNOSIS — E785 Hyperlipidemia, unspecified: Secondary | ICD-10-CM | POA: Diagnosis not present

## 2024-02-16 DIAGNOSIS — E669 Obesity, unspecified: Secondary | ICD-10-CM | POA: Diagnosis not present

## 2024-02-16 DIAGNOSIS — I10 Essential (primary) hypertension: Secondary | ICD-10-CM | POA: Diagnosis not present

## 2024-02-16 LAB — LIPID PANEL
Cholesterol: 92 mg/dL (ref 28–200)
HDL: 23.3 mg/dL — ABNORMAL LOW
LDL Cholesterol: 48 mg/dL (ref 10–99)
NonHDL: 69.12
Total CHOL/HDL Ratio: 4
Triglycerides: 104 mg/dL (ref 10.0–149.0)
VLDL: 20.8 mg/dL (ref 0.0–40.0)

## 2024-02-16 LAB — HEMOGLOBIN A1C: Hgb A1c MFr Bld: 7.1 % — ABNORMAL HIGH (ref 4.6–6.5)

## 2024-02-16 LAB — MICROALBUMIN / CREATININE URINE RATIO
Creatinine,U: 76.2 mg/dL
Microalb Creat Ratio: UNDETERMINED mg/g (ref 0.0–30.0)
Microalb, Ur: <0.7 mg/dL

## 2024-02-16 LAB — COMPREHENSIVE METABOLIC PANEL WITH GFR
ALT: 36 U/L (ref 3–53)
AST: 23 U/L (ref 5–37)
Albumin: 3.8 g/dL (ref 3.5–5.2)
Alkaline Phosphatase: 65 U/L (ref 39–117)
BUN: 15 mg/dL (ref 6–23)
CO2: 26 meq/L (ref 19–32)
Calcium: 9.5 mg/dL (ref 8.4–10.5)
Chloride: 103 meq/L (ref 96–112)
Creatinine, Ser: 1.09 mg/dL (ref 0.40–1.50)
GFR: 70.03 mL/min
Glucose, Bld: 95 mg/dL (ref 70–99)
Potassium: 4.7 meq/L (ref 3.5–5.1)
Sodium: 138 meq/L (ref 135–145)
Total Bilirubin: 0.6 mg/dL (ref 0.2–1.2)
Total Protein: 6.7 g/dL (ref 6.0–8.3)

## 2024-02-16 LAB — LDL CHOLESTEROL, DIRECT: Direct LDL: 56 mg/dL

## 2024-02-17 ENCOUNTER — Ambulatory Visit: Payer: Self-pay | Admitting: Internal Medicine

## 2024-03-01 ENCOUNTER — Ambulatory Visit: Admitting: Internal Medicine
